# Patient Record
Sex: Female | Born: 1950 | Race: White | Hispanic: No | Marital: Married | Smoking: Never smoker
Health system: Southern US, Community
[De-identification: ages and names within clinical notes are randomized; demographics above are authoritative.]

## PROBLEM LIST (undated history)

## (undated) DIAGNOSIS — F329 Major depressive disorder, single episode, unspecified: Secondary | ICD-10-CM

## (undated) DIAGNOSIS — E785 Hyperlipidemia, unspecified: Secondary | ICD-10-CM

## (undated) DIAGNOSIS — I872 Venous insufficiency (chronic) (peripheral): Secondary | ICD-10-CM

## (undated) DIAGNOSIS — R51 Headache: Secondary | ICD-10-CM

## (undated) DIAGNOSIS — F32A Depression, unspecified: Secondary | ICD-10-CM

## (undated) DIAGNOSIS — R519 Headache, unspecified: Secondary | ICD-10-CM

## (undated) DIAGNOSIS — R32 Unspecified urinary incontinence: Secondary | ICD-10-CM

## (undated) DIAGNOSIS — D649 Anemia, unspecified: Secondary | ICD-10-CM

## (undated) DIAGNOSIS — T8859XA Other complications of anesthesia, initial encounter: Secondary | ICD-10-CM

## (undated) DIAGNOSIS — H409 Unspecified glaucoma: Secondary | ICD-10-CM

## (undated) DIAGNOSIS — K5909 Other constipation: Secondary | ICD-10-CM

## (undated) DIAGNOSIS — M8580 Other specified disorders of bone density and structure, unspecified site: Secondary | ICD-10-CM

## (undated) DIAGNOSIS — I209 Angina pectoris, unspecified: Secondary | ICD-10-CM

## (undated) DIAGNOSIS — R0600 Dyspnea, unspecified: Secondary | ICD-10-CM

## (undated) DIAGNOSIS — K219 Gastro-esophageal reflux disease without esophagitis: Secondary | ICD-10-CM

## (undated) DIAGNOSIS — T4145XA Adverse effect of unspecified anesthetic, initial encounter: Secondary | ICD-10-CM

## (undated) DIAGNOSIS — M25562 Pain in left knee: Secondary | ICD-10-CM

## (undated) DIAGNOSIS — I341 Nonrheumatic mitral (valve) prolapse: Secondary | ICD-10-CM

## (undated) DIAGNOSIS — E559 Vitamin D deficiency, unspecified: Secondary | ICD-10-CM

## (undated) HISTORY — PX: BLADDER SUSPENSION: SHX72

## (undated) HISTORY — PX: OTHER SURGICAL HISTORY: SHX169

## (undated) HISTORY — DX: Pain in left knee: M25.562

## (undated) HISTORY — DX: Anemia, unspecified: D64.9

## (undated) HISTORY — DX: Nonrheumatic mitral (valve) prolapse: I34.1

## (undated) HISTORY — DX: Other specified disorders of bone density and structure, unspecified site: M85.80

## (undated) HISTORY — DX: Depression, unspecified: F32.A

## (undated) HISTORY — DX: Other constipation: K59.09

## (undated) HISTORY — PX: APPENDECTOMY: SHX54

## (undated) HISTORY — DX: Hyperlipidemia, unspecified: E78.5

## (undated) HISTORY — DX: Venous insufficiency (chronic) (peripheral): I87.2

## (undated) HISTORY — PX: BACK SURGERY: SHX140

## (undated) HISTORY — DX: Vitamin D deficiency, unspecified: E55.9

## (undated) HISTORY — PX: ABDOMINAL HYSTERECTOMY: SHX81

## (undated) HISTORY — DX: Unspecified urinary incontinence: R32

## (undated) HISTORY — DX: Major depressive disorder, single episode, unspecified: F32.9

## (undated) HISTORY — DX: Unspecified glaucoma: H40.9

## (undated) HISTORY — PX: JOINT REPLACEMENT: SHX530

## (undated) HISTORY — PX: TUBAL LIGATION: SHX77

---

## 1969-12-04 HISTORY — PX: APPENDECTOMY: SHX54

## 1978-12-04 HISTORY — PX: TUBAL LIGATION: SHX77

## 1983-12-05 HISTORY — PX: ABDOMINAL HYSTERECTOMY: SHX81

## 1993-12-04 HISTORY — PX: LUMBAR DISC SURGERY: SHX700

## 2007-04-17 ENCOUNTER — Ambulatory Visit: Payer: Self-pay | Admitting: Gastroenterology

## 2008-04-02 ENCOUNTER — Ambulatory Visit: Payer: Self-pay | Admitting: Obstetrics and Gynecology

## 2008-05-12 ENCOUNTER — Encounter: Payer: Self-pay | Admitting: Unknown Physician Specialty

## 2008-06-03 ENCOUNTER — Encounter: Payer: Self-pay | Admitting: Unknown Physician Specialty

## 2009-04-07 ENCOUNTER — Ambulatory Visit: Payer: Self-pay | Admitting: Obstetrics and Gynecology

## 2009-04-15 ENCOUNTER — Ambulatory Visit: Payer: Self-pay | Admitting: Obstetrics and Gynecology

## 2009-10-19 ENCOUNTER — Ambulatory Visit: Payer: Self-pay | Admitting: Obstetrics and Gynecology

## 2010-04-21 ENCOUNTER — Ambulatory Visit: Payer: Self-pay | Admitting: Obstetrics and Gynecology

## 2011-05-02 ENCOUNTER — Ambulatory Visit: Payer: Self-pay | Admitting: Obstetrics and Gynecology

## 2012-05-14 ENCOUNTER — Ambulatory Visit: Payer: Self-pay | Admitting: Family Medicine

## 2012-06-14 ENCOUNTER — Ambulatory Visit: Payer: Self-pay | Admitting: Gastroenterology

## 2012-06-14 LAB — HM COLONOSCOPY: HM COLON: NORMAL

## 2012-11-07 ENCOUNTER — Ambulatory Visit: Payer: Self-pay | Admitting: Family Medicine

## 2012-11-07 LAB — HM DEXA SCAN

## 2012-12-27 ENCOUNTER — Ambulatory Visit: Payer: Self-pay | Admitting: Urology

## 2013-01-23 ENCOUNTER — Ambulatory Visit: Payer: Self-pay | Admitting: Pain Medicine

## 2013-02-06 ENCOUNTER — Ambulatory Visit: Payer: Self-pay | Admitting: Pain Medicine

## 2013-09-11 ENCOUNTER — Ambulatory Visit: Payer: Self-pay | Admitting: Pain Medicine

## 2013-10-01 ENCOUNTER — Ambulatory Visit: Payer: Self-pay | Admitting: Pain Medicine

## 2013-10-02 ENCOUNTER — Ambulatory Visit: Payer: Self-pay | Admitting: Pain Medicine

## 2013-10-20 ENCOUNTER — Ambulatory Visit: Payer: Self-pay | Admitting: Pain Medicine

## 2014-03-17 ENCOUNTER — Ambulatory Visit: Payer: Self-pay | Admitting: Family Medicine

## 2014-03-17 LAB — HM MAMMOGRAPHY: HM Mammogram: NORMAL

## 2014-09-14 DIAGNOSIS — M1710 Unilateral primary osteoarthritis, unspecified knee: Secondary | ICD-10-CM | POA: Insufficient documentation

## 2014-09-14 DIAGNOSIS — M179 Osteoarthritis of knee, unspecified: Secondary | ICD-10-CM | POA: Insufficient documentation

## 2015-04-05 ENCOUNTER — Other Ambulatory Visit: Payer: Self-pay

## 2015-04-05 DIAGNOSIS — Z1231 Encounter for screening mammogram for malignant neoplasm of breast: Secondary | ICD-10-CM

## 2015-04-07 LAB — LIPID PANEL
CHOLESTEROL: 261 mg/dL — AB (ref 0–200)
HDL: 70 mg/dL (ref 35–70)
LDL Cholesterol: 170 mg/dL
Triglycerides: 103 mg/dL (ref 40–160)

## 2015-04-22 ENCOUNTER — Ambulatory Visit
Admission: RE | Admit: 2015-04-22 | Discharge: 2015-04-22 | Disposition: A | Discharge status: Home / Self Care | Payer: Medicare Other | Source: Ambulatory Visit | Attending: Family Medicine | Admitting: Family Medicine

## 2015-04-22 DIAGNOSIS — Z1231 Encounter for screening mammogram for malignant neoplasm of breast: Secondary | ICD-10-CM | POA: Diagnosis not present

## 2015-04-28 ENCOUNTER — Other Ambulatory Visit: Payer: Self-pay | Admitting: Family Medicine

## 2015-04-28 DIAGNOSIS — E2839 Other primary ovarian failure: Secondary | ICD-10-CM

## 2015-05-11 ENCOUNTER — Other Ambulatory Visit: Payer: Self-pay | Admitting: Orthopedic Surgery

## 2015-05-11 DIAGNOSIS — M25462 Effusion, left knee: Secondary | ICD-10-CM

## 2015-05-11 DIAGNOSIS — M1712 Unilateral primary osteoarthritis, left knee: Secondary | ICD-10-CM

## 2015-05-18 ENCOUNTER — Telehealth: Payer: Self-pay | Admitting: Family Medicine

## 2015-05-18 NOTE — Telephone Encounter (Signed)
Patient was seen last month for her annual physical. She is still waiting on her referral for her bone density. Please return call [phone]

## 2015-05-18 NOTE — Telephone Encounter (Signed)
Notified patient Bone density is scheduled on 05/24/15 at 11:00am at [facility]

## 2015-05-19 ENCOUNTER — Ambulatory Visit
Admission: RE | Admit: 2015-05-19 | Discharge: 2015-05-19 | Disposition: A | Discharge status: Home / Self Care | Payer: Medicare Other | Source: Ambulatory Visit | Attending: Orthopedic Surgery | Admitting: Orthopedic Surgery

## 2015-05-19 ENCOUNTER — Ambulatory Visit: Payer: Medicare Other

## 2015-05-19 DIAGNOSIS — M25562 Pain in left knee: Secondary | ICD-10-CM | POA: Diagnosis present

## 2015-05-19 DIAGNOSIS — M1712 Unilateral primary osteoarthritis, left knee: Secondary | ICD-10-CM

## 2015-05-19 DIAGNOSIS — M25462 Effusion, left knee: Secondary | ICD-10-CM

## 2015-05-24 ENCOUNTER — Telehealth: Payer: Self-pay

## 2015-05-24 ENCOUNTER — Ambulatory Visit
Admission: RE | Admit: 2015-05-24 | Discharge: 2015-05-24 | Disposition: A | Discharge status: Home / Self Care | Payer: Medicare Other | Source: Ambulatory Visit | Attending: Family Medicine | Admitting: Family Medicine

## 2015-05-24 DIAGNOSIS — M8580 Other specified disorders of bone density and structure, unspecified site: Secondary | ICD-10-CM | POA: Diagnosis not present

## 2015-05-24 DIAGNOSIS — E2839 Other primary ovarian failure: Secondary | ICD-10-CM

## 2015-05-24 NOTE — Telephone Encounter (Signed)
-----   Message from Steele Sizer, MD sent at 05/24/2015  1:28 PM EDT ----- Stable osteopenia, continue medication

## 2015-05-24 NOTE — Telephone Encounter (Signed)
Patient notified

## 2015-06-03 ENCOUNTER — Ambulatory Visit
Admission: RE | Admit: 2015-06-03 | Discharge: 2015-06-03 | Disposition: A | Discharge status: Home / Self Care | Payer: Medicare Other | Source: Ambulatory Visit | Attending: Physician Assistant | Admitting: Physician Assistant

## 2015-06-03 ENCOUNTER — Other Ambulatory Visit: Payer: Self-pay | Admitting: Physician Assistant

## 2015-06-03 DIAGNOSIS — R059 Cough, unspecified: Secondary | ICD-10-CM

## 2015-06-03 DIAGNOSIS — R05 Cough: Secondary | ICD-10-CM

## 2015-06-25 ENCOUNTER — Other Ambulatory Visit: Payer: Self-pay

## 2015-06-27 MED ORDER — IBANDRONATE SODIUM 150 MG PO TABS: 150.0000 mg | ORAL_TABLET | ORAL | Status: DC

## 2015-09-16 ENCOUNTER — Encounter: Payer: Self-pay | Admitting: Physician Assistant

## 2015-09-16 ENCOUNTER — Ambulatory Visit: Payer: Self-pay | Admitting: Physician Assistant

## 2015-09-16 VITALS — BP 125/80 | HR 74 | Temp 97.8°F

## 2015-09-16 DIAGNOSIS — H6981 Other specified disorders of Eustachian tube, right ear: Secondary | ICD-10-CM

## 2015-09-16 DIAGNOSIS — Z299 Encounter for prophylactic measures, unspecified: Secondary | ICD-10-CM

## 2015-09-16 MED ORDER — FLUTICASONE PROPIONATE 50 MCG/ACT NA SUSP: 2.0000 | Freq: Every day | NASAL | Status: DC

## 2015-09-16 NOTE — Progress Notes (Signed)
S:  C/o right ear pain,  popping and being stopped up, no drainage from ears, no fever/chills, no cough or congestion, some sinus pressure, remainder ros neg Using otc meds without relief  O:  Vitals wnl, nad, tms dull b/l, nasal mucosa swollen, throat wnl, neck supple no lymph, lungs c t a, cv rrr, neuro intact  A: acute eustachean tube dysfunction  P: flonase, sudafed, flu vaccine given today, return if not improving in 3 to 5 days, return earlier if worsening, can call in antibiotic next week if worsening

## 2015-09-17 ENCOUNTER — Ambulatory Visit: Payer: Medicare Other | Admitting: Physician Assistant

## 2015-10-25 ENCOUNTER — Other Ambulatory Visit: Payer: Self-pay | Admitting: Emergency Medicine

## 2015-10-25 ENCOUNTER — Other Ambulatory Visit: Payer: Self-pay

## 2015-10-25 DIAGNOSIS — K219 Gastro-esophageal reflux disease without esophagitis: Secondary | ICD-10-CM

## 2015-10-25 NOTE — Telephone Encounter (Signed)
Received a faxed medication request from [facility].  Please advise.  Thank you. 

## 2015-10-25 NOTE — Telephone Encounter (Signed)
Patient requesting refill. 

## 2015-10-26 MED ORDER — OMEPRAZOLE 20 MG PO CPDR: 20.0000 mg | DELAYED_RELEASE_CAPSULE | Freq: Every day | ORAL | Status: DC

## 2015-10-26 NOTE — Telephone Encounter (Signed)
Med refill approved 

## 2015-10-27 MED ORDER — OXYBUTYNIN CHLORIDE ER 10 MG PO TB24: 10.0000 mg | ORAL_TABLET | Freq: Every day | ORAL | Status: DC

## 2015-10-27 NOTE — Telephone Encounter (Signed)
Appointment made for 11/17/15 and patient informed of prescription being sent to the pharmacy

## 2015-11-17 ENCOUNTER — Encounter: Payer: Self-pay | Admitting: Family Medicine

## 2015-11-17 ENCOUNTER — Ambulatory Visit (INDEPENDENT_AMBULATORY_CARE_PROVIDER_SITE_OTHER): Payer: Medicare Other | Admitting: Family Medicine

## 2015-11-17 VITALS — BP 104/68 | HR 88 | Temp 97.7°F | Resp 14 | Ht 62.0 in | Wt 152.7 lb

## 2015-11-17 DIAGNOSIS — K5900 Constipation, unspecified: Secondary | ICD-10-CM

## 2015-11-17 DIAGNOSIS — E785 Hyperlipidemia, unspecified: Secondary | ICD-10-CM | POA: Diagnosis not present

## 2015-11-17 DIAGNOSIS — N3941 Urge incontinence: Secondary | ICD-10-CM

## 2015-11-17 DIAGNOSIS — M1712 Unilateral primary osteoarthritis, left knee: Secondary | ICD-10-CM | POA: Diagnosis not present

## 2015-11-17 DIAGNOSIS — M8580 Other specified disorders of bone density and structure, unspecified site: Secondary | ICD-10-CM | POA: Insufficient documentation

## 2015-11-17 DIAGNOSIS — Z79899 Other long term (current) drug therapy: Secondary | ICD-10-CM

## 2015-11-17 DIAGNOSIS — M5432 Sciatica, left side: Secondary | ICD-10-CM | POA: Insufficient documentation

## 2015-11-17 DIAGNOSIS — E559 Vitamin D deficiency, unspecified: Secondary | ICD-10-CM | POA: Diagnosis not present

## 2015-11-17 DIAGNOSIS — K5909 Other constipation: Secondary | ICD-10-CM

## 2015-11-17 DIAGNOSIS — N62 Hypertrophy of breast: Secondary | ICD-10-CM | POA: Insufficient documentation

## 2015-11-17 DIAGNOSIS — I341 Nonrheumatic mitral (valve) prolapse: Secondary | ICD-10-CM | POA: Insufficient documentation

## 2015-11-17 DIAGNOSIS — G43009 Migraine without aura, not intractable, without status migrainosus: Secondary | ICD-10-CM | POA: Diagnosis not present

## 2015-11-17 DIAGNOSIS — Z1159 Encounter for screening for other viral diseases: Secondary | ICD-10-CM | POA: Diagnosis not present

## 2015-11-17 DIAGNOSIS — K219 Gastro-esophageal reflux disease without esophagitis: Secondary | ICD-10-CM

## 2015-11-17 DIAGNOSIS — H6980 Other specified disorders of Eustachian tube, unspecified ear: Secondary | ICD-10-CM | POA: Insufficient documentation

## 2015-11-17 DIAGNOSIS — Z981 Arthrodesis status: Secondary | ICD-10-CM | POA: Insufficient documentation

## 2015-11-17 DIAGNOSIS — I872 Venous insufficiency (chronic) (peripheral): Secondary | ICD-10-CM | POA: Insufficient documentation

## 2015-11-17 DIAGNOSIS — Z780 Asymptomatic menopausal state: Secondary | ICD-10-CM | POA: Insufficient documentation

## 2015-11-17 DIAGNOSIS — H6983 Other specified disorders of Eustachian tube, bilateral: Secondary | ICD-10-CM | POA: Diagnosis not present

## 2015-11-17 DIAGNOSIS — H6990 Unspecified Eustachian tube disorder, unspecified ear: Secondary | ICD-10-CM | POA: Insufficient documentation

## 2015-11-17 LAB — HM HEPATITIS C SCREENING LAB: HM Hepatitis Screen: NEGATIVE

## 2015-11-17 MED ORDER — DILTIAZEM HCL ER COATED BEADS 120 MG PO CP24: 120.0000 mg | ORAL_CAPSULE | Freq: Every day | ORAL | Status: DC

## 2015-11-17 MED ORDER — POLYETHYLENE GLYCOL 3350 17 GM/SCOOP PO POWD: 17.0000 g | Freq: Every day | ORAL | Status: DC

## 2015-11-17 MED ORDER — SUMATRIPTAN SUCCINATE 100 MG PO TABS: 100.0000 mg | ORAL_TABLET | ORAL | Status: DC | PRN

## 2015-11-17 NOTE — Progress Notes (Signed)
Name: Bonnie Owens   MRN: [id number]    DOB: January 09, 1951   Date:11/17/2015       Progress Note  Subjective  Chief Complaint  Chief Complaint  Patient presents with  . Medication Refill    follow-up  . Migraine    about 2 per month  . Gastroesophageal Reflux    doing good    HPI  MVP: she sees Dr. Ubaldo Glassing once a year. She is on Cardizem, she has constipation as a side effect. Denies palpitation, chest pain or SOB, recently, seldom she has symptoms when she is very tired, less than once a month.   Migraine without: two episodes past few weeks. Pain is described as throbbing on right frontal area, Imitrex works well for her but when she does not take it episode can last one day.   GERD: she went to employee health last month for indigestion and heartburn and was started on Omeprazole, she states she is doing well since started on medication. Reviewed with her possible side effects of long term use of PPI.   Primary OA left knee: causing more pain, currently on Meloxicam, may need surgery next year. Seeing Dr. Marry Guan. Pain is described as a toothache - dull / throbbing pain and occasionally radiates down her left leg.    Patient Active Problem List   Diagnosis Date Noted  . Osteopenia 11/17/2015  . Migraine without aura and without status migrainosus, not intractable 11/17/2015  . Urge urinary incontinence 11/17/2015  . Dyslipidemia 11/17/2015  . Large breasts 11/17/2015  . Menopause 11/17/2015  . Vitamin D deficiency 11/17/2015  . MVP (mitral valve prolapse) 11/17/2015  . History of lumbar fusion 11/17/2015  . Chronic constipation 11/17/2015  . Venous insufficiency 11/17/2015  . GERD (gastroesophageal reflux disease) 11/17/2015  . Sciatica of left side   . Arthritis of knee, degenerative 09/14/2014    Past Surgical History  Procedure Laterality Date  . Abdominal hysterectomy    . Tummy tuck    . Sling procedure    . Appendectomy    . Lumbar descectomy      Family  History  Problem Relation Age of Onset  . Breast cancer Sister 68  . Cancer Sister     breast  . Berenice Primas' disease Sister   . Breast cancer Maternal Grandmother   . Dementia Mother   . Cancer Father   . Thyroid disease Brother     Social History   Social History  . Marital Status: Married    Spouse Name: N/A  . Number of Children: N/A  . Years of Education: N/A   Occupational History  . Not on file.   Social History Main Topics  . Smoking status: Never Smoker   . Smokeless tobacco: Not on file  . Alcohol Use: 0.6 oz/week    0 Standard drinks or equivalent, 1 Glasses of wine per week  . Drug Use: Not on file  . Sexual Activity: Yes   Other Topics Concern  . Not on file   Social History Narrative     Current outpatient prescriptions:  .  polyethylene glycol powder (GLYCOLAX/MIRALAX) powder, Take 17 g by mouth daily., Disp: 850 g, Rfl: 5 .  diltiazem (CARDIZEM CD) 120 MG 24 hr capsule, Take 1 capsule (120 mg total) by mouth daily., Disp: 30 capsule, Rfl: 12 .  fluticasone (FLONASE) 50 MCG/ACT nasal spray, Place 2 sprays into both nostrils daily., Disp: 16 g, Rfl: 6 .  Glucosamine-Chondroitin 250-200 MG TABS, Take  1 tablet by mouth daily., Disp: , Rfl:  .  ibandronate (BONIVA) 150 MG tablet, Take 1 tablet (150 mg total) by mouth every 30 (thirty) days., Disp: 1 tablet, Rfl: 12 .  meloxicam (MOBIC) 15 MG tablet, Take 1 tablet by mouth daily., Disp: , Rfl:  .  Omega-3 Krill Oil 300 MG CAPS, Take 1 tablet by mouth daily., Disp: , Rfl:  .  omeprazole (PRILOSEC) 20 MG capsule, Take 1 capsule (20 mg total) by mouth daily., Disp: 30 capsule, Rfl: 6 .  oxybutynin (DITROPAN XL) 10 MG 24 hr tablet, Take 1 tablet (10 mg total) by mouth at bedtime., Disp: 30 tablet, Rfl: 5 .  SUMAtriptan (IMITREX) 100 MG tablet, Take 1 tablet (100 mg total) by mouth as needed., Disp: 10 tablet, Rfl: 5  No Known Allergies   ROS  Constitutional: Negative for fever , positive for  weight change.   Respiratory: Negative for cough and shortness of breath.   Cardiovascular: Negative for chest pain or palpitations.  Gastrointestinal: Negative for abdominal pain, no bowel changes - chronic constipation .  Musculoskeletal: Negative for gait problem or joint swelling.  Skin: Negative for rash.  Neurological: Negative for dizziness or headache.  No other specific complaints in a complete review of systems (except as listed in HPI above).  Objective  Filed Vitals:   11/17/15 0827  BP: 104/68  Pulse: 88  Temp: 97.7 F (36.5 C)  TempSrc: Oral  Resp: 14  Height: 5\' 2"  (1.575 m)  Weight: 152 lb 11.2 oz (69.264 kg)  SpO2: 97%    Body mass index is 27.92 kg/(m^2).  Physical Exam  Constitutional: Patient appears well-developed and well-nourished. Obese  No distress.  HEENT: head atraumatic, normocephalic, pupils equal and reactive to light, neck supple, throat within normal limits Cardiovascular: Normal rate, regular rhythm and normal heart sounds.  No murmur heard. No BLE edema. Pulmonary/Chest: Effort normal and breath sounds normal. No respiratory distress. Abdominal: Soft.  There is no tenderness. Psychiatric: Patient has a normal mood and affect. behavior is normal. Judgment and thought content normal. Muscular Skeletal: crepitus with extension of left knee, no effusion   PHQ2/9: Depression screen PHQ 2/9 11/17/2015  Decreased Interest 0  Down, Depressed, Hopeless 0  PHQ - 2 Score 0    Fall Risk: Fall Risk  11/17/2015  Falls in the past year? No   Functional Status Survey: Is the patient deaf or have difficulty hearing?: No Does the patient have difficulty seeing, even when wearing glasses/contacts?: Yes (contacts) Does the patient have difficulty concentrating, remembering, or making decisions?: No Does the patient have difficulty walking or climbing stairs?: No Does the patient have difficulty dressing or bathing?: No   Assessment & Plan  1. Migraine without  aura and without status migrainosus, not intractable  - diltiazem (CARDIZEM CD) 120 MG 24 hr capsule; Take 1 capsule (120 mg total) by mouth daily.  Dispense: 30 capsule; Refill: 12 - SUMAtriptan (IMITREX) 100 MG tablet; Take 1 tablet (100 mg total) by mouth as needed.  Dispense: 10 tablet; Refill: 5  2. Chronic constipation  - polyethylene glycol powder (GLYCOLAX/MIRALAX) powder; Take 17 g by mouth daily.  Dispense: 850 g; Refill: 5  3. Primary osteoarthritis of left knee  Continue follow up with Dr. Marry Guan  4. Dyslipidemia  Discussed replacing fish oil, with eating fish twice weekly, eating tree nuts about 100 g every other day - Lipid panel  5. Urge urinary incontinence  stable  6. Vitamin D deficiency  -  VITAMIN D 25 Hydroxy (Vit-D Deficiency, Fractures)  7. Gastroesophageal reflux disease without esophagitis  Discussed life style modification   8. Need for hepatitis C screening test  - Hepatitis C antibody  9. Encounter for long-term (current) use of medications  - Comprehensive metabolic panel

## 2015-11-17 NOTE — Patient Instructions (Signed)
Food Choices for Gastroesophageal Reflux Disease, Adult When you have gastroesophageal reflux disease (GERD), the foods you eat and your eating habits are very important. Choosing the right foods can help ease the discomfort of GERD. WHAT GENERAL GUIDELINES DO I NEED TO FOLLOW?  Choose fruits, vegetables, whole grains, low-fat dairy products, and low-fat meat, fish, and poultry.  Limit fats such as oils, salad dressings, butter, nuts, and avocado.  Keep a food diary to identify foods that cause symptoms.  Avoid foods that cause reflux. These may be different for different people.  Eat frequent small meals instead of three large meals each day.  Eat your meals slowly, in a relaxed setting.  Limit fried foods.  Cook foods using methods other than frying.  Avoid drinking alcohol.  Avoid drinking large amounts of liquids with your meals.  Avoid bending over or lying down until 2-3 hours after eating. WHAT FOODS ARE NOT RECOMMENDED? The following are some foods and drinks that may worsen your symptoms: Vegetables Tomatoes. Tomato juice. Tomato and spaghetti sauce. Chili peppers. Onion and garlic. Horseradish. Fruits Oranges, grapefruit, and lemon (fruit and juice). Meats High-fat meats, fish, and poultry. This includes hot dogs, ribs, ham, sausage, salami, and bacon. Dairy Whole milk and chocolate milk. Sour cream. Cream. Butter. Ice cream. Cream cheese.  Beverages Coffee and tea, with or without caffeine. Carbonated beverages or energy drinks. Condiments Hot sauce. Barbecue sauce.  Sweets/Desserts Chocolate and cocoa. Donuts. Peppermint and spearmint. Fats and Oils High-fat foods, including French fries and potato chips. Other Vinegar. Strong spices, such as black pepper, white pepper, red pepper, cayenne, curry powder, cloves, ginger, and chili powder. The items listed above may not be a complete list of foods and beverages to avoid. Contact your dietitian for more  information.   This information is not intended to replace advice given to you by your health care provider. Make sure you discuss any questions you have with your health care provider.   Document Released: 11/20/2005 Document Revised: 12/11/2014 Document Reviewed: 09/24/2013 Elsevier Interactive Patient Education 2016 Elsevier Inc.  

## 2015-11-18 LAB — LIPID PANEL
CHOLESTEROL TOTAL: 242 mg/dL — AB (ref 100–199)
Chol/HDL Ratio: 4 ratio units (ref 0.0–4.4)
HDL: 61 mg/dL (ref >39)
LDL Calculated: 159 mg/dL — ABNORMAL HIGH (ref 0–99)
TRIGLYCERIDES: 111 mg/dL (ref 0–149)
VLDL Cholesterol Cal: 22 mg/dL (ref 5–40)

## 2015-11-18 LAB — COMPREHENSIVE METABOLIC PANEL
A/G RATIO: 1.8 (ref 1.1–2.5)
ALBUMIN: 4.4 g/dL (ref 3.6–4.8)
ALT: 21 IU/L (ref 0–32)
AST: 22 IU/L (ref 0–40)
Alkaline Phosphatase: 53 IU/L (ref 39–117)
BUN / CREAT RATIO: 33 — AB (ref 11–26)
BUN: 20 mg/dL (ref 8–27)
Bilirubin Total: 0.4 mg/dL (ref 0.0–1.2)
CALCIUM: 9.2 mg/dL (ref 8.7–10.3)
CO2: 26 mmol/L (ref 18–29)
Chloride: 98 mmol/L (ref 96–106)
Creatinine, Ser: 0.6 mg/dL (ref 0.57–1.00)
GFR, EST AFRICAN AMERICAN: 111 mL/min/{1.73_m2} (ref >59)
GFR, EST NON AFRICAN AMERICAN: 96 mL/min/{1.73_m2} (ref >59)
GLOBULIN, TOTAL: 2.4 g/dL (ref 1.5–4.5)
Glucose: 83 mg/dL (ref 65–99)
POTASSIUM: 4.6 mmol/L (ref 3.5–5.2)
SODIUM: 139 mmol/L (ref 134–144)
TOTAL PROTEIN: 6.8 g/dL (ref 6.0–8.5)

## 2015-11-18 LAB — HEPATITIS C ANTIBODY: Hep C Virus Ab: 0.1 s/co ratio (ref 0.0–0.9)

## 2015-11-18 LAB — VITAMIN D 25 HYDROXY (VIT D DEFICIENCY, FRACTURES): Vit D, 25-Hydroxy: 38.1 ng/mL (ref 30.0–100.0)

## 2016-01-10 ENCOUNTER — Encounter: Payer: Self-pay | Admitting: Physician Assistant

## 2016-01-10 ENCOUNTER — Ambulatory Visit: Payer: Self-pay | Admitting: Physician Assistant

## 2016-01-10 VITALS — BP 110/70 | HR 83 | Temp 98.1°F

## 2016-01-10 DIAGNOSIS — J018 Other acute sinusitis: Secondary | ICD-10-CM

## 2016-01-10 MED ORDER — AMOXICILLIN 875 MG PO TABS: 875.0000 mg | ORAL_TABLET | Freq: Two times a day (BID) | ORAL | Status: DC

## 2016-01-10 NOTE — Progress Notes (Signed)
S: C/o runny nose and congestion for 3 days, no fever, chills, cp/sob, v/d; mucus is white and thick, cough is sporadic, c/o of facial and dental pain. Some body aches over the weekend but better now Using otc meds:   O: PE: vitals wnl, nad, perrl eomi, normocephalic, tms dull, nasal mucosa red and swollen, throat injected, neck supple no lymph, lungs c t a, cv rrr, neuro intact  A:  Acute sinusitis   P: amoxil 875mg  bid x 10d, drink fluids, continue regular meds , use otc meds of choice, return if not improving in 5 days, return earlier if worsening

## 2016-02-03 DIAGNOSIS — M1712 Unilateral primary osteoarthritis, left knee: Secondary | ICD-10-CM | POA: Diagnosis not present

## 2016-02-07 ENCOUNTER — Other Ambulatory Visit: Payer: Self-pay | Admitting: Orthopedic Surgery

## 2016-02-07 DIAGNOSIS — M1712 Unilateral primary osteoarthritis, left knee: Secondary | ICD-10-CM

## 2016-02-09 ENCOUNTER — Ambulatory Visit
Admission: RE | Admit: 2016-02-09 | Discharge: 2016-02-09 | Disposition: A | Discharge status: Home / Self Care | Payer: Managed Care, Other (non HMO) | Source: Ambulatory Visit | Attending: Orthopedic Surgery | Admitting: Orthopedic Surgery

## 2016-02-09 DIAGNOSIS — M1612 Unilateral primary osteoarthritis, left hip: Secondary | ICD-10-CM | POA: Insufficient documentation

## 2016-02-09 DIAGNOSIS — M1712 Unilateral primary osteoarthritis, left knee: Secondary | ICD-10-CM | POA: Diagnosis not present

## 2016-02-09 DIAGNOSIS — M25562 Pain in left knee: Secondary | ICD-10-CM | POA: Diagnosis not present

## 2016-02-21 DIAGNOSIS — M1712 Unilateral primary osteoarthritis, left knee: Secondary | ICD-10-CM | POA: Diagnosis not present

## 2016-02-21 DIAGNOSIS — S8001XA Contusion of right knee, initial encounter: Secondary | ICD-10-CM | POA: Diagnosis not present

## 2016-02-23 ENCOUNTER — Encounter
Admission: RE | Admit: 2016-02-23 | Discharge: 2016-02-23 | Disposition: A | Discharge status: Home / Self Care | Payer: Managed Care, Other (non HMO) | Source: Ambulatory Visit | Attending: Orthopedic Surgery | Admitting: Orthopedic Surgery

## 2016-02-23 DIAGNOSIS — M1712 Unilateral primary osteoarthritis, left knee: Secondary | ICD-10-CM | POA: Diagnosis not present

## 2016-02-23 DIAGNOSIS — Z01812 Encounter for preprocedural laboratory examination: Secondary | ICD-10-CM | POA: Diagnosis not present

## 2016-02-23 HISTORY — DX: Headache: R51

## 2016-02-23 HISTORY — DX: Gastro-esophageal reflux disease without esophagitis: K21.9

## 2016-02-23 HISTORY — DX: Headache, unspecified: R51.9

## 2016-02-23 LAB — ABO/RH: ABO/RH(D): A NEG

## 2016-02-23 LAB — URINALYSIS COMPLETE WITH MICROSCOPIC (ARMC ONLY)
BACTERIA UA: NONE SEEN
Bilirubin Urine: NEGATIVE
Glucose, UA: NEGATIVE mg/dL
Hgb urine dipstick: NEGATIVE
Ketones, ur: NEGATIVE mg/dL
Leukocytes, UA: NEGATIVE
Nitrite: NEGATIVE
PROTEIN: NEGATIVE mg/dL
SPECIFIC GRAVITY, URINE: 1.01 (ref 1.005–1.030)
SQUAMOUS EPITHELIAL / LPF: NONE SEEN
pH: 6 (ref 5.0–8.0)

## 2016-02-23 LAB — CBC
HEMATOCRIT: 44.4 % (ref 35.0–47.0)
HEMOGLOBIN: 14.8 g/dL (ref 12.0–16.0)
MCH: 31.4 pg (ref 26.0–34.0)
MCHC: 33.3 g/dL (ref 32.0–36.0)
MCV: 94.4 fL (ref 80.0–100.0)
Platelets: 344 10*3/uL (ref 150–440)
RBC: 4.71 MIL/uL (ref 3.80–5.20)
RDW: 12.9 % (ref 11.5–14.5)
WBC: 7 10*3/uL (ref 3.6–11.0)

## 2016-02-23 LAB — TYPE AND SCREEN
ABO/RH(D): A NEG
ANTIBODY SCREEN: NEGATIVE

## 2016-02-23 LAB — SEDIMENTATION RATE: Sed Rate: 9 mm/hr (ref 0–30)

## 2016-02-23 LAB — APTT: aPTT: 32 seconds (ref 24–36)

## 2016-02-23 LAB — BASIC METABOLIC PANEL
Anion gap: 6 (ref 5–15)
BUN: 16 mg/dL (ref 6–20)
CHLORIDE: 102 mmol/L (ref 101–111)
CO2: 29 mmol/L (ref 22–32)
CREATININE: 0.48 mg/dL (ref 0.44–1.00)
Calcium: 9.1 mg/dL (ref 8.9–10.3)
GFR calc non Af Amer: >60 mL/min (ref >60)
Glucose, Bld: 87 mg/dL (ref 65–99)
POTASSIUM: 3.7 mmol/L (ref 3.5–5.1)
SODIUM: 137 mmol/L (ref 135–145)

## 2016-02-23 LAB — PROTIME-INR
INR: 0.96
PROTHROMBIN TIME: 13 s (ref 11.4–15.0)

## 2016-02-23 LAB — SURGICAL PCR SCREEN
MRSA, PCR: NEGATIVE
STAPHYLOCOCCUS AUREUS: POSITIVE — AB

## 2016-02-23 NOTE — Patient Instructions (Signed)
  Your procedure is scheduled on: Thursday 03/09/16 Report to Day Surgery. 2ND FLOOR MEDICAL MALL ENTRANCE To find out your arrival time please call [phone] between 1PM - 3PM on Wednesday 03/08/16.  Remember: Instructions that are not followed completely may result in serious medical risk, up to and including death, or upon the discretion of your surgeon and anesthesiologist your surgery may need to be rescheduled.    __X__ 1. Do not eat food or drink liquids after midnight. No gum chewing or hard candies.     __X__ 2. No Alcohol for 24 hours before or after surgery.   ____ 3. Bring all medications with you on the day of surgery if instructed.    __X__ 4. Notify your doctor if there is any change in your medical condition     (cold, fever, infections).     Do not wear jewelry, make-up, hairpins, clips or nail polish.  Do not wear lotions, powders, or perfumes.   Do not shave 48 hours prior to surgery. Men may shave face and neck.  Do not bring valuables to the hospital.    [facility] is not responsible for any belongings or valuables.               Contacts, dentures or bridgework may not be worn into surgery.  Leave your suitcase in the car. After surgery it may be brought to your room.  For patients admitted to the hospital, discharge time is determined by your                treatment team.   Patients discharged the day of surgery will not be allowed to drive home.   Please read over the following fact sheets that you were given:   MRSA Information and Surgical Site Infection Prevention   __X__ Take these medicines the morning of surgery with A SIP OF WATER:    1. DILTIAZEM  2. OMEPRAZOLE  3.   4.  5.  6.  ____ Fleet Enema (as directed)   __X__ Use CHG Soap as directed  ____ Use inhalers on the day of surgery  ____ Stop metformin 2 days prior to surgery    ____ Take 1/2 of usual insulin dose the night before surgery and none on the morning of surgery.   __X__  Stop Coumadin/Plavix/aspirin on CALL DR FATH IN REGARDS TO STOPPING YOUR ASPIRIN 10 DAYS PRIOR TO SURGERY  ____ Stop Anti-inflammatories on    __X__ Stop supplements until after surgery. 7 DAYS PRIOR TO SURGERY OSTEO BIFLEX, VITAMIN C  ____ Bring C-Pap to the hospital.

## 2016-02-24 NOTE — Pre-Procedure Instructions (Signed)
Faxed UA PCR results to Dr. Rudene Christians.

## 2016-02-25 LAB — URINE CULTURE: CULTURE: NO GROWTH

## 2016-03-09 ENCOUNTER — Inpatient Hospital Stay: Payer: Managed Care, Other (non HMO) | Admitting: Anesthesiology

## 2016-03-09 ENCOUNTER — Inpatient Hospital Stay
Admission: RE | Admit: 2016-03-09 | Discharge: 2016-03-12 | DRG: 470 | Disposition: A | Discharge status: Home Health | Payer: Managed Care, Other (non HMO) | Source: Ambulatory Visit | Attending: Orthopedic Surgery | Admitting: Orthopedic Surgery

## 2016-03-09 ENCOUNTER — Encounter: Payer: Self-pay | Admitting: *Deleted

## 2016-03-09 ENCOUNTER — Inpatient Hospital Stay: Payer: Managed Care, Other (non HMO)

## 2016-03-09 ENCOUNTER — Encounter
Admission: RE | Disposition: A | Discharge status: Home Health | Payer: Self-pay | Source: Ambulatory Visit | Attending: Orthopedic Surgery

## 2016-03-09 DIAGNOSIS — I872 Venous insufficiency (chronic) (peripheral): Secondary | ICD-10-CM | POA: Diagnosis present

## 2016-03-09 DIAGNOSIS — M8580 Other specified disorders of bone density and structure, unspecified site: Secondary | ICD-10-CM | POA: Diagnosis present

## 2016-03-09 DIAGNOSIS — K5909 Other constipation: Secondary | ICD-10-CM | POA: Diagnosis not present

## 2016-03-09 DIAGNOSIS — E871 Hypo-osmolality and hyponatremia: Secondary | ICD-10-CM | POA: Diagnosis present

## 2016-03-09 DIAGNOSIS — E785 Hyperlipidemia, unspecified: Secondary | ICD-10-CM | POA: Diagnosis not present

## 2016-03-09 DIAGNOSIS — Z7982 Long term (current) use of aspirin: Secondary | ICD-10-CM | POA: Diagnosis not present

## 2016-03-09 DIAGNOSIS — I10 Essential (primary) hypertension: Secondary | ICD-10-CM | POA: Diagnosis not present

## 2016-03-09 DIAGNOSIS — M179 Osteoarthritis of knee, unspecified: Secondary | ICD-10-CM | POA: Diagnosis not present

## 2016-03-09 DIAGNOSIS — K219 Gastro-esophageal reflux disease without esophagitis: Secondary | ICD-10-CM | POA: Diagnosis not present

## 2016-03-09 DIAGNOSIS — G8918 Other acute postprocedural pain: Secondary | ICD-10-CM

## 2016-03-09 DIAGNOSIS — Z471 Aftercare following joint replacement surgery: Secondary | ICD-10-CM | POA: Diagnosis not present

## 2016-03-09 DIAGNOSIS — Z96652 Presence of left artificial knee joint: Secondary | ICD-10-CM | POA: Insufficient documentation

## 2016-03-09 DIAGNOSIS — E559 Vitamin D deficiency, unspecified: Secondary | ICD-10-CM | POA: Diagnosis present

## 2016-03-09 DIAGNOSIS — K589 Irritable bowel syndrome without diarrhea: Secondary | ICD-10-CM | POA: Diagnosis present

## 2016-03-09 DIAGNOSIS — M1712 Unilateral primary osteoarthritis, left knee: Secondary | ICD-10-CM | POA: Diagnosis not present

## 2016-03-09 DIAGNOSIS — Z79899 Other long term (current) drug therapy: Secondary | ICD-10-CM | POA: Diagnosis not present

## 2016-03-09 DIAGNOSIS — M1990 Unspecified osteoarthritis, unspecified site: Secondary | ICD-10-CM | POA: Diagnosis not present

## 2016-03-09 DIAGNOSIS — I739 Peripheral vascular disease, unspecified: Secondary | ICD-10-CM | POA: Diagnosis not present

## 2016-03-09 DIAGNOSIS — Z9181 History of falling: Secondary | ICD-10-CM | POA: Diagnosis not present

## 2016-03-09 DIAGNOSIS — M25562 Pain in left knee: Secondary | ICD-10-CM | POA: Diagnosis present

## 2016-03-09 HISTORY — DX: Other complications of anesthesia, initial encounter: T88.59XA

## 2016-03-09 HISTORY — PX: TOTAL KNEE ARTHROPLASTY: SHX125

## 2016-03-09 HISTORY — DX: Adverse effect of unspecified anesthetic, initial encounter: T41.45XA

## 2016-03-09 LAB — TYPE AND SCREEN
ABO/RH(D): A NEG
Antibody Screen: NEGATIVE

## 2016-03-09 LAB — CBC
HCT: 36 % (ref 35.0–47.0)
HEMOGLOBIN: 12.4 g/dL (ref 12.0–16.0)
MCH: 31.6 pg (ref 26.0–34.0)
MCHC: 34.4 g/dL (ref 32.0–36.0)
MCV: 91.9 fL (ref 80.0–100.0)
Platelets: 266 10*3/uL (ref 150–440)
RBC: 3.92 MIL/uL (ref 3.80–5.20)
RDW: 12.7 % (ref 11.5–14.5)
WBC: 8.3 10*3/uL (ref 3.6–11.0)

## 2016-03-09 LAB — CREATININE, SERUM
CREATININE: 0.43 mg/dL — AB (ref 0.44–1.00)
GFR calc Af Amer: >60 mL/min (ref >60)

## 2016-03-09 SURGERY — ARTHROPLASTY, KNEE, TOTAL
Anesthesia: Spinal | Site: Knee | Laterality: Left | Wound class: Clean

## 2016-03-09 MED ORDER — MAGNESIUM HYDROXIDE 400 MG/5ML PO SUSP
30.0000 mL | Freq: Every day | ORAL | Status: DC | PRN
  Administered 2016-03-10 – 2016-03-11 (×2): 30 mL via ORAL
  Filled 2016-03-09 (×2): qty 30

## 2016-03-09 MED ORDER — ACETAMINOPHEN 325 MG PO TABS
650.0000 mg | ORAL_TABLET | Freq: Four times a day (QID) | ORAL | Status: DC | PRN
Start: 2016-03-10 — End: 2016-03-12
  Administered 2016-03-12: 650 mg via ORAL
  Filled 2016-03-09: qty 2

## 2016-03-09 MED ORDER — BUPIVACAINE LIPOSOME 1.3 % IJ SUSP
INTRAMUSCULAR | Status: AC
  Filled 2016-03-09: qty 20

## 2016-03-09 MED ORDER — PANTOPRAZOLE SODIUM 40 MG PO TBEC
40.0000 mg | DELAYED_RELEASE_TABLET | Freq: Every day | ORAL | Status: DC
  Administered 2016-03-10 – 2016-03-12 (×3): 40 mg via ORAL
  Filled 2016-03-09 (×3): qty 1

## 2016-03-09 MED ORDER — PHENYLEPHRINE HCL 10 MG/ML IJ SOLN
INTRAMUSCULAR | Status: DC | PRN
  Administered 2016-03-09 (×3): 100 ug via INTRAVENOUS

## 2016-03-09 MED ORDER — BISACODYL 10 MG RE SUPP
10.0000 mg | Freq: Every day | RECTAL | Status: DC | PRN
  Administered 2016-03-12: 10 mg via RECTAL
  Filled 2016-03-09: qty 1

## 2016-03-09 MED ORDER — METHOCARBAMOL 1000 MG/10ML IJ SOLN
500.0000 mg | Freq: Four times a day (QID) | INTRAVENOUS | Status: DC | PRN
  Filled 2016-03-09: qty 5

## 2016-03-09 MED ORDER — SODIUM CHLORIDE 0.9 % IJ SOLN
INTRAMUSCULAR | Status: AC
  Filled 2016-03-09: qty 100

## 2016-03-09 MED ORDER — OXYBUTYNIN CHLORIDE ER 5 MG PO TB24
10.0000 mg | ORAL_TABLET | Freq: Every day | ORAL | Status: DC
  Administered 2016-03-10 – 2016-03-12 (×3): 10 mg via ORAL
  Filled 2016-03-09 (×3): qty 2

## 2016-03-09 MED ORDER — ONDANSETRON HCL 4 MG/2ML IJ SOLN
4.0000 mg | Freq: Four times a day (QID) | INTRAMUSCULAR | Status: DC | PRN
  Administered 2016-03-10: 4 mg via INTRAVENOUS
  Filled 2016-03-09: qty 2

## 2016-03-09 MED ORDER — PHENOL 1.4 % MT LIQD
1.0000 | OROMUCOSAL | Status: DC | PRN
  Filled 2016-03-09: qty 177

## 2016-03-09 MED ORDER — KETOROLAC TROMETHAMINE 30 MG/ML IJ SOLN
INTRAMUSCULAR | Status: DC | PRN
  Administered 2016-03-09: 30 mg

## 2016-03-09 MED ORDER — BUPIVACAINE-EPINEPHRINE (PF) 0.25% -1:200000 IJ SOLN
INTRAMUSCULAR | Status: AC
  Filled 2016-03-09: qty 30

## 2016-03-09 MED ORDER — FLUTICASONE PROPIONATE 50 MCG/ACT NA SUSP
2.0000 | Freq: Every day | NASAL | Status: DC | PRN
  Filled 2016-03-09: qty 16

## 2016-03-09 MED ORDER — NEOMYCIN-POLYMYXIN B GU 40-200000 IR SOLN
Status: AC
  Filled 2016-03-09: qty 20

## 2016-03-09 MED ORDER — DOCUSATE SODIUM 100 MG PO CAPS
100.0000 mg | ORAL_CAPSULE | Freq: Two times a day (BID) | ORAL | Status: DC
  Administered 2016-03-09 – 2016-03-12 (×6): 100 mg via ORAL
  Filled 2016-03-09 (×6): qty 1

## 2016-03-09 MED ORDER — MORPHINE SULFATE 10 MG/ML IJ SOLN
INTRAMUSCULAR | Status: DC | PRN
  Administered 2016-03-09: 10 mg

## 2016-03-09 MED ORDER — SODIUM CHLORIDE FLUSH 0.9 % IV SOLN
INTRAVENOUS | Status: AC
  Filled 2016-03-09: qty 10

## 2016-03-09 MED ORDER — VITAMIN C 500 MG PO TABS
1000.0000 mg | ORAL_TABLET | Freq: Every day | ORAL | Status: DC
  Administered 2016-03-10 – 2016-03-12 (×3): 1000 mg via ORAL
  Filled 2016-03-09 (×3): qty 2

## 2016-03-09 MED ORDER — METHOCARBAMOL 500 MG PO TABS
500.0000 mg | ORAL_TABLET | Freq: Four times a day (QID) | ORAL | Status: DC | PRN
  Administered 2016-03-09 – 2016-03-10 (×2): 500 mg via ORAL
  Filled 2016-03-09 (×2): qty 1

## 2016-03-09 MED ORDER — METOCLOPRAMIDE HCL 5 MG/ML IJ SOLN: 5.0000 mg | Freq: Three times a day (TID) | INTRAMUSCULAR | Status: DC | PRN

## 2016-03-09 MED ORDER — CEFAZOLIN SODIUM-DEXTROSE 2-4 GM/100ML-% IV SOLN
2.0000 g | Freq: Once | INTRAVENOUS | Status: AC
  Administered 2016-03-09: 2 g via INTRAVENOUS

## 2016-03-09 MED ORDER — MAGNESIUM CITRATE PO SOLN
1.0000 | Freq: Once | ORAL | Status: DC | PRN
  Filled 2016-03-09: qty 296

## 2016-03-09 MED ORDER — VITAMIN D 1000 UNITS PO TABS
5000.0000 [IU] | ORAL_TABLET | Freq: Every day | ORAL | Status: DC
  Administered 2016-03-10 – 2016-03-12 (×3): 5000 [IU] via ORAL
  Filled 2016-03-09 (×5): qty 5

## 2016-03-09 MED ORDER — SODIUM CHLORIDE 0.9 % IV SOLN
INTRAVENOUS | Status: DC | PRN
  Administered 2016-03-09: 60 mL

## 2016-03-09 MED ORDER — NEOMYCIN-POLYMYXIN B GU 40-200000 IR SOLN
Status: DC | PRN
  Administered 2016-03-09: 16 mL

## 2016-03-09 MED ORDER — MORPHINE SULFATE (PF) 2 MG/ML IV SOLN
2.0000 mg | INTRAVENOUS | Status: DC | PRN
  Administered 2016-03-09 – 2016-03-10 (×4): 2 mg via INTRAVENOUS
  Filled 2016-03-09 (×4): qty 1

## 2016-03-09 MED ORDER — BUPIVACAINE HCL (PF) 0.5 % IJ SOLN
INTRAMUSCULAR | Status: DC | PRN
  Administered 2016-03-09: 3 mL

## 2016-03-09 MED ORDER — ALUM & MAG HYDROXIDE-SIMETH 200-200-20 MG/5ML PO SUSP: 30.0000 mL | ORAL | Status: DC | PRN

## 2016-03-09 MED ORDER — OMEGA-3 KRILL OIL 300 MG PO CAPS: 300.0000 mg | ORAL_CAPSULE | Freq: Every day | ORAL | Status: DC

## 2016-03-09 MED ORDER — BUPIVACAINE-EPINEPHRINE (PF) 0.25% -1:200000 IJ SOLN
INTRAMUSCULAR | Status: DC | PRN
  Administered 2016-03-09: 30 mL

## 2016-03-09 MED ORDER — CEFAZOLIN SODIUM-DEXTROSE 2-4 GM/100ML-% IV SOLN
2.0000 g | Freq: Four times a day (QID) | INTRAVENOUS | Status: AC
Start: 2016-03-09 — End: 2016-03-09
  Administered 2016-03-09 (×2): 2 g via INTRAVENOUS
  Filled 2016-03-09 (×2): qty 100

## 2016-03-09 MED ORDER — SODIUM CHLORIDE 0.9 % IV SOLN
INTRAVENOUS | Status: DC
  Administered 2016-03-09 (×2): via INTRAVENOUS

## 2016-03-09 MED ORDER — KETAMINE HCL 50 MG/ML IJ SOLN
INTRAMUSCULAR | Status: DC | PRN
  Administered 2016-03-09: 50 mg via INTRAMUSCULAR

## 2016-03-09 MED ORDER — DIPHENHYDRAMINE HCL 12.5 MG/5ML PO ELIX: 12.5000 mg | ORAL_SOLUTION | ORAL | Status: DC | PRN

## 2016-03-09 MED ORDER — FENTANYL CITRATE (PF) 100 MCG/2ML IJ SOLN: 25.0000 ug | INTRAMUSCULAR | Status: DC | PRN

## 2016-03-09 MED ORDER — ACETAMINOPHEN 650 MG RE SUPP: 650.0000 mg | Freq: Four times a day (QID) | RECTAL | Status: DC | PRN

## 2016-03-09 MED ORDER — MENTHOL 3 MG MT LOZG
1.0000 | LOZENGE | OROMUCOSAL | Status: DC | PRN
  Filled 2016-03-09: qty 9

## 2016-03-09 MED ORDER — ASPIRIN EC 81 MG PO TBEC
81.0000 mg | DELAYED_RELEASE_TABLET | Freq: Every day | ORAL | Status: DC
  Administered 2016-03-10 – 2016-03-12 (×3): 81 mg via ORAL
  Filled 2016-03-09 (×3): qty 1

## 2016-03-09 MED ORDER — ZOLPIDEM TARTRATE 5 MG PO TABS: 5.0000 mg | ORAL_TABLET | Freq: Every evening | ORAL | Status: DC | PRN

## 2016-03-09 MED ORDER — TRANEXAMIC ACID 1000 MG/10ML IV SOLN
1000.0000 mg | INTRAVENOUS | Status: AC
  Administered 2016-03-09: 1000 mg via INTRAVENOUS
  Filled 2016-03-09: qty 10

## 2016-03-09 MED ORDER — DILTIAZEM HCL ER COATED BEADS 120 MG PO CP24
120.0000 mg | ORAL_CAPSULE | Freq: Every day | ORAL | Status: DC
  Administered 2016-03-10 – 2016-03-12 (×3): 120 mg via ORAL
  Filled 2016-03-09 (×3): qty 1

## 2016-03-09 MED ORDER — ACETAMINOPHEN 500 MG PO TABS
1000.0000 mg | ORAL_TABLET | Freq: Four times a day (QID) | ORAL | Status: AC
  Administered 2016-03-09 – 2016-03-10 (×4): 1000 mg via ORAL
  Filled 2016-03-09 (×4): qty 2

## 2016-03-09 MED ORDER — ONDANSETRON HCL 4 MG PO TABS: 4.0000 mg | ORAL_TABLET | Freq: Four times a day (QID) | ORAL | Status: DC | PRN

## 2016-03-09 MED ORDER — POLYETHYLENE GLYCOL 3350 17 G PO PACK
17.0000 g | PACK | Freq: Every day | ORAL | Status: DC
  Administered 2016-03-11 – 2016-03-12 (×2): 17 g via ORAL
  Filled 2016-03-09 (×2): qty 1

## 2016-03-09 MED ORDER — MIDAZOLAM HCL 5 MG/5ML IJ SOLN
INTRAMUSCULAR | Status: DC | PRN
  Administered 2016-03-09: 2 mg via INTRAVENOUS

## 2016-03-09 MED ORDER — METOCLOPRAMIDE HCL 10 MG PO TABS: 5.0000 mg | ORAL_TABLET | Freq: Three times a day (TID) | ORAL | Status: DC | PRN

## 2016-03-09 MED ORDER — MORPHINE SULFATE (PF) 10 MG/ML IV SOLN
INTRAVENOUS | Status: AC
  Filled 2016-03-09: qty 1

## 2016-03-09 MED ORDER — OXYCODONE HCL 5 MG PO TABS
5.0000 mg | ORAL_TABLET | ORAL | Status: DC | PRN
  Administered 2016-03-09 (×3): 5 mg via ORAL
  Administered 2016-03-10: 10 mg via ORAL
  Administered 2016-03-10 (×2): 5 mg via ORAL
  Administered 2016-03-10 (×3): 10 mg via ORAL
  Administered 2016-03-10: 5 mg via ORAL
  Administered 2016-03-11 (×4): 10 mg via ORAL
  Administered 2016-03-12 (×2): 5 mg via ORAL
  Filled 2016-03-09 (×2): qty 2
  Filled 2016-03-09 (×2): qty 1
  Filled 2016-03-09: qty 2
  Filled 2016-03-09 (×2): qty 1
  Filled 2016-03-09: qty 2
  Filled 2016-03-09: qty 1
  Filled 2016-03-09 (×4): qty 2
  Filled 2016-03-09: qty 1
  Filled 2016-03-09: qty 2
  Filled 2016-03-09: qty 1

## 2016-03-09 MED ORDER — PROPOFOL 500 MG/50ML IV EMUL
INTRAVENOUS | Status: DC | PRN
  Administered 2016-03-09: 75 ug/kg/min via INTRAVENOUS

## 2016-03-09 MED ORDER — LACTATED RINGERS IV SOLN
INTRAVENOUS | Status: DC
  Administered 2016-03-09 (×3): via INTRAVENOUS

## 2016-03-09 MED ORDER — SUMATRIPTAN SUCCINATE 50 MG PO TABS: 100.0000 mg | ORAL_TABLET | ORAL | Status: DC | PRN

## 2016-03-09 MED ORDER — CEFAZOLIN SODIUM-DEXTROSE 2-4 GM/100ML-% IV SOLN
INTRAVENOUS | Status: AC
  Filled 2016-03-09: qty 100

## 2016-03-09 MED ORDER — ENOXAPARIN SODIUM 30 MG/0.3ML ~~LOC~~ SOLN
30.0000 mg | Freq: Two times a day (BID) | SUBCUTANEOUS | Status: DC
  Administered 2016-03-10 – 2016-03-12 (×5): 30 mg via SUBCUTANEOUS
  Filled 2016-03-09 (×5): qty 0.3

## 2016-03-09 MED ORDER — ONDANSETRON HCL 4 MG/2ML IJ SOLN: 4.0000 mg | Freq: Once | INTRAMUSCULAR | Status: DC | PRN

## 2016-03-09 MED ORDER — IBANDRONATE SODIUM 150 MG PO TABS: 150.0000 mg | ORAL_TABLET | ORAL | Status: DC

## 2016-03-09 SURGICAL SUPPLY — 56 items
BANDAGE ACE 6X5 VEL STRL LF (GAUZE/BANDAGES/DRESSINGS) ×2 IMPLANT
BLADE SAW 1 (BLADE) ×2 IMPLANT
BLOCK CUTTING TIBIAL 2 LT (MISCELLANEOUS) IMPLANT
CANISTER SUCT 1200ML W/VALVE (MISCELLANEOUS) ×2 IMPLANT
CANISTER SUCT 3000ML (MISCELLANEOUS) ×4 IMPLANT
CAPT KNEE TOTAL 3 ×2 IMPLANT
CATH FOL LEG HOLDER (MISCELLANEOUS) ×2 IMPLANT
CATH TRAY METER 16FR LF (MISCELLANEOUS) ×2 IMPLANT
CEMENT HV SMART SET (Cement) ×4 IMPLANT
CHLORAPREP W/TINT 26ML (MISCELLANEOUS) ×4 IMPLANT
COOLER POLAR GLACIER W/PUMP (MISCELLANEOUS) ×2 IMPLANT
DRAPE INCISE IOBAN 66X45 STRL (DRAPES) ×4 IMPLANT
DRAPE SHEET LG 3/4 BI-LAMINATE (DRAPES) ×4 IMPLANT
ELECT CAUTERY BLADE 6.4 (BLADE) ×2 IMPLANT
ELECT REM PT RETURN 9FT ADLT (ELECTROSURGICAL) ×2
ELECTRODE REM PT RTRN 9FT ADLT (ELECTROSURGICAL) ×1 IMPLANT
FEMUR BONE MODEL 4.9010 MEDACT (Bone Implant) ×2 IMPLANT
GAUZE PETRO XEROFOAM 1X8 (MISCELLANEOUS) ×2 IMPLANT
GAUZE SPONGE 4X4 12PLY STRL (GAUZE/BANDAGES/DRESSINGS) ×2 IMPLANT
GLOVE BIOGEL PI IND STRL 9 (GLOVE) ×1 IMPLANT
GLOVE BIOGEL PI INDICATOR 9 (GLOVE) ×1
GLOVE INDICATOR 8.0 STRL GRN (GLOVE) ×2 IMPLANT
GLOVE SURG ORTHO 8.0 STRL STRW (GLOVE) ×2 IMPLANT
GLOVE SURG ORTHO 9.0 STRL STRW (GLOVE) ×2 IMPLANT
GOWN STRL REUS W/ TWL LRG LVL3 (GOWN DISPOSABLE) ×1 IMPLANT
GOWN STRL REUS W/ TWL XL LVL3 (GOWN DISPOSABLE) ×1 IMPLANT
GOWN STRL REUS W/TWL LRG LVL3 (GOWN DISPOSABLE) ×1
GOWN STRL REUS W/TWL XL LVL3 (GOWN DISPOSABLE) ×1
GOWN SURG XXL (GOWNS) ×2 IMPLANT
HANDPIECE SUCTION TUBG SURGILV (MISCELLANEOUS) ×2 IMPLANT
HOOD PEEL AWAY FLYTE STAYCOOL (MISCELLANEOUS) ×4 IMPLANT
IMMBOLIZER KNEE 19 BLUE UNIV (SOFTGOODS) ×2 IMPLANT
KNEE MEDACTA TIBIAL/FEMORAL BL (Knees) ×2 IMPLANT
KNIFE SCULPS 14X20 (INSTRUMENTS) ×2 IMPLANT
NDL SAFETY 18GX1.5 (NEEDLE) ×2 IMPLANT
NEEDLE SPNL 18GX3.5 QUINCKE PK (NEEDLE) ×2 IMPLANT
NEEDLE SPNL 20GX3.5 QUINCKE YW (NEEDLE) ×2 IMPLANT
NS IRRIG 1000ML POUR BTL (IV SOLUTION) ×2 IMPLANT
PACK TOTAL KNEE (MISCELLANEOUS) ×2 IMPLANT
PAD WRAPON POLAR KNEE (MISCELLANEOUS) ×1 IMPLANT
SOL .9 NS 3000ML IRR  AL (IV SOLUTION) ×1
SOL .9 NS 3000ML IRR UROMATIC (IV SOLUTION) ×1 IMPLANT
STAPLER SKIN PROX 35W (STAPLE) ×2 IMPLANT
STRAP SAFETY BODY (MISCELLANEOUS) ×2 IMPLANT
SUCTION FRAZIER HANDLE 10FR (MISCELLANEOUS) ×1
SUCTION TUBE FRAZIER 10FR DISP (MISCELLANEOUS) ×1 IMPLANT
SUT DVC 2 QUILL PDO  T11 36X36 (SUTURE) ×1
SUT DVC 2 QUILL PDO T11 36X36 (SUTURE) ×1 IMPLANT
SUT DVC QUILL MONODERM 30X30 (SUTURE) ×2 IMPLANT
SUT TICRON 2-0 30IN 311381 (SUTURE) ×2 IMPLANT
SYR 20CC LL (SYRINGE) ×2 IMPLANT
SYR 50ML LL SCALE MARK (SYRINGE) ×4 IMPLANT
TIBIAL BONE MODEL LEFT (MISCELLANEOUS) IMPLANT
TOWEL OR 17X26 4PK STRL BLUE (TOWEL DISPOSABLE) ×2 IMPLANT
TOWER CARTRIDGE SMART MIX (DISPOSABLE) ×2 IMPLANT
WRAPON POLAR PAD KNEE (MISCELLANEOUS) ×2

## 2016-03-09 NOTE — NC FL2 (Signed)
[place] LEVEL OF CARE SCREENING TOOL     IDENTIFICATION  Patient Name: Bonnie Owens Birthdate: 07-06-1951 Sex: female Admission Date (Current Location): 03/09/2016  [place] and [place] Number:  Engineering geologist and Address:  [facility], [address] [place], [place] [address]      Provider Number: [id number]  Attending Physician Name and Address:  Hessie Knows, MD  Relative Name and Phone Number:       Current Level of Care: Hospital Recommended Level of Care: [place] Prior Approval Number:    Date Approved/Denied:   PASRR Number:  ([id number])  Discharge Plan: SNF    Current Diagnoses: Patient Active Problem List   Diagnosis Date Noted  . Primary osteoarthritis of left knee 03/09/2016  . Osteopenia 11/17/2015  . Migraine without aura and without status migrainosus, not intractable 11/17/2015  . Urge urinary incontinence 11/17/2015  . Dyslipidemia 11/17/2015  . Large breasts 11/17/2015  . Menopause 11/17/2015  . Vitamin D deficiency 11/17/2015  . MVP (mitral valve prolapse) 11/17/2015  . History of lumbar fusion 11/17/2015  . Chronic constipation 11/17/2015  . Venous insufficiency 11/17/2015  . GERD (gastroesophageal reflux disease) 11/17/2015  . Eustachian tube dysfunction 11/17/2015  . Sciatica of left side     Orientation RESPIRATION BLADDER Height & Weight     Self, Time, Situation, Place  Normal Continent Weight: 148 lb (67.132 kg) Height:  5\' 2"  (157.5 cm)  BEHAVIORAL SYMPTOMS/MOOD NEUROLOGICAL BOWEL NUTRITION STATUS   (none )  (none ) Continent Diet (Diet: Clear Liquid )  AMBULATORY STATUS COMMUNICATION OF NEEDS Skin   Extensive Assist Verbally Surgical wounds (Incision: Left Knee )                       Personal Care Assistance Level of Assistance  Bathing, Feeding, Dressing Bathing Assistance: Limited assistance Feeding assistance: Independent Dressing Assistance:  Limited assistance     Functional Limitations Info  Sight, Hearing, Speech Sight Info: Adequate Hearing Info: Adequate Speech Info: Adequate    SPECIAL CARE FACTORS FREQUENCY  PT (By licensed PT), OT (By licensed OT)     PT Frequency:  (5) OT Frequency:  (5)            Contractures      Additional Factors Info  Code Status, Allergies Code Status Info:  (Not on File ) Allergies Info:  (No Known Allergies. )           Current Medications (03/09/2016):  This is the current hospital active medication list Current Facility-Administered Medications  Medication Dose Route Frequency Provider Last Rate Last Dose  . 0.9 %  sodium chloride infusion   Intravenous Continuous Hessie Knows, MD 75 mL/hr at 03/09/16 1055    . [START ON 03/10/2016] acetaminophen (TYLENOL) tablet 650 mg  650 mg Oral Q6H PRN Hessie Knows, MD       Or  . Derrill Memo ON 03/10/2016] acetaminophen (TYLENOL) suppository 650 mg  650 mg Rectal Q6H PRN Hessie Knows, MD      . acetaminophen (TYLENOL) tablet 1,000 mg  1,000 mg Oral 4 times per day Hessie Knows, MD      . alum & mag hydroxide-simeth (MAALOX/MYLANTA) 200-200-20 MG/5ML suspension 30 mL  30 mL Oral Q4H PRN Hessie Knows, MD      . aspirin EC tablet 81 mg  81 mg Oral Daily Hessie Knows, MD      . bisacodyl (DULCOLAX) suppository 10 mg  10 mg Rectal Daily PRN Hessie Knows, MD      . ceFAZolin (ANCEF) 2-4 GM/100ML-% IVPB           . ceFAZolin (ANCEF) IVPB 2g/100 mL premix  2 g Intravenous Q6H Hessie Knows, MD      . cholecalciferol (VITAMIN D) tablet 5,000 Units  5,000 Units Oral Daily Hessie Knows, MD      . Derrill Memo ON 03/10/2016] diltiazem (CARDIZEM CD) 24 hr capsule 120 mg  120 mg Oral Daily Hessie Knows, MD      . diphenhydrAMINE (BENADRYL) 12.5 MG/5ML elixir 12.5-25 mg  12.5-25 mg Oral Q4H PRN Hessie Knows, MD      . docusate sodium (COLACE) capsule 100 mg  100 mg Oral BID Hessie Knows, MD      . Derrill Memo ON 03/10/2016] enoxaparin (LOVENOX) injection 30 mg  30 mg  Subcutaneous Q12H Hessie Knows, MD      . fluticasone [facility]) 50 MCG/ACT nasal spray 2 spray  2 spray Each Nare Daily PRN Hessie Knows, MD      . magnesium citrate solution 1 Bottle  1 Bottle Oral Once PRN Hessie Knows, MD      . magnesium hydroxide (MILK OF MAGNESIA) suspension 30 mL  30 mL Oral Daily PRN Hessie Knows, MD      . menthol-cetylpyridinium (CEPACOL) lozenge 3 mg  1 lozenge Oral PRN Hessie Knows, MD       Or  . phenol (CHLORASEPTIC) mouth spray 1 spray  1 spray Mouth/Throat PRN Hessie Knows, MD      . methocarbamol (ROBAXIN) tablet 500 mg  500 mg Oral Q6H PRN Hessie Knows, MD       Or  . methocarbamol (ROBAXIN) 500 mg in dextrose 5 % 50 mL IVPB  500 mg Intravenous Q6H PRN Hessie Knows, MD      . metoCLOPramide (REGLAN) tablet 5-10 mg  5-10 mg Oral Q8H PRN Hessie Knows, MD       Or  . metoCLOPramide (REGLAN) injection 5-10 mg  5-10 mg Intravenous Q8H PRN Hessie Knows, MD      . morphine 2 MG/ML injection 2 mg  2 mg Intravenous Q1H PRN Hessie Knows, MD      . ondansetron [facility]) tablet 4 mg  4 mg Oral Q6H PRN Hessie Knows, MD       Or  . ondansetron [facility]) injection 4 mg  4 mg Intravenous Q6H PRN Hessie Knows, MD      . oxybutynin (DITROPAN-XL) 24 hr tablet 10 mg  10 mg Oral Daily Hessie Knows, MD      . oxyCODONE (Oxy IR/ROXICODONE) immediate release tablet 5-10 mg  5-10 mg Oral Q3H PRN Hessie Knows, MD   5 mg at 03/09/16 1200  . pantoprazole (PROTONIX) EC tablet 40 mg  40 mg Oral Daily Hessie Knows, MD      . polyethylene glycol [facility] / GLYCOLAX) packet 17 g  17 g Oral Daily Hessie Knows, MD      . sodium chloride flush 0.9 % injection           . SUMAtriptan (IMITREX) tablet 100 mg  100 mg Oral PRN Hessie Knows, MD      . vitamin C (ASCORBIC ACID) tablet 1,000 mg  1,000 mg Oral Daily Hessie Knows, MD      . zolpidem [facility]) tablet 5 mg  5 mg Oral QHS PRN Hessie Knows, MD         Discharge Medications: Please see discharge summary for  a list of discharge  medications.  Relevant Imaging Results:  Relevant Lab Results:   Additional Information  (SSN: [id number])  Loralyn Freshwater, LCSW

## 2016-03-09 NOTE — Anesthesia Procedure Notes (Signed)
Spinal Patient location during procedure: OR Start time: 03/09/2016 7:20 AM End time: 03/09/2016 7:25 AM Staffing Resident/CRNA: Nelda Marseille Performed by: resident/CRNA  Preanesthetic Checklist Completed: patient identified, site marked, surgical consent, pre-op evaluation, timeout performed, IV checked, risks and benefits discussed and monitors and equipment checked Spinal Block Patient position: sitting Prep: Betadine Patient monitoring: heart rate, continuous pulse ox, blood pressure and cardiac monitor Approach: midline Location: L4-5 Injection technique: single-shot Needle Needle type: Whitacre and Introducer  Needle gauge: 24 G Needle length: 9 cm Additional Notes Negative paresthesia. Negative blood return. Positive free-flowing CSF. Expiration date of kit checked and confirmed. Patient tolerated procedure well, without complications.

## 2016-03-09 NOTE — Progress Notes (Signed)
PHARMACIST - PHYSICIAN ORDER COMMUNICATION  CONCERNING: P&T Medication Policy on Herbal Medications  DESCRIPTION:  This patient's order for:  Crill Oil  has been noted.  This product(s) is classified as an "herbal" or natural product. Due to a lack of definitive safety studies or FDA approval, nonstandard manufacturing practices, plus the potential risk of unknown drug-drug interactions while on inpatient medications, the Pharmacy and Therapeutics Committee does not permit the use of "herbal" or natural products of this type within [facility].   ACTION TAKEN: The pharmacy department is unable to verify this order at this time and your patient has been informed of this safety policy. Please reevaluate patient's clinical condition at discharge and address if the herbal or natural product(s) should be resumed at that time.

## 2016-03-09 NOTE — Transfer of Care (Signed)
Immediate Anesthesia Transfer of Care Note  Patient: Bonnie Owens  Procedure(s) Performed: Procedure(s): TOTAL KNEE ARTHROPLASTY (Left)  Patient Location: PACU  Anesthesia Type:Spinal  Level of Consciousness: sedated  Airway & Oxygen Therapy: Patient Spontanous Breathing and Patient connected to face mask oxygen  Post-op Assessment: Report given to RN and Post -op Vital signs reviewed and stable  Post vital signs: Reviewed and stable  Last Vitals:  Filed Vitals:   03/09/16 0649 03/09/16 0920  BP: 127/84 97/57  Pulse: 74   Temp: 36.6 C 36.2 C  Resp: 16 14    Complications: No apparent anesthesia complications

## 2016-03-09 NOTE — Progress Notes (Signed)
PT Cancellation Note  Patient Details Name: [name] MRN: [id number] DOB: 12-04-1951   Cancelled Treatment:    Reason Eval/Treat Not Completed: Medical issues which prohibited therapy.  Per nursing, pt has numbness in LE's and is nauseous.  Nursing reported that pt is not appropriate to see at this time.  Will Re-attempt PT eval at a later date and time when medically appropriate.  Mittie Bodo, SPT  Mittie Bodo 03/09/2016, 3:51 PM

## 2016-03-09 NOTE — Transfer of Care (Signed)
Immediate Anesthesia Transfer of Care Note  Patient: Bonnie Owens  Procedure(s) Performed: Procedure(s): TOTAL KNEE ARTHROPLASTY (Left)  Patient Location: PACU  Anesthesia Type:General  Level of Consciousness: sedated  Airway & Oxygen Therapy: Patient Spontanous Breathing and Patient connected to face mask oxygen  Post-op Assessment: Report given to RN and Post -op Vital signs reviewed and stable  Post vital signs: Reviewed and stable  Last Vitals:  Filed Vitals:   03/09/16 1020 03/09/16 1035  BP: 93/63 99/68  Pulse: 63 68  Temp: 36.1 C 36 C  Resp: 12 15    Complications: No apparent anesthesia complications

## 2016-03-09 NOTE — H&P (Signed)
Reviewed paper H+P, will be scanned into chart. No changes noted.  

## 2016-03-09 NOTE — Care Management Note (Addendum)
Case Management Note  Patient Details  Name: Bonnie Owens MRN: [id number] Date of Birth: [birth year]  Subjective/Objective:                   Met with patient and her husband to discuss discharge planning. She would like to use [facility] if they are in-network with her health insurance. She has a rolling walker and does not think she will need a bedside commode. She states she will be sleeping in a recliner since her bed is upstairs. She denies need for a hospital bed. She uses [facility] for [facility]  [phone]. Action/Plan: Referral to [place]. They are seeking insurance authorization. Lovenox 57m #14 [place] to [place] RNCM will continue to follow. PT pending.   Expected Discharge Date:  03/12/16               Expected Discharge Plan:     In-House Referral:     Discharge planning Services  CM Consult  Post Acute Care Choice:  Home Health Choice offered to:  Patient  DME Arranged:    DME Agency:     HH Arranged:  PT HH Agency:     Status of Service:  In process, will continue to follow  Medicare Important Message Given:    Date Medicare IM Given:    Medicare IM give by:    Date Additional Medicare IM Given:    Additional Medicare Important Message give by:     If discussed at LNormalof Stay Meetings, dates discussed:    Additional Comments: [facility] accepted patient for home health PT. Lovenox $60- patient notified.  AMarshell Garfinkel RN 03/09/2016, 3:20 PM

## 2016-03-09 NOTE — Progress Notes (Signed)

## 2016-03-09 NOTE — Anesthesia Preprocedure Evaluation (Signed)
Anesthesia Evaluation  Patient identified by MRN, date of birth, ID band Patient awake  General Assessment Comment:Bad headache when waking up from anesthesia  History of Anesthesia Complications (+) history of anesthetic complications  Airway Mallampati: III  TM Distance: <3 FB Neck ROM: Full    Dental  (+) Chipped   Pulmonary neg pulmonary ROS,    Pulmonary exam normal breath sounds clear to auscultation       Cardiovascular hypertension, Pt. on medications + Peripheral Vascular Disease  Normal cardiovascular exam  Venous insufficiency   Neuro/Psych  Headaches, L sided sciatic nerve pain in past negative psych ROS   GI/Hepatic Neg liver ROS, GERD  Medicated and Controlled,  Endo/Other  negative endocrine ROS  Renal/GU negative Renal ROS  negative genitourinary   Musculoskeletal  (+) Arthritis , Osteoarthritis,    Abdominal Normal abdominal exam  (+)   Peds negative pediatric ROS (+)  Hematology negative hematology ROS (+)   Anesthesia Other Findings   Reproductive/Obstetrics                             Anesthesia Physical Anesthesia Plan  ASA: III  Anesthesia Plan: Spinal   Post-op Pain Management:    Induction: Intravenous  Airway Management Planned:   Additional Equipment:   Intra-op Plan:   Post-operative Plan:   Informed Consent: I have reviewed the patients History and Physical, chart, labs and discussed the procedure including the risks, benefits and alternatives for the proposed anesthesia with the patient or authorized representative who has indicated his/her understanding and acceptance.   Dental advisory given  Plan Discussed with: CRNA and Surgeon  Anesthesia Plan Comments:         Anesthesia Quick Evaluation

## 2016-03-09 NOTE — Op Note (Signed)
03/09/2016  9:18 AM  PATIENT:  Bonnie Owens  65 y.o. female  PRE-OPERATIVE DIAGNOSIS: primary left knee osteoarthritis  POST-OPERATIVE DIAGNOSIS:  Primary left knee osteoarthritis  PROCEDURE:  Procedure(s): TOTAL KNEE ARTHROPLASTY (Left)  SURGEON: Laurene Footman, MD  ASSISTANTS: Rachelle Hora [facility]  ANESTHESIA:   spinal  EBL:  Total I/O In: 1000 [I.V.:1000] Out: 250 [Urine:200; Blood:50]  BLOOD ADMINISTERED:none  DRAINS: none   LOCAL MEDICATIONS USED:  MARCAINE    and OTHER Toradol morphine and Exparel  SPECIMEN:  Source of Specimen:  Cut ends of bone  DISPOSITION OF SPECIMEN:  PATHOLOGY  COUNTS:  YES  TOURNIQUET:   Total Tourniquet Time Documented: Thigh (Left) - 14 minutes Total: Thigh (Left) - 14 minutes  73 minute tourniquet time at 300 mmHg  IMPLANTS: Medacta 3+ GMK sphere left femoral component, 2 tibia component was short stem and 20 mm flex insert, size 2 patella all components cemented  DICTATION: .Dragon Dictation patient was brought to the operating room and after adequate anesthesia was obtained the left leg was prepped and draped in sterile fashion was turned by the upper thigh. After patient identification and timeout procedures were completed tourniquet was raised. Midline skin incision was made followed by medial parapatellar arthrotomy revealing extensive degenerative change throughout the knee particularly the medial compartment where there was exposed bone over the entire femoral condyle and tibial condyle also having exposed bone, there is mild synovitis present as well. Anterior cruciate ligament fat pad excised and proximal tibia exposed to apply the tibial cutting guide. Proximal tibia cut was carried out followed by resection of the distal femur using the femoral cutting guide from the my knee system. The femur was then cut using the 3+ cutting guide anterior posterior and chamfer cuts carried out. With excision of the anterior cruciate ligament and  PCL the flexion extension gaps were equal and quite a bit larger than initial. The proximal tibia cut had been finished and meniscus then removed posteriorly size 2 tibia fit well 3 it would overhang so the 2 guide was used and possible tibial preparation carried out with drilling for a short stem with keel punch with trials in place a 17 and 28 mm inserts were seemed about right for final components in final components chosen after final implant cement placed. The distal femoral drill holes were made after placement of the femoral trial followed by the notch cut for the trochlear groove. With these components removed patella was cut using the patellar cutting guide and a size 2 was chosen. After 3 drill holes were made the knee was thoroughly irrigated at this point and the local medications noted above were infiltrated into the periarticular tissues. With the bony surfaces dried all components were cemented into place with Ace 17 mm trial insert followed by the 20 insert which gave better stability. The knee was again irrigated after the cemented set and the final component placed with a 20 mm insert and set screw tightened with the torque screwdriver. There is full range of motion with good stability flexion and extension. The arthrotomy was repaired using Tycron along the medial capsule followed by a heavy Quill for watertight closure followed by 20 Quill skin staples Xeroform 4 x 4's web roll and Ace wrap applied along Polar Care immobilizer  PLAN OF CARE: Admit to inpatient   PATIENT DISPOSITION:  PACU - hemodynamically stable.

## 2016-03-10 ENCOUNTER — Encounter: Payer: Self-pay | Admitting: Orthopedic Surgery

## 2016-03-10 LAB — BASIC METABOLIC PANEL
ANION GAP: 0 — AB (ref 5–15)
BUN: 11 mg/dL (ref 6–20)
CALCIUM: 7.6 mg/dL — AB (ref 8.9–10.3)
CHLORIDE: 104 mmol/L (ref 101–111)
CO2: 26 mmol/L (ref 22–32)
Creatinine, Ser: 0.45 mg/dL (ref 0.44–1.00)
GFR calc non Af Amer: >60 mL/min (ref >60)
Glucose, Bld: 111 mg/dL — ABNORMAL HIGH (ref 65–99)
POTASSIUM: 3.4 mmol/L — AB (ref 3.5–5.1)
Sodium: 130 mmol/L — ABNORMAL LOW (ref 135–145)

## 2016-03-10 LAB — CBC
HEMATOCRIT: 31.8 % — AB (ref 35.0–47.0)
HEMOGLOBIN: 10.8 g/dL — AB (ref 12.0–16.0)
MCH: 31.6 pg (ref 26.0–34.0)
MCHC: 34.1 g/dL (ref 32.0–36.0)
MCV: 92.8 fL (ref 80.0–100.0)
Platelets: 235 10*3/uL (ref 150–440)
RBC: 3.43 MIL/uL — AB (ref 3.80–5.20)
RDW: 12.6 % (ref 11.5–14.5)
WBC: 7.1 10*3/uL (ref 3.6–11.0)

## 2016-03-10 NOTE — Evaluation (Signed)
Physical Therapy Evaluation Patient Details Name: Bonnie Owens MRN: [id number] DOB: May 15, 1951 Today's Date: 03/10/2016   History of Present Illness  Pt here post L TKA.  Clinical Impression  Pt did well with PT exam and was able to do some AROM exercises, ambulate to the door and back and had nearly 75 degrees of flexion.  She was not yet able to do SLR but showed ability to engage quad and was motivated t/o 10 minutes of exercises apart from the PT exam.  Pt eager to work with PT despite pain with activity.      Follow Up Recommendations Home health PT    Equipment Recommendations       Recommendations for Other Services       Precautions / Restrictions Precautions Precautions: Fall Restrictions RLE Weight Bearing: Weight bearing as tolerated      Mobility  Bed Mobility Overal bed mobility: Needs Assistance Bed Mobility: Supine to Sit     Supine to sit: Min assist (to get L LE to EOB)     General bed mobility comments: Pt shows good effort, needs some assist  Transfers Overall transfer level: Modified independent Equipment used: Rolling walker (2 wheeled)             General transfer comment: Pt needing only cuing and CGA, she is able to physically rise w/o assist  Ambulation/Gait Ambulation/Gait assistance: Min assist;Min guard Ambulation Distance (Feet): 30 Feet Assistive device: Rolling walker (2 wheeled)       General Gait Details: Pt has some expected pain hesitancy and does not take full WBing on L most of the time, but ultimately she does well and shows good safety and motivation  Stairs            Wheelchair Mobility    Modified Rankin (Stroke Patients Only)       Balance                                             Pertinent Vitals/Pain Pain Assessment: 0-10 Pain Score: 8  (pt reports 2-3/10 pain at rest)    Home Living Family/patient expects to be discharged to:: Private residence Living Arrangements:  Spouse/significant other Available Help at Discharge: Family (daughter will be staying with them initially to assist) Type of Home: House Home Access: Stairs to enter Entrance Stairs-Rails: None Entrance Stairs-Number of Steps: 3 Home Layout: Two level;Able to live on main level with bedroom/bathroom        Prior Function Level of Independence: Independent         Comments: Pt is typically able to drive, run errands, generally be active     Hand Dominance        Extremity/Trunk Assessment   Upper Extremity Assessment: Overall WFL for tasks assessed           Lower Extremity Assessment: LLE deficits/detail   LLE Deficits / Details: Pt shows ability to engage quad and has functional hip and ankle strength.  No SLRs yet but she shows grossly 3/5 knee flexion/ext strength     Communication   Communication: No difficulties  Cognition Arousal/Alertness: Awake/alert Behavior During Therapy: WFL for tasks assessed/performed Overall Cognitive Status: Within Functional Limits for tasks assessed                      General Comments  Exercises Total Joint Exercises Ankle Circles/Pumps: 10 reps Quad Sets: 10 reps;Strengthening Gluteal Sets: Strengthening;10 reps Short Arc Quad: AAROM;10 reps Heel Slides: AROM;Strengthening;10 reps Hip ABduction/ADduction: AROM;10 reps Knee Flexion: PROM;5 reps Goniometric ROM: 2-73      Assessment/Plan    PT Assessment Patient needs continued PT services  PT Diagnosis Difficulty walking;Generalized weakness   PT Problem List Decreased strength;Decreased range of motion;Decreased activity tolerance;Decreased balance;Decreased mobility;Decreased safety awareness  PT Treatment Interventions Gait training;DME instruction;Stair training;Functional mobility training;Therapeutic activities;Therapeutic exercise;Balance training;Neuromuscular re-education;Cognitive remediation   PT Goals (Current goals can be found in the  Care Plan section) Acute Rehab PT Goals Patient Stated Goal: Go home PT Goal Formulation: With patient Time For Goal Achievement: 03/24/16 Potential to Achieve Goals: Good    Frequency BID   Barriers to discharge        Co-evaluation               End of Session Equipment Utilized During Treatment: Gait belt Activity Tolerance: Patient limited by fatigue;Patient limited by pain Patient left: with chair alarm set;with call bell/phone within reach           Time: 0925-0958 PT Time Calculation (min) (ACUTE ONLY): 33 min   Charges:   PT Evaluation $PT Eval Low Complexity: 1 Procedure PT Treatments $Therapeutic Exercise: 8-22 mins   PT G Codes:       Bonnie Owens, PT, DPT [phone]  Bonnie Owens 03/10/2016, 11:42 AM

## 2016-03-10 NOTE — Progress Notes (Signed)
Physical Therapy Treatment Patient Details Name: Bonnie Owens MRN: [id number] DOB: 23-Nov-1951 Today's Date: 03/10/2016    History of Present Illness Pt. is a 65 y.o. female who was admitted for a Left TKR    PT Comments    Pt shows good effort with all PT activities despite having continued pain with movement and generally feeling weak/tired.  She shows better ambulation tolerance and distance, but still is very reliant on the walker.  Pt able to get from supine to sitting EOB with very little assistance this afternoon, does need assist to get LEs back into bed.   Follow Up Recommendations  Home health PT     Equipment Recommendations       Recommendations for Other Services       Precautions / Restrictions Precautions Precautions: Fall Restrictions Weight Bearing Restrictions: Yes RLE Weight Bearing: Weight bearing as tolerated    Mobility  Bed Mobility Overal bed mobility: Needs Assistance Bed Mobility: Supine to Sit;Sit to Supine     Supine to sit: Min assist;Min guard Sit to supine: Mod assist;Min assist (to lift L LE into bed)      Transfers Overall transfer level: Modified independent Equipment used: Rolling walker (2 wheeled)             General transfer comment: Pt needing only cuing and CGA, she is able to physically rise w/o assist  Ambulation/Gait Ambulation/Gait assistance: Min guard Ambulation Distance (Feet): 45 Feet Assistive device: Rolling walker (2 wheeled)       General Gait Details: Pt appears to be more tolerant of WBing and generally using L LE more during ambulation.  She still has some hesitancy to looks (and reports feeling) better with ambulation.  Increased speed and cadence this afternoon.    Stairs            Wheelchair Mobility    Modified Rankin (Stroke Patients Only)       Balance                                    Cognition Arousal/Alertness: Awake/alert Behavior During Therapy: WFL  for tasks assessed/performed Overall Cognitive Status: Within Functional Limits for tasks assessed                      Exercises Total Joint Exercises Ankle Circles/Pumps: 10 reps Quad Sets: 10 reps;Strengthening Gluteal Sets: Strengthening;10 reps Short Arc Quad: AAROM;10 reps Heel Slides: AROM;Strengthening;5 reps Knee Flexion: PROM;5 reps SLR: AAROM/PROM; 5 reps   General Comments        Pertinent Vitals/Pain Pain Assessment: 0-10 Pain Score: 7  (again minimal pain at rest 2-3/10) Pain Location: Pt. reports just receiving pain medicine after PT. Pt. pain  at rest is 2/10, and is increased with movement.    Home Living Family/patient expects to be discharged to:: Private residence Living Arrangements: Spouse/significant other Available Help at Discharge: Family Type of Home: House Home Access: Stairs to enter Entrance Stairs-Rails: None Home Layout: Two level;Able to live on main level with bedroom/bathroom Home Equipment: Gilford Rile - 2 wheels      Prior Function Level of Independence: Independent      Comments: Independent, driving, avid gardener   PT Goals (current goals can now be found in the care plan section) Acute Rehab PT Goals Patient Stated Goal: To return home, regain independence, and return to gardening. Progress towards PT goals: Progressing toward  goals    Frequency  BID    PT Plan Current plan remains appropriate    Co-evaluation             End of Session Equipment Utilized During Treatment: Gait belt Activity Tolerance: Patient limited by fatigue;Patient limited by pain Patient left: with bed alarm set;with call bell/phone within reach;with family/visitor present     Time: [id number] PT Time Calculation (min) (ACUTE ONLY): 29 min  Charges:  $Gait Training: 8-22 mins $Therapeutic Exercise: 8-22 mins                    G Codes:     [name], PT, DPT [phone]  [name] 03/10/2016, 3:27 PM

## 2016-03-10 NOTE — Evaluation (Signed)
Occupational Therapy Evaluation Patient Details Name: Bonnie Owens MRN: [id number] DOB: Nov 07, 1951 Today's Date: 03/10/2016    History of Present Illness Pt. is a 65 y.o. female who was admitted for a Left TKR   Clinical Impression   Pt is 65 year old Female s/p Left TKR.  Pt was independent in all ADLs prior to surgery and is eager to return to her PLOF.  Pt currently requires max assist for Left LE ADLs while in seated position due to pain and limited AROM of the Left knee.  Pt would benefit from continued instruction in dressing techniques with or without assistive devices for dressing. Pt. Also continues to benefit from recommendations for home modifications to increase safety in the bathroom and prevent falls. Will assess for OT Home health needs as pt progresses in therapy.      Follow Up Recommendations  Home health OT    Equipment Recommendations       Recommendations for Other Services PT consult     Precautions / Restrictions Precautions Precautions: Fall Restrictions Weight Bearing Restrictions: Yes RLE Weight Bearing: Weight bearing as tolerated      Mobility Bed Mobility Transfers   Balance                                            ADL Overall ADL's : Needs assistance/impaired                                       General ADL Comments: Pt. is indepndent with self-feeding, and self-grooming tasks. Pt. requires minA with A/E use for RLE, and MaxA LLE secondary to pain.       Vision     Perception     Praxis      Pertinent Vitals/Pain Pain Assessment: 0-10 Pain Score: 2  Pain Location: Pt. reports just receiving pain medicine after PT. Pt. pain  at rest is 2/10, and is increased with movement.     Hand Dominance     Extremity/Trunk Assessment Upper Extremity Assessment Upper Extremity Assessment: Overall WFL for tasks assessed   Lower Extremity Assessment Lower Extremity Assessment: LLE  deficits/detail LLE Deficits / Details: Pt shows ability to engage quad and has functional hip and ankle strength.  No SLRs yet but she shows grossly 3/5 knee flexion/ext strength       Communication Communication Communication: No difficulties   Cognition Arousal/Alertness: Awake/alert Behavior During Therapy: WFL for tasks assessed/performed Overall Cognitive Status: Within Functional Limits for tasks assessed                     General Comments       Exercises Exercises: Total Joint     Shoulder Instructions      Home Living Family/patient expects to be discharged to:: Private residence Living Arrangements: Spouse/significant other Available Help at Discharge: Family Type of Home: House Home Access: Stairs to enter [employer] of Steps: 3 Entrance Stairs-Rails: None Home Layout: Two level;Able to live on main level with bedroom/bathroom     Bathroom Shower/Tub: Walk-in shower;Door     Bathroom Accessibility: Yes How Accessible: Accessible via walker Home Equipment: Walker - 2 wheels          Prior Functioning/Environment Level of Independence: Independent  Comments: Independent, driving, avid gardener    OT Diagnosis: Generalized weakness   OT Problem List: Decreased strength;Decreased activity tolerance;Decreased knowledge of use of DME or AE;Decreased safety awareness;Impaired UE functional use   OT Treatment/Interventions: Self-care/ADL training;Therapeutic exercise;DME and/or AE instruction;Therapeutic activities    OT Goals(Current goals can be found in the care plan section) Acute Rehab OT Goals Patient Stated Goal: To return home, regain independence, and return to gardening. OT Goal Formulation: With patient Time For Goal Achievement: 03/24/16 Potential to Achieve Goals: Good  OT Frequency: Min 1X/week   Barriers to D/C:            Co-evaluation              End of Session    Activity Tolerance: Patient  tolerated treatment well Patient left: in chair;with family/visitor present   Time: 1130-1145 OT Time Calculation (min): 15 min Charges:  OT General Charges $OT Visit: 1 Procedure OT Evaluation $OT Eval Low Complexity: 1 Procedure G-Codes:    Harrel Carina, MS, OTR/L Harrel Carina 03/10/2016, 12:02 PM

## 2016-03-10 NOTE — Anesthesia Postprocedure Evaluation (Signed)
Anesthesia Post Note  Patient: Bonnie Owens  Procedure(s) Performed: Procedure(s) (LRB): TOTAL KNEE ARTHROPLASTY (Left)  Patient location during evaluation: Nursing Unit Anesthesia Type: Spinal Level of consciousness: awake and alert Pain management: satisfactory to patient Vital Signs Assessment: post-procedure vital signs reviewed and stable Respiratory status: spontaneous breathing Cardiovascular status: blood pressure returned to baseline Postop Assessment: no headache and spinal receding Anesthetic complications: no    Last Vitals:  Filed Vitals:   03/10/16 0408 03/10/16 0741  BP: 114/54 120/66  Pulse: 79 80  Temp: 37 C 36.8 C  Resp:  18    Last Pain:  Filed Vitals:   03/10/16 0741  PainSc: 8                  Rolla Plate P

## 2016-03-10 NOTE — Progress Notes (Signed)
   Subjective: 1 Day Post-Op Procedure(s) (LRB): TOTAL KNEE ARTHROPLASTY (Left) Patient reports pain as 7 on 0-10 scale.   Patient is well, and has had no acute complaints or problems Denies any CP, SOB, ABD pain. We will start therapy today.  Plan is to go Home after hospital stay.  Objective: Vital signs in last 24 hours: Temp:  [93.7 F (34.3 C)-98.6 F (37 C)] 98.2 F (36.8 C) (04/07 0741) Pulse Rate:  [63-95] 80 (04/07 0741) Resp:  [10-20] 18 (04/07 0741) BP: (93-120)/(54-74) 120/66 mmHg (04/07 0741) SpO2:  [95 %-100 %] 98 % (04/07 0741)  Intake/Output from previous day: 04/06 0701 - 04/07 0700 In: 4113.8 [P.O.:480; I.V.:3433.8; IV Piggyback:200] Out: 1150 [Urine:1100; Blood:50] Intake/Output this shift:     Recent Labs  03/09/16 1312 03/10/16 0528  HGB 12.4 10.8*    Recent Labs  03/09/16 1312 03/10/16 0528  WBC 8.3 7.1  RBC 3.92 3.43*  HCT 36.0 31.8*  PLT 266 235    Recent Labs  03/09/16 1312 03/10/16 0528  NA  --  130*  K  --  3.4*  CL  --  104  CO2  --  26  BUN  --  11  CREATININE 0.43* 0.45  GLUCOSE  --  111*  CALCIUM  --  7.6*   No results for input(s): LABPT, INR in the last 72 hours.  EXAM General - Patient is Alert, Appropriate and Oriented Extremity - Neurologically intact Neurovascular intact Sensation intact distally Intact pulses distally Dorsiflexion/Plantar flexion intact Dressing - dressing C/D/I and no drainage Motor Function - intact, moving foot and toes well on exam.   Past Medical History  Diagnosis Date  . Vitamin D deficiency   . Venous (peripheral) insufficiency   . Urine incontinence   . Osteopenia   . Left knee pain   . Mitral valve prolapse   . Hyperlipidemia   . Chronic constipation   . GERD (gastroesophageal reflux disease)   . Headache     occasional migraine  . Complication of anesthesia     Bad headache when waking    Assessment/Plan:   1 Day Post-Op Procedure(s) (LRB): TOTAL KNEE  ARTHROPLASTY (Left) Active Problems:   Primary osteoarthritis of left knee   hyponatremia  Estimated body mass index is 27.06 kg/(m^2) as calculated from the following:   Height as of this encounter: 5\' 2"  (1.575 m).   Weight as of this encounter: 67.132 kg (148 lb). Advance diet Up with therapy  Needs BM Recheck labs in the am Dc iv fluids today  DVT Prophylaxis - Lovenox, Foot Pumps and TED hose Weight-Bearing as tolerated to left leg D/C O2 and Pulse OX and try on Room Air  T. Rachelle Hora, PA-C [place] 03/10/2016, 8:12 AM

## 2016-03-10 NOTE — Clinical Social Work Note (Signed)
Clinical Social Worker consulted for [employer]. PT is recommending HHPT. CSW will updated RNCM. CSW will sign off as no further needs identified.   Darden Dates, MSW, [place] Social Worker  [phone]

## 2016-03-11 LAB — CBC
HCT: 32.6 % — ABNORMAL LOW (ref 35.0–47.0)
HEMOGLOBIN: 11.2 g/dL — AB (ref 12.0–16.0)
MCH: 31.5 pg (ref 26.0–34.0)
MCHC: 34.3 g/dL (ref 32.0–36.0)
MCV: 91.8 fL (ref 80.0–100.0)
PLATELETS: 258 10*3/uL (ref 150–440)
RBC: 3.55 MIL/uL — ABNORMAL LOW (ref 3.80–5.20)
RDW: 12.7 % (ref 11.5–14.5)
WBC: 7.4 10*3/uL (ref 3.6–11.0)

## 2016-03-11 LAB — BASIC METABOLIC PANEL
ANION GAP: 6 (ref 5–15)
BUN: 6 mg/dL (ref 6–20)
CALCIUM: 8.4 mg/dL — AB (ref 8.9–10.3)
CO2: 31 mmol/L (ref 22–32)
CREATININE: 0.43 mg/dL — AB (ref 0.44–1.00)
Chloride: 97 mmol/L — ABNORMAL LOW (ref 101–111)
GFR calc Af Amer: >60 mL/min (ref >60)
GLUCOSE: 129 mg/dL — AB (ref 65–99)
Potassium: 3.4 mmol/L — ABNORMAL LOW (ref 3.5–5.1)
Sodium: 134 mmol/L — ABNORMAL LOW (ref 135–145)

## 2016-03-11 MED ORDER — ONDANSETRON HCL 4 MG PO TABS: 4.0000 mg | ORAL_TABLET | Freq: Four times a day (QID) | ORAL | Status: DC | PRN

## 2016-03-11 MED ORDER — ENOXAPARIN SODIUM 40 MG/0.4ML ~~LOC~~ SOLN: 40.0000 mg | SUBCUTANEOUS | Status: DC

## 2016-03-11 MED ORDER — LACTULOSE 10 GM/15ML PO SOLN: 20.0000 g | Freq: Two times a day (BID) | ORAL | Status: DC | PRN

## 2016-03-11 MED ORDER — POTASSIUM CHLORIDE 20 MEQ PO PACK
20.0000 meq | PACK | Freq: Two times a day (BID) | ORAL | Status: AC
  Administered 2016-03-11 (×2): 20 meq via ORAL
  Filled 2016-03-11 (×2): qty 1

## 2016-03-11 MED ORDER — TRAMADOL HCL 50 MG PO TABS
50.0000 mg | ORAL_TABLET | ORAL | Status: DC | PRN
  Administered 2016-03-11: 100 mg via ORAL
  Administered 2016-03-11: 50 mg via ORAL
  Administered 2016-03-12: 100 mg via ORAL
  Filled 2016-03-11: qty 2
  Filled 2016-03-11: qty 1
  Filled 2016-03-11: qty 2

## 2016-03-11 MED ORDER — OXYCODONE HCL 5 MG PO TABS: 5.0000 mg | ORAL_TABLET | ORAL | Status: DC | PRN

## 2016-03-11 NOTE — Progress Notes (Signed)
Occupational Therapy Treatment Patient Details Name: Bonnie Owens MRN: [id number] DOB: 05/05/1951 Today's Date: 03/11/2016    History of present illness Pt. is a 65 y.o. female who was admitted for a Left TKR   OT comments  Patient was supine in bed when OT arrived. Stated had sat up in recliner after PT, and just returned to bed. Willing to participate in OT. Patient able to perform supine to sit with Min guard and vc's with extra time. Performed sit/stand with supervision/contact guard, and ambulated to bathroom for toileting with verbal cues for proper walker use in tight spaces. Patient able to perform toilet transfer with supervision. When returned to sitting edge of bed. Performed lower body dressing with AE and min vc's for proper use. Patient able to return from sitting to supine with Min guard and extra time/verbal cues to manage LLE. Ice machine reapplied prior to leaving. Patient would benefit from continued OT services to assist with safety and use of adaptive equipment and techniques for independence with ADL tasks.   Follow Up Recommendations  Home health OT    Equipment Recommendations       Recommendations for Other Services      Precautions / Restrictions Precautions Precautions: Fall Restrictions Weight Bearing Restrictions: Yes RLE Weight Bearing: Weight bearing as tolerated       Mobility Bed Mobility Overal bed mobility: Needs Assistance Bed Mobility: Supine to Sit;Sit to Supine     Supine to sit: Min assist;Min guard     General bed mobility comments: Pt in recliner on arrival, not tested  Transfers Overall transfer level: Needs assistance Equipment used: Rolling walker (2 wheeled)             General transfer comment: Pt again able to rise with only minimal cuing and CGA using RW,.    Balance                                   ADL Overall ADL's : Needs assistance/impaired                     Lower Body Dressing:  Minimal assistance;With adaptive equipment   Toilet Transfer: Min guard;Cueing for safety;RW                    Vision                     Perception     Praxis      Cognition   Behavior During Therapy: [facility] for tasks assessed/performed Overall Cognitive Status: Within Functional Limits for tasks assessed                       Extremity/Trunk Assessment               Exercises Total Joint Exercises Ankle Circles/Pumps: 10 reps Quad Sets: 10 reps;Strengthening Gluteal Sets: Strengthening;10 reps Short Arc Quad: AAROM;10 reps Heel Slides: AROM;Strengthening;5 reps Hip ABduction/ADduction: AROM;10 reps Knee Flexion: PROM;5 reps Goniometric ROM: 1-81   Shoulder Instructions       General Comments      Pertinent Vitals/ Pain       Pain Assessment: 0-10 Pain Score: 4  Pain Location: Patient reports receiving pain medication 1-2 hours prior to OT.  Pain Intervention(s): Limited activity within patient's tolerance;Monitored during session;Ice applied  Home Living  Prior Functioning/Environment              Frequency Min 1X/week     Progress Toward Goals  OT Goals(current goals can now be found in the care plan section)  Progress towards OT goals: Progressing toward goals  Acute Rehab OT Goals Patient Stated Goal: To return home, regain independence, and return to gardening. OT Goal Formulation: With patient Time For Goal Achievement: 03/24/16 Potential to Achieve Goals: Good  Plan Discharge plan remains appropriate    Co-evaluation          OT goals addressed during session: ADL's and self-care;Proper use of Adaptive equipment and DME      End of Session Equipment Utilized During Treatment: Gait belt;Rolling walker   Activity Tolerance Patient tolerated treatment well   Patient Left in bed;with call bell/phone within reach;with bed alarm set;Other (comment)  (ice maching reapplied)   Nurse Communication          Time: [id number] OT Time Calculation (min): 23 min  Charges: OT General Charges $OT Visit: 1 Procedure OT Treatments $Self Care/Home Management : 23-37 mins  Smt. Loder L 03/11/2016, 2:18 PM  Amie [place], OTR/L

## 2016-03-11 NOTE — Discharge Summary (Signed)
[facility] Discharge Summary  Patient ID: Bonnie Owens MRN: [id number] DOB/AGE: [birth year] 65 y.o.  Admit date: 03/09/2016 Discharge date: 03/12/16 Admission Diagnoses:  osteoarthritis   Discharge Diagnoses: Patient Active Problem List   Diagnosis Date Noted  . Primary osteoarthritis of left knee 03/09/2016  . Osteopenia 11/17/2015  . Migraine without aura and without status migrainosus, not intractable 11/17/2015  . Urge urinary incontinence 11/17/2015  . Dyslipidemia 11/17/2015  . Large breasts 11/17/2015  . Menopause 11/17/2015  . Vitamin D deficiency 11/17/2015  . MVP (mitral valve prolapse) 11/17/2015  . History of lumbar fusion 11/17/2015  . Chronic constipation 11/17/2015  . Venous insufficiency 11/17/2015  . GERD (gastroesophageal reflux disease) 11/17/2015  . Eustachian tube dysfunction 11/17/2015  . Sciatica of left side     Past Medical History  Diagnosis Date  . Vitamin D deficiency   . Venous (peripheral) insufficiency   . Urine incontinence   . Osteopenia   . Left knee pain   . Mitral valve prolapse   . Hyperlipidemia   . Chronic constipation   . GERD (gastroesophageal reflux disease)   . Headache     occasional migraine  . Complication of anesthesia     Bad headache when waking     Transfusion: none   Consultants (if any):    Discharged Condition: Improved  Hospital Course: Bonnie Owens is an 65 y.o. female who was admitted 03/09/2016 with a diagnosis of left knee osteoarthritis and went to the operating room on 03/09/2016 and underwent the above named procedures.    Surgeries: Procedure(s): TOTAL KNEE ARTHROPLASTY on 03/09/2016 Patient tolerated the surgery well. Taken to PACU where she was stabilized and then transferred to the orthopedic floor.  Started on Lovenox 30 mg q 12hrs. Foot pumps applied bilaterally at 80 mm. Heels elevated on bed with rolled towels. No evidence of DVT. Negative Homan. Physical therapy started on day #1 for  gait training and transfer. OT started day #1 for ADL and assisted devices.  Patient's foley was d/c on day #1. Patient's IV  was d/c on day #2.  On post op day #3 patient was stable and ready for discharge to home with HHPT  Implants: Medacta 3+ GMK sphere left femoral component, 2 tibia component was short stem and 20 mm flex insert, size 2 patella all components cemented   She was given perioperative antibiotics:  Anti-infectives    Start     Dose/Rate Route Frequency Ordered Stop   03/09/16 1330  ceFAZolin (ANCEF) IVPB 2g/100 mL premix     2 g 200 mL/hr over 30 Minutes Intravenous Every 6 hours 03/09/16 1142 03/09/16 2209   03/09/16 0630  ceFAZolin (ANCEF) IVPB 2g/100 mL premix     2 g over 30 Minutes Intravenous  Once 03/09/16 0615 03/09/16 0747   03/09/16 0535  ceFAZolin (ANCEF) 2-4 GM/100ML-% IVPB    Comments:  Machia, Millissa: cabinet override      03/09/16 0535 03/09/16 1744    .  She was given sequential compression devices, early ambulation, and lovenox prophylaxis.  She benefited maximally from the hospital stay and there were no complications.    Recent vital signs:  Filed Vitals:   03/11/16 0415 03/11/16 0759  BP: 121/72 124/69  Pulse: 101 84  Temp: 99.6 F (37.6 C) 98.4 F (36.9 C)  Resp: 18 18    Recent laboratory studies:  Lab Results  Component Value Date   HGB 11.2* 03/11/2016   HGB 10.8* 03/10/2016  HGB 12.4 Mar 25, 2016   Lab Results  Component Value Date   WBC 7.4 03/11/2016   PLT 258 03/11/2016   Lab Results  Component Value Date   INR 0.96 02/23/2016   Lab Results  Component Value Date   NA 134* 03/11/2016   K 3.4* 03/11/2016   CL 97* 03/11/2016   CO2 31 03/11/2016   BUN 6 03/11/2016   CREATININE 0.43* 03/11/2016   GLUCOSE 129* 03/11/2016    Discharge Medications:     Medication List    TAKE these medications        aspirin 81 MG tablet  Take 81 mg by mouth daily.     diltiazem 120 MG 24 hr capsule  Commonly known as:   CARDIZEM CD  Take 1 capsule (120 mg total) by mouth daily.     enoxaparin 40 MG/0.4ML injection  Commonly known as:  LOVENOX  Inject 0.4 mLs (40 mg total) into the skin daily. X 14 days     fluticasone 50 MCG/ACT nasal spray  Commonly known as:  FLONASE  Place 2 sprays into both nostrils daily.     ibandronate 150 MG tablet  Commonly known as:  BONIVA  Take 1 tablet (150 mg total) by mouth every 30 (thirty) days.     Omega-3 Krill Oil 300 MG Caps  Take 1 tablet by mouth daily.     omeprazole 20 MG capsule  Commonly known as:  PRILOSEC  Take 1 capsule (20 mg total) by mouth daily.     ondansetron 4 MG tablet  Commonly known as:  ZOFRAN  Take 1 tablet (4 mg total) by mouth every 6 (six) hours as needed for nausea.     OSTEO BI-FLEX JOINT SHIELD PO  Take 1 tablet by mouth daily.     oxybutynin 10 MG 24 hr tablet  Commonly known as:  DITROPAN XL  Take 1 tablet (10 mg total) by mouth at bedtime.     oxyCODONE 5 MG immediate release tablet  Commonly known as:  Oxy IR/ROXICODONE  Take 1-2 tablets (5-10 mg total) by mouth every 3 (three) hours as needed for breakthrough pain.     polyethylene glycol powder powder  Commonly known as:  GLYCOLAX/MIRALAX  Take 17 g by mouth daily.     SUMAtriptan 100 MG tablet  Commonly known as:  IMITREX  Take 1 tablet (100 mg total) by mouth as needed.     VITAMIN C PO  Take 1,000 mg by mouth daily.     VITAMIN D-3 PO  Take 5,000 Units by mouth daily.        Diagnostic Studies: Dg Knee 1-2 Views Left  2016-03-25  CLINICAL DATA:  Postop left knee. EXAM: LEFT KNEE - 1-2 VIEW COMPARISON:  CT 02/09/2016 FINDINGS: Total knee arthroplasty without periprosthetic fracture or subluxation. Linear cortical lucency in the tibial diaphysis correlates with nutrient channel partly seen on comparison CT. IMPRESSION: No acute finding after total knee arthroplasty. Electronically Signed   By: Monte Fantasia M.D.   On: March 25, 2016 10:16    Disposition:  Final discharge disposition not confirmed        Follow-up Information    Follow up with MENZ,MICHAEL, MD In 2 weeks.   Specialty:  Orthopedic Surgery   Why:  For staple removal and skin check   Contact information:   [place] [address] [phone]        Signed: Feliberto Gottron 03/11/2016, 8:57  AM     

## 2016-03-11 NOTE — Progress Notes (Signed)
Physical Therapy Treatment Patient Details Name: Bonnie Owens MRN: [id number] DOB: [birth year] Today's Date: 03/11/2016    History of Present Illness Pt. is a 65 y.o. female who was admitted for a Left TKR    PT Comments    Pt continues to do well with PT but is struggling with good control of quad in against gravity activities (no SLR, difficulty with SAQ) and is reliant on UEs during standing/ambulation when needing to bear weight on L.  She was able to negotiate up/down steps w/o direct physical assistance, but did much better with backward strategy using walker than sideways because of better ability to use UEs to assist with L WBing.  Pt continues to have pain with exercises/ROM, but overall remains very motivated and has made consistent gains.   Follow Up Recommendations  Home health PT     Equipment Recommendations       Recommendations for Other Services       Precautions / Restrictions Precautions Precautions: Fall Restrictions Weight Bearing Restrictions: Yes RLE Weight Bearing: Weight bearing as tolerated    Mobility  Bed Mobility Overal bed mobility: Needs Assistance Bed Mobility: Supine to Sit;Sit to Supine     Supine to sit: Min assist Sit to supine: Min assist   General bed mobility comments: Pt needs assist with L LE both getting out of and back into bed.   Transfers Overall transfer level: Modified independent Equipment used: Rolling walker (2 wheeled) Transfers: Sit to/from Stand           General transfer comment: Pt did not need any physical assist to get up to standing.   Ambulation/Gait Ambulation/Gait assistance: Supervision Ambulation Distance (Feet): 250 Feet Assistive device: Rolling walker (2 wheeled)       General Gait Details: Pt continues to increase speed, confidence and overall cadence.  She remains reliant on WBing through UEs during L stance phase, but has very little buckling and no overt safety issues.     Stairs Stairs: Yes Stairs assistance: Min guard Stair Management: One rail Left;With walker Number of Stairs: 8 General stair comments: Pt able to negotiate up/down 4 steps X 2 with 2 different strategies.  She was more confident with going up backward using the walker than going up sideways secondary to some continued weakness with L quad control.  Wheelchair Mobility    Modified Rankin (Stroke Patients Only)       Balance                                    Cognition Arousal/Alertness: Awake/alert Behavior During Therapy: WFL for tasks assessed/performed Overall Cognitive Status: Within Functional Limits for tasks assessed                      Exercises Total Joint Exercises Ankle Circles/Pumps: Strengthening;15 reps Quad Sets: 15 reps;Strengthening Gluteal Sets: Strengthening;15 reps Short Arc Quad: AAROM;10 reps Heel Slides: 15 reps;Strengthening;AROM Hip ABduction/ADduction: 15 reps;Strengthening;AROM Straight Leg Raises: 5 reps;AAROM;PROM Knee Flexion: PROM;5 reps    General Comments        Pertinent Vitals/Pain Pain Assessment: 0-10 Pain Score: 3  (increases significantly with mobiltiy/exercises) Pain Location: Patient reports receiving pain medication 1-2 hours prior to OT.  Pain Intervention(s): Limited activity within patient's tolerance;Monitored during session;Ice applied    Home Living  Prior Function            PT Goals (current goals can now be found in the care plan section) Acute Rehab PT Goals Patient Stated Goal: To return home, regain independence, and return to gardening. Progress towards PT goals: Progressing toward goals    Frequency  BID    PT Plan Current plan remains appropriate    Co-evaluation       OT goals addressed during session: ADL's and self-care;Proper use of Adaptive equipment and DME     End of Session Equipment Utilized During Treatment: Gait  belt Activity Tolerance: Patient tolerated treatment well Patient left: with bed alarm set;with call bell/phone within reach;with family/visitor present     Time: [id number] PT Time Calculation (min) (ACUTE ONLY): 46 min  Charges:  $Gait Training: 23-37 mins $Therapeutic Exercise: 8-22 mins                    G Codes:     [name], PT, DPT [phone]  [name] 03/11/2016, 5:14 PM

## 2016-03-11 NOTE — Discharge Instructions (Signed)

## 2016-03-11 NOTE — Progress Notes (Signed)
   Subjective: 2 Days Post-Op Procedure(s) (LRB): TOTAL KNEE ARTHROPLASTY (Left) Patient reports pain as 5 on 0-10 scale.   Patient is well, and has had no acute complaints or problems Denies any CP, SOB, ABD pain. We will continue therapy today.  Plan is to go Home after hospital stay.  Objective: Vital signs in last 24 hours: Temp:  [98 F (36.7 C)-99.6 F (37.6 C)] 99.6 F (37.6 C) (04/08 0415) Pulse Rate:  [89-101] 101 (04/08 0415) Resp:  [18-19] 18 (04/08 0415) BP: (121-146)/(70-76) 121/72 mmHg (04/08 0415) SpO2:  [94 %-97 %] 96 % (04/08 0415)  Intake/Output from previous day: 04/07 0701 - 04/08 0700 In: 360 [P.O.:360] Out: -  Intake/Output this shift:     Recent Labs  03/09/16 1312 03/10/16 0528 03/11/16 0431  HGB 12.4 10.8* 11.2*    Recent Labs  03/10/16 0528 03/11/16 0431  WBC 7.1 7.4  RBC 3.43* 3.55*  HCT 31.8* 32.6*  PLT 235 258    Recent Labs  03/10/16 0528 03/11/16 0431  NA 130* 134*  K 3.4* 3.4*  CL 104 97*  CO2 26 31  BUN 11 6  CREATININE 0.45 0.43*  GLUCOSE 111* 129*  CALCIUM 7.6* 8.4*   No results for input(s): LABPT, INR in the last 72 hours.  EXAM General - Patient is Alert, Appropriate and Oriented Extremity - Neurovascular intact Sensation intact distally Intact pulses distally Dorsiflexion/Plantar flexion intact Dressing - dressing C/D/I, no drainage and honeycomb applied Motor Function - intact, moving foot and toes well on exam. Unable to straight leg raise  Past Medical History  Diagnosis Date  . Vitamin D deficiency   . Venous (peripheral) insufficiency   . Urine incontinence   . Osteopenia   . Left knee pain   . Mitral valve prolapse   . Hyperlipidemia   . Chronic constipation   . GERD (gastroesophageal reflux disease)   . Headache     occasional migraine  . Complication of anesthesia     Bad headache when waking    Assessment/Plan:   2 Days Post-Op Procedure(s) (LRB): TOTAL KNEE ARTHROPLASTY  (Left) Active Problems:   Primary osteoarthritis of left knee  Estimated body mass index is 27.06 kg/(m^2) as calculated from the following:   Height as of this encounter: 5\' 2"  (1.575 m).   Weight as of this encounter: 67.132 kg (148 lb). Advance diet Up with therapy  Needs BM Klor kon meq BID Plan on discharge to home with home health Sunday   DVT Prophylaxis - Lovenox, Foot Pumps and TED hose Weight-Bearing as tolerated to left leg D/C O2 and Pulse OX and try on Room Air  T. Rachelle Hora, PA-C [place] 03/11/2016, 7:51 AM

## 2016-03-11 NOTE — Progress Notes (Signed)
Physical Therapy Treatment Patient Details Name: Bonnie Owens MRN: [id number] DOB: 1951/03/26 Today's Date: 03/11/2016    History of Present Illness Pt. is a 65 y.o. female who was admitted for a Left TKR    PT Comments    Pt is able to show improved ambulation, increased ROM and generally does well with exercises.  She is still having considerable pain with most activity, but shows great motivation to work through it and continue her PT.  Pt is at or beyond typical POD2 milestones.    Follow Up Recommendations  Home health PT     Equipment Recommendations       Recommendations for Other Services       Precautions / Restrictions Precautions Precautions: Fall Restrictions RLE Weight Bearing: Weight bearing as tolerated    Mobility  Bed Mobility               General bed mobility comments: Pt in recliner on arrival, not tested  Transfers Overall transfer level: Modified independent Equipment used: Rolling walker (2 wheeled)             General transfer comment: Pt again able to rise with only minimal cuing and CGA using RW,.  Ambulation/Gait Ambulation/Gait assistance: Min guard Ambulation Distance (Feet): 100 Feet Assistive device: Rolling walker (2 wheeled)       General Gait Details: Pt shows good effort with ambulation, though she is still hesitant to fully WBing on the L.  Pt has no buckling, but is still using UEs on AD with L stance phase.  Pt with increased speed and increased confidence today.    Stairs            Wheelchair Mobility    Modified Rankin (Stroke Patients Only)       Balance                                    Cognition Arousal/Alertness: Awake/alert Behavior During Therapy: WFL for tasks assessed/performed Overall Cognitive Status: Within Functional Limits for tasks assessed                      Exercises Total Joint Exercises Ankle Circles/Pumps: 10 reps Quad Sets: 10  reps;Strengthening Gluteal Sets: Strengthening;10 reps Short Arc Quad: AAROM;10 reps Heel Slides: AROM;Strengthening;5 reps Hip ABduction/ADduction: AROM;10 reps Knee Flexion: PROM;5 reps Goniometric ROM: 1-81    General Comments        Pertinent Vitals/Pain Pain Score: 8  (4/10 pain at rest)    Home Living                      Prior Function            PT Goals (current goals can now be found in the care plan section) Progress towards PT goals: Progressing toward goals    Frequency  BID    PT Plan Current plan remains appropriate    Co-evaluation             End of Session Equipment Utilized During Treatment: Gait belt Activity Tolerance: Patient tolerated treatment well       Time: 0930-1000 PT Time Calculation (min) (ACUTE ONLY): 30 min  Charges:  $Gait Training: 8-22 mins $Therapeutic Exercise: 8-22 mins                    G Codes:  [name], PT, DPT [phone]  [name] 03/11/2016, 12:48 PM

## 2016-03-12 MED ORDER — TRAMADOL HCL 50 MG PO TABS: 50.0000 mg | ORAL_TABLET | ORAL | Status: DC | PRN

## 2016-03-12 NOTE — Progress Notes (Signed)
Physical Therapy Treatment Patient Details Name: Bonnie Owens MRN: [id number] DOB: 03-12-1951 Today's Date: 03/12/2016    History of Present Illness Pt. is a 65 y.o. female who was admitted for a Left TKR    PT Comments    Pt has been able to increase ambulate confidence and speed and overall has made good gains and is safe with all mobility, ambulation and stair acts.  Pt eager to go home and should do well with HHPT.   Follow Up Recommendations  Home health PT     Equipment Recommendations       Recommendations for Other Services       Precautions / Restrictions Precautions Precautions: Fall Restrictions RLE Weight Bearing: Weight bearing as tolerated    Mobility  Bed Mobility Overal bed mobility: Modified Independent Bed Mobility: Supine to Sit     Supine to sit: Min guard     General bed mobility comments: Pt is able to get her LEs off the EOB with contralateral LE and some UE use  Transfers Overall transfer level: Modified independent Equipment used: Rolling walker (2 wheeled) Transfers: Sit to/from Stand           General transfer comment: Pt is able to get to standing w/o issue and maintains balance w/o issue  Ambulation/Gait Ambulation/Gait assistance: Supervision Ambulation Distance (Feet): 250 Feet Assistive device: Rolling walker (2 wheeled)       General Gait Details: Pt continues to increase speed, confidence and overall cadence.  She remains reliant on WBing through UEs during L stance phase, with no buckling and the ability maintain forward momentum an many strides.   Stairs Stairs: Yes Stairs assistance: Supervision Stair Management: With walker Number of Stairs: 4 General stair comments: Pt able to negotiate up/down steps with almost no input and husband able to assist.  Wheelchair Mobility    Modified Rankin (Stroke Patients Only)       Balance                                    Cognition  Arousal/Alertness: Awake/alert Behavior During Therapy: WFL for tasks assessed/performed Overall Cognitive Status: Within Functional Limits for tasks assessed                      Exercises Total Joint Exercises Ankle Circles/Pumps: Strengthening;15 reps Quad Sets: 15 reps;Strengthening Gluteal Sets: Strengthening;15 reps Short Arc Quad: AAROM;10 reps Heel Slides: 10 reps;Strengthening Hip ABduction/ADduction: 15 reps;Strengthening;AROM Straight Leg Raises: 10 reps;AAROM;PROM Knee Flexion: PROM;5 reps Goniometric ROM: 1-85    General Comments        Pertinent Vitals/Pain Pain Score:  (min pain at rest, increased with all activityes)    Home Living                      Prior Function            PT Goals (current goals can now be found in the care plan section) Progress towards PT goals: Progressing toward goals    Frequency  BID    PT Plan Current plan remains appropriate    Co-evaluation             End of Session Equipment Utilized During Treatment: Gait belt Activity Tolerance: Patient tolerated treatment well Patient left: with bed alarm set;with call bell/phone within reach;with family/visitor present     Time: 1000-1030 PT Time Calculation (  min) (ACUTE ONLY): 30 min  Charges:  $Gait Training: 8-22 mins $Therapeutic Exercise: 8-22 mins                    G Codes:     [name], PT, DPT [phone]  [name] 03/12/2016, 11:49 AM

## 2016-03-12 NOTE — Progress Notes (Signed)
Pt wheeled to car by staff. 

## 2016-03-12 NOTE — Progress Notes (Signed)
   Subjective: 3 Days Post-Op Procedure(s) (LRB): TOTAL KNEE ARTHROPLASTY (Left) Patient reports pain as mild.   Patient is well, and has had no acute complaints or problems Denies any CP, SOB, ABD pain. We will continue therapy today.  Plan is to go Home after hospital stay.  Objective: Vital signs in last 24 hours: Temp:  [98 F (36.7 C)-98.3 F (36.8 C)] 98.3 F (36.8 C) (04/09 0518) Pulse Rate:  [85-92] 92 (04/09 0518) Resp:  [18] 18 (04/08 1924) BP: (121-127)/(65-76) 121/76 mmHg (04/09 0518) SpO2:  [95 %-98 %] 95 % (04/09 0518)  Intake/Output from previous day: 04/08 0701 - 04/09 0700 In: 720 [P.O.:720] Out: 650 [Urine:650] Intake/Output this shift:     Recent Labs  03/09/16 1312 03/10/16 0528 03/11/16 0431  HGB 12.4 10.8* 11.2*    Recent Labs  03/10/16 0528 03/11/16 0431  WBC 7.1 7.4  RBC 3.43* 3.55*  HCT 31.8* 32.6*  PLT 235 258    Recent Labs  03/10/16 0528 03/11/16 0431  NA 130* 134*  K 3.4* 3.4*  CL 104 97*  CO2 26 31  BUN 11 6  CREATININE 0.45 0.43*  GLUCOSE 111* 129*  CALCIUM 7.6* 8.4*   No results for input(s): LABPT, INR in the last 72 hours.  EXAM General - Patient is Alert, Appropriate and Oriented Extremity - Neurovascular intact Sensation intact distally Intact pulses distally Dorsiflexion/Plantar flexion intact Dressing - dressing C/D/I and scant drainage Motor Function - intact, moving foot and toes well on exam. Unable to straight leg raise  Past Medical History  Diagnosis Date  . Vitamin D deficiency   . Venous (peripheral) insufficiency   . Urine incontinence   . Osteopenia   . Left knee pain   . Mitral valve prolapse   . Hyperlipidemia   . Chronic constipation   . GERD (gastroesophageal reflux disease)   . Headache     occasional migraine  . Complication of anesthesia     Bad headache when waking    Assessment/Plan:   3 Days Post-Op Procedure(s) (LRB): TOTAL KNEE ARTHROPLASTY (Left) Active Problems:  Primary osteoarthritis of left knee  Estimated body mass index is 27.06 kg/(m^2) as calculated from the following:   Height as of this encounter: 5\' 2"  (1.575 m).   Weight as of this encounter: 67.132 kg (148 lb). Advance diet Up with therapy  Discharge home with home health PT today pending BM   DVT Prophylaxis - Lovenox, Foot Pumps and TED hose Weight-Bearing as tolerated to left leg D/C O2 and Pulse OX and try on Room Air  T. Rachelle Hora, PA-C [place] 03/12/2016, 7:59 AM

## 2016-03-12 NOTE — Care Management Note (Signed)
Case Management Note  Patient Details  Name: Bonnie Owens MRN: [id number] Date of Birth: 09-03-1951  Subjective/Objective:       Referral for home health PT was called to Ardeen Fillers at [facility]. [name] is aware of her $60.00 co-pay for Lovenox and can pay this amount. She has a rolling walker at home and does not want a bedside commode. She reports that her husband will transport her home today.              Action/Plan:   Expected Discharge Date:  03/12/16               Expected Discharge Plan:     In-House Referral:     Discharge planning Services  CM Consult  Post Acute Care Choice:  Home Health Choice offered to:  Patient  DME Arranged:    DME Agency:     HH Arranged:  PT HH Agency:     Status of Service:  In process, will continue to follow  Medicare Important Message Given:    Date Medicare IM Given:    Medicare IM give by:    Date Additional Medicare IM Given:    Additional Medicare Important Message give by:     If discussed at [place] of Stay Meetings, dates discussed:    Additional Comments:  Dacota Ruben A, RN 03/12/2016, 8:54 AM

## 2016-03-12 NOTE — Progress Notes (Signed)
DISCHARGE NOTE:  Pt given discharge instructions and prescriptions. Pt verbalized understanding.  

## 2016-03-13 LAB — SURGICAL PATHOLOGY

## 2016-03-14 DIAGNOSIS — M5432 Sciatica, left side: Secondary | ICD-10-CM | POA: Diagnosis not present

## 2016-03-14 DIAGNOSIS — G43909 Migraine, unspecified, not intractable, without status migrainosus: Secondary | ICD-10-CM | POA: Diagnosis not present

## 2016-03-14 DIAGNOSIS — M8580 Other specified disorders of bone density and structure, unspecified site: Secondary | ICD-10-CM | POA: Diagnosis not present

## 2016-03-14 DIAGNOSIS — I872 Venous insufficiency (chronic) (peripheral): Secondary | ICD-10-CM | POA: Diagnosis not present

## 2016-03-14 DIAGNOSIS — I341 Nonrheumatic mitral (valve) prolapse: Secondary | ICD-10-CM | POA: Diagnosis not present

## 2016-03-14 DIAGNOSIS — Z471 Aftercare following joint replacement surgery: Secondary | ICD-10-CM | POA: Diagnosis not present

## 2016-03-16 DIAGNOSIS — M5432 Sciatica, left side: Secondary | ICD-10-CM | POA: Diagnosis not present

## 2016-03-16 DIAGNOSIS — Z471 Aftercare following joint replacement surgery: Secondary | ICD-10-CM | POA: Diagnosis not present

## 2016-03-16 DIAGNOSIS — G43909 Migraine, unspecified, not intractable, without status migrainosus: Secondary | ICD-10-CM | POA: Diagnosis not present

## 2016-03-16 DIAGNOSIS — M8580 Other specified disorders of bone density and structure, unspecified site: Secondary | ICD-10-CM | POA: Diagnosis not present

## 2016-03-16 DIAGNOSIS — I341 Nonrheumatic mitral (valve) prolapse: Secondary | ICD-10-CM | POA: Diagnosis not present

## 2016-03-16 DIAGNOSIS — I872 Venous insufficiency (chronic) (peripheral): Secondary | ICD-10-CM | POA: Diagnosis not present

## 2016-03-17 DIAGNOSIS — G43909 Migraine, unspecified, not intractable, without status migrainosus: Secondary | ICD-10-CM | POA: Diagnosis not present

## 2016-03-17 DIAGNOSIS — I872 Venous insufficiency (chronic) (peripheral): Secondary | ICD-10-CM | POA: Diagnosis not present

## 2016-03-17 DIAGNOSIS — M8580 Other specified disorders of bone density and structure, unspecified site: Secondary | ICD-10-CM | POA: Diagnosis not present

## 2016-03-17 DIAGNOSIS — I341 Nonrheumatic mitral (valve) prolapse: Secondary | ICD-10-CM | POA: Diagnosis not present

## 2016-03-17 DIAGNOSIS — M5432 Sciatica, left side: Secondary | ICD-10-CM | POA: Diagnosis not present

## 2016-03-17 DIAGNOSIS — Z471 Aftercare following joint replacement surgery: Secondary | ICD-10-CM | POA: Diagnosis not present

## 2016-03-20 DIAGNOSIS — M5432 Sciatica, left side: Secondary | ICD-10-CM | POA: Diagnosis not present

## 2016-03-20 DIAGNOSIS — I341 Nonrheumatic mitral (valve) prolapse: Secondary | ICD-10-CM | POA: Diagnosis not present

## 2016-03-20 DIAGNOSIS — M8580 Other specified disorders of bone density and structure, unspecified site: Secondary | ICD-10-CM | POA: Diagnosis not present

## 2016-03-20 DIAGNOSIS — G43909 Migraine, unspecified, not intractable, without status migrainosus: Secondary | ICD-10-CM | POA: Diagnosis not present

## 2016-03-20 DIAGNOSIS — I872 Venous insufficiency (chronic) (peripheral): Secondary | ICD-10-CM | POA: Diagnosis not present

## 2016-03-20 DIAGNOSIS — Z471 Aftercare following joint replacement surgery: Secondary | ICD-10-CM | POA: Diagnosis not present

## 2016-03-22 DIAGNOSIS — M5432 Sciatica, left side: Secondary | ICD-10-CM | POA: Diagnosis not present

## 2016-03-22 DIAGNOSIS — I872 Venous insufficiency (chronic) (peripheral): Secondary | ICD-10-CM | POA: Diagnosis not present

## 2016-03-22 DIAGNOSIS — M8580 Other specified disorders of bone density and structure, unspecified site: Secondary | ICD-10-CM | POA: Diagnosis not present

## 2016-03-22 DIAGNOSIS — Z471 Aftercare following joint replacement surgery: Secondary | ICD-10-CM | POA: Diagnosis not present

## 2016-03-22 DIAGNOSIS — I341 Nonrheumatic mitral (valve) prolapse: Secondary | ICD-10-CM | POA: Diagnosis not present

## 2016-03-22 DIAGNOSIS — G43909 Migraine, unspecified, not intractable, without status migrainosus: Secondary | ICD-10-CM | POA: Diagnosis not present

## 2016-03-23 DIAGNOSIS — I872 Venous insufficiency (chronic) (peripheral): Secondary | ICD-10-CM | POA: Diagnosis not present

## 2016-03-23 DIAGNOSIS — M8580 Other specified disorders of bone density and structure, unspecified site: Secondary | ICD-10-CM | POA: Diagnosis not present

## 2016-03-23 DIAGNOSIS — I341 Nonrheumatic mitral (valve) prolapse: Secondary | ICD-10-CM | POA: Diagnosis not present

## 2016-03-23 DIAGNOSIS — G43909 Migraine, unspecified, not intractable, without status migrainosus: Secondary | ICD-10-CM | POA: Diagnosis not present

## 2016-03-23 DIAGNOSIS — Z471 Aftercare following joint replacement surgery: Secondary | ICD-10-CM | POA: Diagnosis not present

## 2016-03-23 DIAGNOSIS — M5432 Sciatica, left side: Secondary | ICD-10-CM | POA: Diagnosis not present

## 2016-03-24 DIAGNOSIS — Z96652 Presence of left artificial knee joint: Secondary | ICD-10-CM | POA: Diagnosis not present

## 2016-03-31 DIAGNOSIS — Z96652 Presence of left artificial knee joint: Secondary | ICD-10-CM | POA: Diagnosis not present

## 2016-04-03 DIAGNOSIS — Z96652 Presence of left artificial knee joint: Secondary | ICD-10-CM | POA: Diagnosis not present

## 2016-04-05 DIAGNOSIS — Z96652 Presence of left artificial knee joint: Secondary | ICD-10-CM | POA: Diagnosis not present

## 2016-04-07 DIAGNOSIS — Z96652 Presence of left artificial knee joint: Secondary | ICD-10-CM | POA: Diagnosis not present

## 2016-04-10 DIAGNOSIS — Z96652 Presence of left artificial knee joint: Secondary | ICD-10-CM | POA: Diagnosis not present

## 2016-04-12 DIAGNOSIS — Z96652 Presence of left artificial knee joint: Secondary | ICD-10-CM | POA: Diagnosis not present

## 2016-04-14 DIAGNOSIS — Z96652 Presence of left artificial knee joint: Secondary | ICD-10-CM | POA: Diagnosis not present

## 2016-04-17 DIAGNOSIS — Z96652 Presence of left artificial knee joint: Secondary | ICD-10-CM | POA: Diagnosis not present

## 2016-04-19 DIAGNOSIS — Z96652 Presence of left artificial knee joint: Secondary | ICD-10-CM | POA: Diagnosis not present

## 2016-04-21 DIAGNOSIS — Z96652 Presence of left artificial knee joint: Secondary | ICD-10-CM | POA: Diagnosis not present

## 2016-04-24 DIAGNOSIS — Z96652 Presence of left artificial knee joint: Secondary | ICD-10-CM | POA: Diagnosis not present

## 2016-04-26 DIAGNOSIS — Z96652 Presence of left artificial knee joint: Secondary | ICD-10-CM | POA: Diagnosis not present

## 2016-05-03 DIAGNOSIS — Z96652 Presence of left artificial knee joint: Secondary | ICD-10-CM | POA: Diagnosis not present

## 2016-05-05 DIAGNOSIS — Z96652 Presence of left artificial knee joint: Secondary | ICD-10-CM | POA: Diagnosis not present

## 2016-05-09 ENCOUNTER — Other Ambulatory Visit: Payer: Self-pay | Admitting: Family Medicine

## 2016-05-09 DIAGNOSIS — Z96652 Presence of left artificial knee joint: Secondary | ICD-10-CM | POA: Diagnosis not present

## 2016-05-09 DIAGNOSIS — Z1231 Encounter for screening mammogram for malignant neoplasm of breast: Secondary | ICD-10-CM

## 2016-05-11 DIAGNOSIS — Z96652 Presence of left artificial knee joint: Secondary | ICD-10-CM | POA: Diagnosis not present

## 2016-05-22 ENCOUNTER — Ambulatory Visit
Admission: RE | Admit: 2016-05-22 | Discharge: 2016-05-22 | Disposition: A | Discharge status: Home / Self Care | Payer: Managed Care, Other (non HMO) | Source: Ambulatory Visit | Attending: Family Medicine | Admitting: Family Medicine

## 2016-05-22 ENCOUNTER — Other Ambulatory Visit: Payer: Self-pay | Admitting: Family Medicine

## 2016-05-22 DIAGNOSIS — Z1231 Encounter for screening mammogram for malignant neoplasm of breast: Secondary | ICD-10-CM | POA: Insufficient documentation

## 2016-06-14 DIAGNOSIS — H2513 Age-related nuclear cataract, bilateral: Secondary | ICD-10-CM | POA: Diagnosis not present

## 2016-06-20 ENCOUNTER — Other Ambulatory Visit: Payer: Self-pay | Admitting: Family Medicine

## 2016-06-20 NOTE — Telephone Encounter (Signed)
Patient requesting refill. 

## 2016-07-25 ENCOUNTER — Other Ambulatory Visit: Payer: Self-pay | Admitting: Family Medicine

## 2016-09-29 ENCOUNTER — Ambulatory Visit: Payer: Self-pay | Admitting: Physician Assistant

## 2016-09-29 VITALS — BP 110/79 | HR 78 | Temp 98.1°F

## 2016-09-29 DIAGNOSIS — Z299 Encounter for prophylactic measures, unspecified: Secondary | ICD-10-CM

## 2016-09-30 ENCOUNTER — Other Ambulatory Visit: Payer: Self-pay | Admitting: Physician Assistant

## 2016-10-02 NOTE — Telephone Encounter (Signed)
Med refill approved 

## 2016-10-04 HISTORY — PX: BASAL CELL CARCINOMA EXCISION: SHX1214

## 2016-11-02 DIAGNOSIS — C44119 Basal cell carcinoma of skin of left eyelid, including canthus: Secondary | ICD-10-CM | POA: Diagnosis not present

## 2016-11-02 DIAGNOSIS — C44319 Basal cell carcinoma of skin of other parts of face: Secondary | ICD-10-CM | POA: Diagnosis not present

## 2016-11-02 DIAGNOSIS — L578 Other skin changes due to chronic exposure to nonionizing radiation: Secondary | ICD-10-CM | POA: Diagnosis not present

## 2016-11-02 DIAGNOSIS — D485 Neoplasm of uncertain behavior of skin: Secondary | ICD-10-CM | POA: Diagnosis not present

## 2016-11-13 DIAGNOSIS — Z96652 Presence of left artificial knee joint: Secondary | ICD-10-CM | POA: Diagnosis not present

## 2016-11-20 ENCOUNTER — Encounter: Payer: Self-pay | Admitting: Family Medicine

## 2016-11-20 ENCOUNTER — Ambulatory Visit (INDEPENDENT_AMBULATORY_CARE_PROVIDER_SITE_OTHER): Payer: Medicare HMO | Admitting: Family Medicine

## 2016-11-20 VITALS — BP 122/72 | HR 99 | Temp 98.0°F | Resp 18 | Ht 63.0 in | Wt 146.8 lb

## 2016-11-20 DIAGNOSIS — Z0001 Encounter for general adult medical examination with abnormal findings: Secondary | ICD-10-CM | POA: Diagnosis not present

## 2016-11-20 DIAGNOSIS — Z96652 Presence of left artificial knee joint: Secondary | ICD-10-CM

## 2016-11-20 DIAGNOSIS — Z85828 Personal history of other malignant neoplasm of skin: Secondary | ICD-10-CM | POA: Insufficient documentation

## 2016-11-20 DIAGNOSIS — I341 Nonrheumatic mitral (valve) prolapse: Secondary | ICD-10-CM

## 2016-11-20 DIAGNOSIS — K5909 Other constipation: Secondary | ICD-10-CM | POA: Diagnosis not present

## 2016-11-20 DIAGNOSIS — G43009 Migraine without aura, not intractable, without status migrainosus: Secondary | ICD-10-CM | POA: Diagnosis not present

## 2016-11-20 DIAGNOSIS — M8580 Other specified disorders of bone density and structure, unspecified site: Secondary | ICD-10-CM | POA: Diagnosis not present

## 2016-11-20 DIAGNOSIS — K219 Gastro-esophageal reflux disease without esophagitis: Secondary | ICD-10-CM | POA: Diagnosis not present

## 2016-11-20 DIAGNOSIS — N3941 Urge incontinence: Secondary | ICD-10-CM | POA: Diagnosis not present

## 2016-11-20 DIAGNOSIS — Z79899 Other long term (current) drug therapy: Secondary | ICD-10-CM

## 2016-11-20 DIAGNOSIS — E559 Vitamin D deficiency, unspecified: Secondary | ICD-10-CM | POA: Diagnosis not present

## 2016-11-20 DIAGNOSIS — Z0000 Encounter for general adult medical examination without abnormal findings: Secondary | ICD-10-CM

## 2016-11-20 DIAGNOSIS — Z23 Encounter for immunization: Secondary | ICD-10-CM

## 2016-11-20 DIAGNOSIS — E785 Hyperlipidemia, unspecified: Secondary | ICD-10-CM

## 2016-11-20 LAB — CBC WITH DIFFERENTIAL/PLATELET
BASOS ABS: 60 {cells}/uL (ref 0–200)
Basophils Relative: 1 %
EOS ABS: 60 {cells}/uL (ref 15–500)
Eosinophils Relative: 1 %
HCT: 46.2 % — ABNORMAL HIGH (ref 35.0–45.0)
Hemoglobin: 15.3 g/dL (ref 11.7–15.5)
Lymphocytes Relative: 31 %
Lymphs Abs: 1860 cells/uL (ref 850–3900)
MCH: 31.7 pg (ref 27.0–33.0)
MCHC: 33.1 g/dL (ref 32.0–36.0)
MCV: 95.9 fL (ref 80.0–100.0)
MPV: 8.5 fL (ref 7.5–12.5)
Monocytes Absolute: 480 cells/uL (ref 200–950)
Monocytes Relative: 8 %
NEUTROS ABS: 3540 {cells}/uL (ref 1500–7800)
NEUTROS PCT: 59 %
PLATELETS: 344 10*3/uL (ref 140–400)
RBC: 4.82 MIL/uL (ref 3.80–5.10)
RDW: 13.2 % (ref 11.0–15.0)
WBC: 6 10*3/uL (ref 3.8–10.8)

## 2016-11-20 LAB — LIPID PANEL
CHOLESTEROL: 259 mg/dL — AB (ref <200)
HDL: 58 mg/dL (ref >50)
LDL Cholesterol: 176 mg/dL — ABNORMAL HIGH (ref <100)
Total CHOL/HDL Ratio: 4.5 Ratio (ref <5.0)
Triglycerides: 127 mg/dL (ref <150)
VLDL: 25 mg/dL (ref <30)

## 2016-11-20 LAB — COMPLETE METABOLIC PANEL WITH GFR
ALBUMIN: 4.5 g/dL (ref 3.6–5.1)
ALK PHOS: 67 U/L (ref 33–130)
ALT: 10 U/L (ref 6–29)
AST: 16 U/L (ref 10–35)
BILIRUBIN TOTAL: 0.6 mg/dL (ref 0.2–1.2)
BUN: 16 mg/dL (ref 7–25)
CALCIUM: 9.9 mg/dL (ref 8.6–10.4)
CO2: 27 mmol/L (ref 20–31)
CREATININE: 0.66 mg/dL (ref 0.50–0.99)
Chloride: 101 mmol/L (ref 98–110)
GFR, Est African American: >89 mL/min (ref >=60)
GFR, Est Non African American: >89 mL/min (ref >=60)
GLUCOSE: 97 mg/dL (ref 65–99)
POTASSIUM: 5.2 mmol/L (ref 3.5–5.3)
SODIUM: 139 mmol/L (ref 135–146)
TOTAL PROTEIN: 7.3 g/dL (ref 6.1–8.1)

## 2016-11-20 MED ORDER — DILTIAZEM HCL ER COATED BEADS 120 MG PO CP24: 120.0000 mg | ORAL_CAPSULE | Freq: Every day | ORAL | 12 refills | Status: DC

## 2016-11-20 MED ORDER — POLYETHYLENE GLYCOL 3350 17 GM/SCOOP PO POWD: 17.0000 g | Freq: Every day | ORAL | 5 refills | Status: DC

## 2016-11-20 MED ORDER — OXYBUTYNIN CHLORIDE ER 10 MG PO TB24: 10.0000 mg | ORAL_TABLET | Freq: Every day | ORAL | 12 refills | Status: DC

## 2016-11-20 MED ORDER — SUMATRIPTAN SUCCINATE 100 MG PO TABS: 100.0000 mg | ORAL_TABLET | ORAL | 5 refills | Status: DC | PRN

## 2016-11-20 NOTE — Progress Notes (Signed)
Name: Bonnie Owens   MRN: [id number]    DOB: 12/20/1950   Date:11/20/2016       Progress Note  Subjective  Chief Complaint  Chief Complaint  Patient presents with  . Annual Exam    HPI  Functional ability/safety issues: No Issues Hearing issues: Addressed  Activities of daily living: Discussed Home safety issues: No Issues  End Of Life Planning: Offered verbal information regarding advanced directives, healthcare power of attorney.  Preventative care, Health maintenance, Preventative health measures discussed.  Preventative screenings discussed today: lab work, colonoscopy,  mammogram, DEXA.  Low Dose CT Chest recommended if Age 4-80 years, 30 pack-year currently smoking OR have quit w/in 15years.   Lifestyle risk factor issued reviewed: Diet, exercise, weight management, advised patient smoking is not healthy, nutrition/diet.  Preventative health measures discussed (5-10 year plan).  Reviewed and recommended vaccinations: - Pneumovax  - Prevnar  - Annual Influenza - Zostavax - Tdap   Depression screening: Done Fall risk screening: Done Discuss ADLs/IADLs: Done  Current medical providers: See HPI  Other health risk factors identified this visit: No other issues Cognitive impairment issues: None identified  All above discussed with patient. Appropriate education, counseling and referral will be made based upon the above.   MVP: she sees Dr. Ubaldo Glassing once a year. She is on Cardizem, she has constipation as a side effect,, but under control with Miralax - she takes it a few times a week. Denies palpitation, chest pain or SOB. She states only has palpitation when very tired.   Migraine without: she was doing well, but had a few episodes last month. Pain is usually right frontal area, described as sharp and throbbing, skin gets sensitive to touch. Occasionally associated with nausea. Imitrex works well if she takes it right away. She has photophobia and  phonophobia  GERD: doing well now, only taking otc prn medication   Primary OA left knee: s/p left knee replacement in April, finished PT, healing well. She denies knee pain at this time.   Osteopenia: she has been taking Boniva for many years, discussed drug holiday, we will stop medication one year before we recheck bone density  Urge Incontinence: taking medication, she still has nocturia - twice per night, but she drinks water before going to bed. She states medication seems to help with symptoms. No accidents.    Patient Active Problem List   Diagnosis Date Noted  . History of basal cell carcinoma 11/20/2016  . Primary osteoarthritis of left knee 03/09/2016  . History of total knee replacement, left 03/09/2016  . Osteopenia 11/17/2015  . Migraine without aura and without status migrainosus, not intractable 11/17/2015  . Urge urinary incontinence 11/17/2015  . Dyslipidemia 11/17/2015  . Large breasts 11/17/2015  . Menopause 11/17/2015  . Vitamin D deficiency 11/17/2015  . MVP (mitral valve prolapse) 11/17/2015  . History of lumbar fusion 11/17/2015  . Chronic constipation 11/17/2015  . Venous insufficiency 11/17/2015  . GERD (gastroesophageal reflux disease) 11/17/2015  . Eustachian tube dysfunction 11/17/2015  . Sciatica of left side     Past Surgical History:  Procedure Laterality Date  . ABDOMINAL HYSTERECTOMY    . APPENDECTOMY    . BASAL CELL CARCINOMA EXCISION Left 10/2016   Underneath Left Eye- [place] [facility]- Dr. Nehemiah Massed  . lumbar descectomy    . sling procedure    . TOTAL KNEE ARTHROPLASTY Left 03/09/2016   Procedure: TOTAL KNEE ARTHROPLASTY;  Surgeon: Hessie Knows, MD;  Location: [facility] ORS;  Service: Orthopedics;  Laterality: Left;  . TUBAL LIGATION    . tummy tuck      Family History  Problem Relation Age of Onset  . Breast cancer Sister 59  . Cancer Sister     breast  . Berenice Primas' disease Sister   . Dementia Mother   . Cancer Father   . Thyroid disease  Brother   . Breast cancer Maternal Grandmother     Social History   Social History  . Marital status: Married    Spouse name: N/A  . Number of children: N/A  . Years of education: N/A   Occupational History  . Not on file.   Social History Main Topics  . Smoking status: Never Smoker  . Smokeless tobacco: Never Used  . Alcohol use 0.6 oz/week    1 Glasses of wine per week  . Drug use: No  . Sexual activity: Yes    Partners: Male   Other Topics Concern  . Not on file   Social History Narrative  . No narrative on file     Current Outpatient Prescriptions:  .  Ascorbic Acid (VITAMIN C PO), Take 1,000 mg by mouth daily., Disp: , Rfl:  .  aspirin 81 MG tablet, Take 81 mg by mouth daily., Disp: , Rfl:  .  Cholecalciferol (VITAMIN D-3 PO), Take 5,000 Units by mouth daily., Disp: , Rfl:  .  diltiazem (CARDIZEM CD) 120 MG 24 hr capsule, Take 1 capsule (120 mg total) by mouth daily., Disp: 30 capsule, Rfl: 12 .  fluticasone (FLONASE) 50 MCG/ACT nasal spray, Place 2 sprays into both nostrils daily. (Patient taking differently: Place 2 sprays into both nostrils daily as needed. ), Disp: 16 g, Rfl: 6 .  ibandronate (BONIVA) 150 MG tablet, TAKE ONE (1) TABLET BY MOUTH EVERY 30 DAYS, Disp: 1 tablet, Rfl: 12 .  ibuprofen (ADVIL,MOTRIN) 200 MG tablet, Take by mouth., Disp: , Rfl:  .  Misc Natural Products (OSTEO BI-FLEX JOINT SHIELD PO), Take 1 tablet by mouth daily., Disp: , Rfl:  .  Omega-3 Krill Oil 300 MG CAPS, Take 1 tablet by mouth daily., Disp: , Rfl:  .  oxybutynin (DITROPAN-XL) 10 MG 24 hr tablet, Take 1 tablet (10 mg total) by mouth at bedtime., Disp: 30 tablet, Rfl: 12 .  polyethylene glycol powder (GLYCOLAX/MIRALAX) powder, Take 17 g by mouth daily., Disp: 850 g, Rfl: 5 .  PROAIR HFA 108 (90 Base) MCG/ACT inhaler, USE 2 PUFFS EVERY 4 TO 6 HOURS AS NEEDEDFOR COUGH, Disp: 8.5 g, Rfl: 12 .  SUMAtriptan (IMITREX) 100 MG tablet, Take 1 tablet (100 mg total) by mouth as needed.,  Disp: 10 tablet, Rfl: 5  No Known Allergies   ROS  Constitutional: Negative for fever or weight change.  Respiratory: Negative for cough and shortness of breath.   Cardiovascular: Negative for chest pain or palpitations.  Gastrointestinal: Negative for abdominal pain, no bowel changes.  Musculoskeletal: Negative for gait problem or joint swelling.  Skin: Negative for rash.  Neurological: Negative for dizziness or headache.  No other specific complaints in a complete review of systems (except as listed in HPI above).  Objective  Vitals:   11/20/16 0830  BP: 122/72  Pulse: 99  Resp: 18  Temp: 98 F (36.7 C)  TempSrc: Oral  SpO2: 97%  Weight: 146 lb 12.8 oz (66.6 kg)  Height: 5\' 3"  (1.6 m)    Body mass index is 26 kg/m.  Physical Exam  Constitutional: Patient appears well-developed and well-nourished. No  distress.  HENT: Head: Normocephalic and atraumatic. Ears: B TMs ok, no erythema or effusion; Nose: Nose normal. Mouth/Throat: Oropharynx is clear and moist. No oropharyngeal exudate.  Eyes: Conjunctivae and EOM are normal. Pupils are equal, round, and reactive to light. No scleral icterus.  Neck: Normal range of motion. Neck supple. No JVD present. No thyromegaly present.  Cardiovascular: Normal rate, regular rhythm and normal heart sounds.  No murmur heard. No BLE edema. Pulmonary/Chest: Effort normal and breath sounds normal. No respiratory distress. Abdominal: Soft. Bowel sounds are normal, no distension. There is no tenderness. no masses Breast: no lumps or masses, no nipple discharge or rashes FEMALE GENITALIA:  External genitalia normal External urethra normal Pelvic not done RECTAL: no rectal masses or hemorrhoids Musculoskeletal: left knee shows well healed scar, no effusion, normal extension left knee flexion to 115 degrees Neurological: he is alert and oriented to person, place, and time. No cranial nerve deficit. Coordination, balance, strength, speech and  gait are normal.  Skin: Skin is warm and dry. No rash noted. No erythema.  Psychiatric: Patient has a normal mood and affect. behavior is normal. Judgment and thought content normal.  PHQ2/9: Depression screen [facility] 2/9 11/20/2016 11/17/2015  Decreased Interest 0 0  Down, Depressed, Hopeless 0 0  PHQ - 2 Score 0 0     Fall Risk: Fall Risk  11/20/2016 11/17/2015  Falls in the past year? No No    Functional Status Survey: Is the patient deaf or have difficulty hearing?: No Does the patient have difficulty seeing, even when wearing glasses/contacts?: No Does the patient have difficulty concentrating, remembering, or making decisions?: No Does the patient have difficulty walking or climbing stairs?: No Does the patient have difficulty dressing or bathing?: No Does the patient have difficulty doing errands alone such as visiting a doctor's office or shopping?: No    Assessment & Plan  1. Medicare annual wellness visit, subsequent  Discussed importance of 150 minutes of physical activity weekly, eat two servings of fish weekly, eat one serving of tree nuts ( cashews, pistachios, pecans, almonds.Marland Kitchen) every other day, eat 6 servings of fruit/vegetables daily and drink plenty of water and avoid sweet beverages.   2. Migraine without aura and without status migrainosus, not intractable  - SUMAtriptan (IMITREX) 100 MG tablet; Take 1 tablet (100 mg total) by mouth as needed.  Dispense: 10 tablet; Refill: 5 - diltiazem (CARDIZEM CD) 120 MG 24 hr capsule; Take 1 capsule (120 mg total) by mouth daily.  Dispense: 30 capsule; Refill: 12  3. Dyslipidemia  - Lipid panel  4. Vitamin D deficiency  -Vitamin D deficiency  5. Encounter for long-term (current) use of medications  - COMPLETE METABOLIC PANEL WITH GFR - CBC with Differential/Platelet  6. Gastroesophageal reflux disease without esophagitis  Doing well, taking medication otc prn   7. Osteopenia, unspecified location  Taking  Boniva - VITAMIN D 25 Hydroxy (Vit-D Deficiency, Fractures)  8. History of total knee replacement, left  Dr. Rudene Christians done 03/2016, had PT, doing well now  9. Chronic constipation  - polyethylene glycol powder (GLYCOLAX/MIRALAX) powder; Take 17 g by mouth daily.  Dispense: 850 g; Refill: 5  10. Urge urinary incontinence  - oxybutynin (DITROPAN-XL) 10 MG 24 hr tablet; Take 1 tablet (10 mg total) by mouth at bedtime.  Dispense: 30 tablet; Refill: 12  11. MVP (mitral valve prolapse)  - diltiazem (CARDIZEM CD) 120 MG 24 hr capsule; Take 1 capsule (120 mg total) by mouth daily.  Dispense: 30  capsule; Refill: 12

## 2016-11-21 LAB — VITAMIN D 25 HYDROXY (VIT D DEFICIENCY, FRACTURES): Vit D, 25-Hydroxy: 51 ng/mL (ref 30–100)

## 2017-01-03 ENCOUNTER — Ambulatory Visit: Payer: Managed Care, Other (non HMO) | Admitting: Physician Assistant

## 2017-01-03 ENCOUNTER — Encounter: Payer: Self-pay | Admitting: Physician Assistant

## 2017-01-03 VITALS — BP 110/79 | HR 80 | Temp 98.1°F

## 2017-01-03 DIAGNOSIS — J0100 Acute maxillary sinusitis, unspecified: Secondary | ICD-10-CM

## 2017-01-03 LAB — POCT INFLUENZA A/B
INFLUENZA A, POC: NEGATIVE
INFLUENZA B, POC: NEGATIVE

## 2017-01-03 MED ORDER — AMOXICILLIN 875 MG PO TABS: 875.0000 mg | ORAL_TABLET | Freq: Two times a day (BID) | ORAL | 0 refills | Status: DC

## 2017-01-03 MED ORDER — FLUTICASONE PROPIONATE 50 MCG/ACT NA SUSP: 2.0000 | Freq: Every day | NASAL | 6 refills | Status: DC

## 2017-01-03 NOTE — Progress Notes (Signed)
S: C/o sinus pain and congestion for 5 days, no fever, chills, cp/sob, v/d; mucus is green and thick, cough is sporadic, c/o of facial and dental pain. Some body aches  Using otc meds:   O: PE: vitals wnl, nad, perrl eomi, normocephalic, tms dull, nasal mucosa red and swollen, throat injected, neck supple no lymph, lungs c t a, cv rrr, neuro intact, flu swab neg  A:  Acute sinusitis   P: drink fluids, continue regular meds , use otc meds of choice, return if not improving in 5 days, return earlier if worsening , amoxil, flonase

## 2017-01-03 NOTE — Addendum Note (Signed)
Addended by: Rudene Anda T on: 01/03/2017 02:38 PM   Modules accepted: Orders

## 2017-02-01 DIAGNOSIS — D229 Melanocytic nevi, unspecified: Secondary | ICD-10-CM | POA: Diagnosis not present

## 2017-02-01 DIAGNOSIS — Z85828 Personal history of other malignant neoplasm of skin: Secondary | ICD-10-CM | POA: Diagnosis not present

## 2017-02-01 DIAGNOSIS — D692 Other nonthrombocytopenic purpura: Secondary | ICD-10-CM | POA: Diagnosis not present

## 2017-02-01 DIAGNOSIS — L821 Other seborrheic keratosis: Secondary | ICD-10-CM | POA: Diagnosis not present

## 2017-02-01 DIAGNOSIS — L812 Freckles: Secondary | ICD-10-CM | POA: Diagnosis not present

## 2017-02-01 DIAGNOSIS — L578 Other skin changes due to chronic exposure to nonionizing radiation: Secondary | ICD-10-CM | POA: Diagnosis not present

## 2017-03-19 DIAGNOSIS — R0789 Other chest pain: Secondary | ICD-10-CM | POA: Diagnosis not present

## 2017-03-19 DIAGNOSIS — R0602 Shortness of breath: Secondary | ICD-10-CM | POA: Diagnosis not present

## 2017-03-19 DIAGNOSIS — R42 Dizziness and giddiness: Secondary | ICD-10-CM | POA: Diagnosis not present

## 2017-03-19 DIAGNOSIS — R002 Palpitations: Secondary | ICD-10-CM | POA: Insufficient documentation

## 2017-03-20 ENCOUNTER — Other Ambulatory Visit: Payer: Self-pay | Admitting: Family Medicine

## 2017-03-20 DIAGNOSIS — Z1231 Encounter for screening mammogram for malignant neoplasm of breast: Secondary | ICD-10-CM

## 2017-03-26 DIAGNOSIS — R42 Dizziness and giddiness: Secondary | ICD-10-CM | POA: Diagnosis not present

## 2017-03-27 DIAGNOSIS — R0789 Other chest pain: Secondary | ICD-10-CM | POA: Diagnosis not present

## 2017-03-29 DIAGNOSIS — R0602 Shortness of breath: Secondary | ICD-10-CM | POA: Diagnosis not present

## 2017-05-14 ENCOUNTER — Encounter: Payer: Self-pay | Admitting: Family Medicine

## 2017-05-14 ENCOUNTER — Other Ambulatory Visit: Payer: Self-pay | Admitting: Family Medicine

## 2017-05-14 ENCOUNTER — Ambulatory Visit (INDEPENDENT_AMBULATORY_CARE_PROVIDER_SITE_OTHER): Payer: Medicare HMO | Admitting: Family Medicine

## 2017-05-14 VITALS — BP 120/84 | HR 80 | Temp 98.1°F | Resp 16 | Wt 148.5 lb

## 2017-05-14 DIAGNOSIS — J069 Acute upper respiratory infection, unspecified: Secondary | ICD-10-CM | POA: Diagnosis not present

## 2017-05-14 DIAGNOSIS — R059 Cough, unspecified: Secondary | ICD-10-CM

## 2017-05-14 DIAGNOSIS — J029 Acute pharyngitis, unspecified: Secondary | ICD-10-CM | POA: Diagnosis not present

## 2017-05-14 DIAGNOSIS — R238 Other skin changes: Secondary | ICD-10-CM

## 2017-05-14 DIAGNOSIS — R05 Cough: Secondary | ICD-10-CM

## 2017-05-14 DIAGNOSIS — R233 Spontaneous ecchymoses: Secondary | ICD-10-CM

## 2017-05-14 LAB — POCT RAPID STREP A (OFFICE): Rapid Strep A Screen: NEGATIVE

## 2017-05-14 MED ORDER — BENZONATATE 100 MG PO CAPS: 100.0000 mg | ORAL_CAPSULE | Freq: Two times a day (BID) | ORAL | 0 refills | Status: DC | PRN

## 2017-05-14 NOTE — Patient Instructions (Addendum)

## 2017-05-14 NOTE — Progress Notes (Addendum)
Name: Bonnie Owens   MRN: [id number]    DOB: 08/07/1951   Date:05/14/2017       Progress Note  Subjective  Chief Complaint  Chief Complaint  Patient presents with  . Sore Throat    hard to swollow, scratchy, bodyaches    HPI  Pt presents with 4 day history of sore throat, hoarse voice, body aches, left ear fullness. No recent sick contacts; endorses subjective fevers with some night sweats, and mild fatigue.  Very occasional cough, clearing throat often.  No nasal congestion, sinus pain/pressure, no NVD, shortness of breath, or chest pain.  She is leaving on in 6 days for the beach.   Easy Bruising: Pt notes has been starting to bruise easily over the last 2 months. Pt has 3 bruises in various stages of healing on bilateral arms which she is unsure of the source of bruising (no injury etc). No hematochezia or melena, no abdominal pain. She notes one bruise on her hand formed after picking up a gallon of milk at the grocery store.  Patient Active Problem List   Diagnosis Date Noted  . History of basal cell carcinoma 11/20/2016  . Primary osteoarthritis of left knee 03/09/2016  . History of total knee replacement, left 03/09/2016  . Osteopenia 11/17/2015  . Migraine without aura and without status migrainosus, not intractable 11/17/2015  . Urge urinary incontinence 11/17/2015  . Dyslipidemia 11/17/2015  . Large breasts 11/17/2015  . Menopause 11/17/2015  . Vitamin D deficiency 11/17/2015  . MVP (mitral valve prolapse) 11/17/2015  . History of lumbar fusion 11/17/2015  . Chronic constipation 11/17/2015  . Venous insufficiency 11/17/2015  . GERD (gastroesophageal reflux disease) 11/17/2015  . Eustachian tube dysfunction 11/17/2015  . Sciatica of left side     Social History  Substance Use Topics  . Smoking status: Never Smoker  . Smokeless tobacco: Never Used  . Alcohol use 0.6 oz/week    1 Glasses of wine per week     Current Outpatient Prescriptions:  .  Ascorbic  Acid (VITAMIN C PO), Take 1,000 mg by mouth daily., Disp: , Rfl:  .  aspirin 81 MG tablet, Take 81 mg by mouth daily., Disp: , Rfl:  .  Cholecalciferol (VITAMIN D-3 PO), Take 5,000 Units by mouth daily., Disp: , Rfl:  .  diltiazem (CARDIZEM CD) 120 MG 24 hr capsule, Take 1 capsule (120 mg total) by mouth daily., Disp: 30 capsule, Rfl: 12 .  fluticasone (FLONASE) 50 MCG/ACT nasal spray, Place 2 sprays into both nostrils daily., Disp: 16 g, Rfl: 6 .  ibuprofen (ADVIL,MOTRIN) 200 MG tablet, Take by mouth., Disp: , Rfl:  .  Misc Natural Products (OSTEO BI-FLEX JOINT SHIELD PO), Take 1 tablet by mouth daily., Disp: , Rfl:  .  Omega-3 Krill Oil 300 MG CAPS, Take 1 tablet by mouth daily., Disp: , Rfl:  .  benzonatate (TESSALON) 100 MG capsule, Take 1 capsule (100 mg total) by mouth 2 (two) times daily as needed for cough., Disp: 30 capsule, Rfl: 0 .  ibandronate (BONIVA) 150 MG tablet, TAKE ONE (1) TABLET BY MOUTH EVERY 30 DAYS, Disp: 1 tablet, Rfl: 12 .  oxybutynin (DITROPAN-XL) 10 MG 24 hr tablet, Take 1 tablet (10 mg total) by mouth at bedtime. (Patient not taking: Reported on 05/14/2017), Disp: 30 tablet, Rfl: 12 .  polyethylene glycol powder (GLYCOLAX/MIRALAX) powder, Take 17 g by mouth daily. (Patient not taking: Reported on 05/14/2017), Disp: 850 g, Rfl: 5 .  PROAIR HFA 108 (  90 Base) MCG/ACT inhaler, USE 2 PUFFS EVERY 4 TO 6 HOURS AS NEEDEDFOR COUGH (Patient not taking: Reported on 05/14/2017), Disp: 8.5 g, Rfl: 12 .  SUMAtriptan (IMITREX) 100 MG tablet, Take 1 tablet (100 mg total) by mouth as needed. (Patient not taking: Reported on 05/14/2017), Disp: 10 tablet, Rfl: 5  No Known Allergies  ROS Ten systems reviewed and is negative except as mentioned in HPI  Objective  Vitals:   05/14/17 1142  BP: 120/84  Pulse: 80  Resp: 16  Temp: 98.1 F (36.7 C)  TempSrc: Oral  SpO2: 97%  Weight: 148 lb 8 oz (67.4 kg)    Body mass index is 26.31 kg/m.  Nursing Note and Vital Signs  reviewed.  Physical Exam  Musculoskeletal:       Arms:    Constitutional: Patient appears well-developed and well-nourished. Obese No distress.  HEENT: head atraumatic, normocephalic, pupils equal and reactive to light, EOM's intact, TM's without erythema or bulging, no maxillary or frontal sinus pain on palpation, neck supple without lymphadenopathy, oropharynx mildly erythematic and moist without exudate Cardiovascular: Normal rate, regular rhythm, S1/S2 present.  No murmur or rub heard. No BLE edema. Skin: Right arm exhibits 2 healing bruises, Left hand exhibits one bruise to the medial base of thumb. Pulmonary/Chest: Effort normal and breath sounds clear. No respiratory distress or retractions. Psychiatric: Patient has a normal mood and affect. behavior is normal. Judgment and thought content normal.  Recent Results (from the past 2160 hour(s))  POCT rapid strep A     Status: None   Collection Time: 05/14/17  3:04 PM  Result Value Ref Range   Rapid Strep A Screen Negative Negative    Assessment & Plan  1. Sore throat - CBC w/Diff/Platelet - POCT Rapid Strep A - Negative. - Discussed supportive measures including cool mist humidifier, drinking plenty of fluids, Tylenol PRN for pain, tessalon as below PRN for cough.  2. Easy bruising - CBC w/Diff/Platelet - Platelet function assay  3. Cough - benzonatate (TESSALON) 100 MG capsule; Take 1 capsule (100 mg total) by mouth 2 (two) times daily as needed for cough.  Dispense: 30 capsule; Refill: 0  -Red flags and when to present for emergency care or RTC including fever >101.33F, chest pain, shortness of breath, new/worsening/un-resolving symptoms, bleeding/blood in stool reviewed with patient at time of visit. Follow up and care instructions discussed and provided in AVS.  I have reviewed this encounter including the documentation in this note and/or discussed this patient with the Johney Maine, FNP, NP-C. I am certifying  that I agree with the content of this note as supervising physician.  Steele Sizer, MD [facility] 05/15/2017, 7:53 AM

## 2017-05-15 LAB — CBC WITH DIFFERENTIAL/PLATELET
BASOS: 0 %
Basophils Absolute: 0 10*3/uL (ref 0.0–0.2)
EOS (ABSOLUTE): 0 10*3/uL (ref 0.0–0.4)
EOS: 1 %
HEMATOCRIT: 43.7 % (ref 34.0–46.6)
HEMOGLOBIN: 14.1 g/dL (ref 11.1–15.9)
Immature Grans (Abs): 0 10*3/uL (ref 0.0–0.1)
Immature Granulocytes: 0 %
LYMPHS ABS: 2 10*3/uL (ref 0.7–3.1)
Lymphs: 34 %
MCH: 31.4 pg (ref 26.6–33.0)
MCHC: 32.3 g/dL (ref 31.5–35.7)
MCV: 97 fL (ref 79–97)
MONOCYTES: 11 %
Monocytes Absolute: 0.6 10*3/uL (ref 0.1–0.9)
NEUTROS ABS: 3.2 10*3/uL (ref 1.4–7.0)
Neutrophils: 54 %
Platelets: 315 10*3/uL (ref 150–379)
RBC: 4.49 x10E6/uL (ref 3.77–5.28)
RDW: 13.4 % (ref 12.3–15.4)
WBC: 5.8 10*3/uL (ref 3.4–10.8)

## 2017-05-16 NOTE — Progress Notes (Signed)
Please call patient and let her know that her CBC looks excellent, and there are no abnormalities.  Let her know that there are many things that can cause easy bruising including use of Ibuprofen, fish oil, and aspirin - all of which she takes. If she is able to use Tylenol instead of Ibuprofen, this may help to decrease the bruising. I want her to stay on the aspirin.  Please let her know that if she wants to stop the fish oil, we would like to put her on a statin as Dr. Ancil Boozer had recommended back in December 2018.  An increase in alcohol consumption may also contribute, so if she drinks alcohol, she may want to decrease intake.  If she has any questions, please let me know.

## 2017-05-23 ENCOUNTER — Ambulatory Visit: Payer: Medicare Other

## 2017-05-29 ENCOUNTER — Ambulatory Visit: Payer: Medicare Other | Attending: Family Medicine

## 2017-06-18 ENCOUNTER — Ambulatory Visit
Admission: RE | Admit: 2017-06-18 | Discharge: 2017-06-18 | Disposition: A | Discharge status: Home / Self Care | Payer: Medicare HMO | Source: Ambulatory Visit | Attending: Family Medicine | Admitting: Family Medicine

## 2017-06-18 DIAGNOSIS — Z1231 Encounter for screening mammogram for malignant neoplasm of breast: Secondary | ICD-10-CM | POA: Diagnosis not present

## 2017-07-30 ENCOUNTER — Other Ambulatory Visit: Payer: Self-pay | Admitting: Family Medicine

## 2017-08-08 DIAGNOSIS — L578 Other skin changes due to chronic exposure to nonionizing radiation: Secondary | ICD-10-CM | POA: Diagnosis not present

## 2017-08-08 DIAGNOSIS — Z1283 Encounter for screening for malignant neoplasm of skin: Secondary | ICD-10-CM | POA: Diagnosis not present

## 2017-08-08 DIAGNOSIS — Z85828 Personal history of other malignant neoplasm of skin: Secondary | ICD-10-CM | POA: Diagnosis not present

## 2017-08-08 DIAGNOSIS — L508 Other urticaria: Secondary | ICD-10-CM | POA: Diagnosis not present

## 2017-08-08 DIAGNOSIS — L821 Other seborrheic keratosis: Secondary | ICD-10-CM | POA: Diagnosis not present

## 2017-08-08 DIAGNOSIS — C44119 Basal cell carcinoma of skin of left eyelid, including canthus: Secondary | ICD-10-CM | POA: Diagnosis not present

## 2017-08-08 DIAGNOSIS — L812 Freckles: Secondary | ICD-10-CM | POA: Diagnosis not present

## 2017-08-08 DIAGNOSIS — T07XXXA Unspecified multiple injuries, initial encounter: Secondary | ICD-10-CM | POA: Diagnosis not present

## 2017-08-08 DIAGNOSIS — D1800 Hemangioma unspecified site: Secondary | ICD-10-CM | POA: Diagnosis not present

## 2017-08-08 DIAGNOSIS — D485 Neoplasm of uncertain behavior of skin: Secondary | ICD-10-CM | POA: Diagnosis not present

## 2017-08-23 DIAGNOSIS — S90112A Contusion of left great toe without damage to nail, initial encounter: Secondary | ICD-10-CM | POA: Diagnosis not present

## 2017-08-28 DIAGNOSIS — C44119 Basal cell carcinoma of skin of left eyelid, including canthus: Secondary | ICD-10-CM | POA: Diagnosis not present

## 2017-09-03 DIAGNOSIS — H43812 Vitreous degeneration, left eye: Secondary | ICD-10-CM | POA: Diagnosis not present

## 2017-09-14 DIAGNOSIS — R69 Illness, unspecified: Secondary | ICD-10-CM | POA: Diagnosis not present

## 2017-10-10 DIAGNOSIS — H43812 Vitreous degeneration, left eye: Secondary | ICD-10-CM | POA: Diagnosis not present

## 2017-11-14 ENCOUNTER — Ambulatory Visit
Admission: RE | Admit: 2017-11-14 | Discharge: 2017-11-14 | Disposition: A | Discharge status: Home / Self Care | Payer: Medicare HMO | Source: Ambulatory Visit | Attending: Internal Medicine | Admitting: Internal Medicine

## 2017-11-14 ENCOUNTER — Encounter
Admission: RE | Disposition: A | Discharge status: Home / Self Care | Payer: Self-pay | Source: Ambulatory Visit | Attending: Internal Medicine

## 2017-11-14 ENCOUNTER — Ambulatory Visit: Payer: Medicare HMO | Admitting: Anesthesiology

## 2017-11-14 ENCOUNTER — Encounter: Payer: Self-pay | Admitting: *Deleted

## 2017-11-14 DIAGNOSIS — E785 Hyperlipidemia, unspecified: Secondary | ICD-10-CM | POA: Diagnosis not present

## 2017-11-14 DIAGNOSIS — Z7982 Long term (current) use of aspirin: Secondary | ICD-10-CM | POA: Insufficient documentation

## 2017-11-14 DIAGNOSIS — K219 Gastro-esophageal reflux disease without esophagitis: Secondary | ICD-10-CM | POA: Insufficient documentation

## 2017-11-14 DIAGNOSIS — K5730 Diverticulosis of large intestine without perforation or abscess without bleeding: Secondary | ICD-10-CM | POA: Diagnosis not present

## 2017-11-14 DIAGNOSIS — Z8371 Family history of colonic polyps: Secondary | ICD-10-CM | POA: Diagnosis not present

## 2017-11-14 DIAGNOSIS — K648 Other hemorrhoids: Secondary | ICD-10-CM | POA: Diagnosis not present

## 2017-11-14 DIAGNOSIS — E559 Vitamin D deficiency, unspecified: Secondary | ICD-10-CM | POA: Diagnosis not present

## 2017-11-14 DIAGNOSIS — Z79899 Other long term (current) drug therapy: Secondary | ICD-10-CM | POA: Diagnosis not present

## 2017-11-14 DIAGNOSIS — I341 Nonrheumatic mitral (valve) prolapse: Secondary | ICD-10-CM | POA: Insufficient documentation

## 2017-11-14 DIAGNOSIS — Z1211 Encounter for screening for malignant neoplasm of colon: Secondary | ICD-10-CM | POA: Diagnosis not present

## 2017-11-14 DIAGNOSIS — M8580 Other specified disorders of bone density and structure, unspecified site: Secondary | ICD-10-CM | POA: Diagnosis not present

## 2017-11-14 DIAGNOSIS — K5909 Other constipation: Secondary | ICD-10-CM | POA: Diagnosis not present

## 2017-11-14 HISTORY — PX: COLONOSCOPY: SHX5424

## 2017-11-14 SURGERY — COLONOSCOPY
Anesthesia: General

## 2017-11-14 MED ORDER — PROPOFOL 10 MG/ML IV BOLUS
INTRAVENOUS | Status: DC | PRN
  Administered 2017-11-14: 20 mg via INTRAVENOUS
  Administered 2017-11-14: 40 mg via INTRAVENOUS
  Administered 2017-11-14: 20 mg via INTRAVENOUS

## 2017-11-14 MED ORDER — LIDOCAINE HCL (PF) 2 % IJ SOLN
INTRAMUSCULAR | Status: AC
  Filled 2017-11-14: qty 10

## 2017-11-14 MED ORDER — PROPOFOL 500 MG/50ML IV EMUL
INTRAVENOUS | Status: DC | PRN
  Administered 2017-11-14: 120 ug/kg/min via INTRAVENOUS

## 2017-11-14 MED ORDER — LIDOCAINE HCL (CARDIAC) 20 MG/ML IV SOLN
INTRAVENOUS | Status: DC | PRN
  Administered 2017-11-14: 2 mL via INTRAVENOUS

## 2017-11-14 MED ORDER — PROPOFOL 10 MG/ML IV BOLUS
INTRAVENOUS | Status: AC
  Filled 2017-11-14: qty 20

## 2017-11-14 MED ORDER — ONDANSETRON HCL 4 MG/2ML IJ SOLN
INTRAMUSCULAR | Status: DC | PRN
  Administered 2017-11-14: 4 mg via INTRAVENOUS

## 2017-11-14 MED ORDER — SODIUM CHLORIDE 0.9 % IV SOLN
INTRAVENOUS | Status: DC
  Administered 2017-11-14: 10:00:00 via INTRAVENOUS

## 2017-11-14 NOTE — H&P (Signed)
Outpatient short stay form Pre-procedure 11/14/2017 10:44 AM Bonnie Owens Bonnie Owens, M.D.  Primary Physician: Steele Sizer, M.D.  Reason for visit: Colon cancer screening  History of present illness:  Patient presents for colonoscopy for screening/surveillance. The patient denies complaints of abdominal pain, significant change in bowel habits, or rectal bleeding. Patient has a family hx of colon polyps in her brother.     Current Facility-Administered Medications:  .  0.9 %  sodium chloride infusion, , Intravenous, Continuous, [place], Benay Pike, MD, Last Rate: 20 mL/hr at 11/14/17 5397  Medications Prior to Admission  Medication Sig Dispense Refill Last Dose  . Ascorbic Acid (VITAMIN C PO) Take 1,000 mg by mouth daily.   Past Week at Unknown time  . Cholecalciferol (VITAMIN D-3 PO) Take 5,000 Units by mouth daily.   Past Week at Unknown time  . diltiazem (CARDIZEM CD) 120 MG 24 hr capsule Take 1 capsule (120 mg total) by mouth daily. 30 capsule 12 11/13/2017 at Unknown time  . fluticasone (FLONASE) 50 MCG/ACT nasal spray Place 2 sprays into both nostrils daily. 16 g 6 Past Week at Unknown time  . ibandronate (BONIVA) 150 MG tablet TAKE ONE TABLET BY MOUTH EVERY 30 DAYS 1 tablet 3 Past Month at Unknown time  . Misc Natural Products (OSTEO BI-FLEX JOINT SHIELD PO) Take 1 tablet by mouth daily.   Past Week at Unknown time  . Omega-3 Krill Oil 300 MG CAPS Take 1 tablet by mouth daily.   Past Week at Unknown time  . polyethylene glycol powder (GLYCOLAX/MIRALAX) powder Take 17 g by mouth daily. 850 g 5 11/13/2017 at Unknown time  . aspirin 81 MG tablet Take 81 mg by mouth daily.   11/13/2017  . ibuprofen (ADVIL,MOTRIN) 200 MG tablet Take by mouth.   Taking  . SUMAtriptan (IMITREX) 100 MG tablet Take 1 tablet (100 mg total) by mouth as needed. (Patient not taking: Reported on 05/14/2017) 10 tablet 5 Not Taking at Unknown time     No Known Allergies   Past Medical History:  Diagnosis Date  .  Chronic constipation   . Complication of anesthesia    Bad headache when waking  . GERD (gastroesophageal reflux disease)   . Headache    occasional migraine  . Hyperlipidemia   . Left knee pain   . Mitral valve prolapse   . Osteopenia   . Urine incontinence   . Venous (peripheral) insufficiency   . Vitamin D deficiency     Review of systems:      Physical Exam  General appearance: alert, cooperative and appears stated age Resp: clear to auscultation bilaterally Cardio: regular rate and rhythm, S1, S2 normal, no murmur, click, rub or gallop GI: soft, non-tender; bowel sounds normal; no masses,  no organomegaly Extremities: extremities normal, atraumatic, no cyanosis or edema     Planned procedures: Colonoscopy. The patient understands the nature of the planned procedure, indications, risks, alternatives and potential complications including but not limited to bleeding, infection, perforation, damage to internal organs and possible oversedation/side effects from anesthesia. The patient agrees and gives consent to proceed.  Please refer to procedure notes for findings, recommendations and patient disposition/instructions.    Bonnie Owens Bonnie Owens, M.D. Gastroenterology 11/14/2017  10:44 AM

## 2017-11-14 NOTE — Op Note (Signed)
[facility] Gastroenterology Patient Name: Bonnie Owens Procedure Date: 11/14/2017 10:41 AM MRN: [id number] Account #: [id number] Date of Birth: 1951-11-14 Admit Type: Outpatient Age: 66 Room: [facility] ENDO ROOM 4 Gender: Female Note Status: Finalized Procedure:            Colonoscopy Indications:          Colon cancer screening in patient at increased risk:                        Family history of 1st-degree relative with colon polyps Providers:            Benay Pike. [name] MD, MD Referring MD:         Bethena Roys. Sowles, MD (Referring MD) Medicines:            Propofol per Anesthesia Complications:        No immediate complications. Procedure:            Pre-Anesthesia Assessment:                       - The risks and benefits of the procedure and the                        sedation options and risks were discussed with the                        patient. All questions were answered and informed                        consent was obtained.                       - Patient identification and proposed procedure were                        verified prior to the procedure by the nurse. The                        procedure was verified in the pre-procedure area in the                        procedure room.                       - ASA Grade Assessment: I - A normal, healthy patient.                       - After reviewing the risks and benefits, the patient                        was deemed in satisfactory condition to undergo the                        procedure.                       After obtaining informed consent, the colonoscope was                        passed under direct vision. Throughout the procedure,  the patient's blood pressure, pulse, and oxygen                        saturations were monitored continuously. The                        Colonoscope was introduced through the anus and                        advanced to the the  cecum, identified by appendiceal                        orifice and ileocecal valve. The patient tolerated the                        procedure well. The colonoscopy was somewhat difficult                        due to restricted mobility of the colon. Successful                        completion of the procedure was aided by applying                        abdominal pressure. The patient tolerated the procedure                        well. The quality of the bowel preparation was good. Findings:      The perianal and digital rectal examinations were normal. Pertinent       negatives include normal sphincter tone and no palpable rectal lesions.      A few small-mouthed diverticula were found in the sigmoid colon.      The entire examined colon appeared normal on direct and retroflexion       views. Impression:           - Diverticulosis in the sigmoid colon.                       - The entire examined colon is normal on direct and                        retroflexion views.                       - No specimens collected. Recommendation:       - Patient has a contact number available for                        emergencies. The signs and symptoms of potential                        delayed complications were discussed with the patient.                        Return to normal activities tomorrow. Written discharge                        instructions were provided to the patient.                       -  Resume previous diet.                       - Continue present medications.                       - Repeat colonoscopy in 5 years for screening purposes.                       - The findings and recommendations were discussed with                        the patient and their spouse. Procedure Code(s):    --- Professional ---                       J4970, Colorectal cancer screening; colonoscopy on                        individual at high risk Diagnosis Code(s):    --- Professional ---                        Z83.71, Family history of colonic polyps                       K57.30, Diverticulosis of large intestine without                        perforation or abscess without bleeding CPT copyright 2016 [employer]. All rights reserved. The codes documented in this report are preliminary and upon coder review may  be revised to meet current compliance requirements. Efrain Sella MD, MD 11/14/2017 11:19:01 AM This report has been signed electronically. Number of Addenda: 0 Note Initiated On: 11/14/2017 10:41 AM Scope Withdrawal Time: 0 hours 8 minutes 44 seconds  Total Procedure Duration: 0 hours 17 minutes 52 seconds       [facility]

## 2017-11-14 NOTE — Interval H&P Note (Signed)
History and Physical Interval Note:  11/14/2017 10:46 AM  Bonnie Owens  has presented today for surgery, with the diagnosis of FAMILY HX.OF COLON POLYPS  The various methods of treatment have been discussed with the patient and family. After consideration of risks, benefits and other options for treatment, the patient has consented to  Procedure(s): COLONOSCOPY (N/A) as a surgical intervention .  The patient's history has been reviewed, patient examined, no change in status, stable for surgery.  I have reviewed the patient's chart and labs.  Questions were answered to the patient's satisfaction.     [place], [place]

## 2017-11-14 NOTE — Anesthesia Post-op Follow-up Note (Signed)
Anesthesia QCDR form completed.        

## 2017-11-14 NOTE — Anesthesia Preprocedure Evaluation (Signed)
Anesthesia Evaluation  Patient identified by MRN, date of birth, ID band Patient awake    Reviewed: Allergy & Precautions, NPO status   Airway Mallampati: II       Dental  (+) Teeth Intact   Pulmonary neg pulmonary ROS,    breath sounds clear to auscultation       Cardiovascular Exercise Tolerance: Good + Peripheral Vascular Disease   Rhythm:Regular Rate:Normal     Neuro/Psych  Headaches, negative psych ROS   GI/Hepatic Neg liver ROS, GERD  Medicated,  Endo/Other  negative endocrine ROS  Renal/GU negative Renal ROS  negative genitourinary   Musculoskeletal   Abdominal Normal abdominal exam  (+)   Peds negative pediatric ROS (+)  Hematology negative hematology ROS (+)   Anesthesia Other Findings   Reproductive/Obstetrics                             Anesthesia Physical Anesthesia Plan  ASA: I  Anesthesia Plan: General   Post-op Pain Management:    Induction: Intravenous  PONV Risk Score and Plan: Ondansetron  Airway Management Planned: Natural Airway and Nasal Cannula  Additional Equipment:   Intra-op Plan:   Post-operative Plan:   Informed Consent: I have reviewed the patients History and Physical, chart, labs and discussed the procedure including the risks, benefits and alternatives for the proposed anesthesia with the patient or authorized representative who has indicated his/her understanding and acceptance.     Plan Discussed with: Surgeon  Anesthesia Plan Comments:         Anesthesia Quick Evaluation

## 2017-11-14 NOTE — Transfer of Care (Signed)
Immediate Anesthesia Transfer of Care Note  Patient: Bonnie Owens  Procedure(s) Performed: COLONOSCOPY (N/A )  Patient Location: PACU  Anesthesia Type:General  Level of Consciousness: awake  Airway & Oxygen Therapy: Patient Spontanous Breathing and Patient connected to nasal cannula oxygen  Post-op Assessment: Report given to RN and Post -op Vital signs reviewed and stable  Post vital signs: Reviewed  Last Vitals:  Vitals:   11/14/17 0923  BP: (!) 141/88  Pulse: 87  Resp: 14  Temp: (!) 36 C  SpO2: 99%    Last Pain:  Vitals:   11/14/17 0923  TempSrc: Tympanic         Complications: No apparent anesthesia complications

## 2017-11-19 ENCOUNTER — Encounter: Payer: Self-pay | Admitting: Internal Medicine

## 2017-11-19 NOTE — Anesthesia Postprocedure Evaluation (Signed)
Anesthesia Post Note  Patient: Bonnie Owens  Procedure(s) Performed: COLONOSCOPY (N/A )  Patient location during evaluation: PACU Anesthesia Type: General Level of consciousness: awake Pain management: pain level controlled Vital Signs Assessment: post-procedure vital signs reviewed and stable Respiratory status: spontaneous breathing Cardiovascular status: stable Anesthetic complications: no     Last Vitals:  Vitals:   11/14/17 1153 11/14/17 1200  BP:    Pulse: 65 66  Resp: 12 15  Temp:    SpO2: 98% 99%    Last Pain:  Vitals:   11/15/17 0732  TempSrc:   PainSc: 0-No pain                 VAN STAVEREN,Atiyana Welte

## 2017-11-20 ENCOUNTER — Encounter: Payer: Self-pay | Admitting: Internal Medicine

## 2017-11-21 ENCOUNTER — Encounter: Payer: Self-pay | Admitting: Family Medicine

## 2017-11-21 ENCOUNTER — Ambulatory Visit (INDEPENDENT_AMBULATORY_CARE_PROVIDER_SITE_OTHER): Payer: Medicare HMO | Admitting: Family Medicine

## 2017-11-21 VITALS — BP 110/68 | HR 89 | Temp 97.8°F | Resp 16 | Ht 63.39 in | Wt 148.5 lb

## 2017-11-21 DIAGNOSIS — K5909 Other constipation: Secondary | ICD-10-CM

## 2017-11-21 DIAGNOSIS — E785 Hyperlipidemia, unspecified: Secondary | ICD-10-CM

## 2017-11-21 DIAGNOSIS — G43009 Migraine without aura, not intractable, without status migrainosus: Secondary | ICD-10-CM | POA: Diagnosis not present

## 2017-11-21 DIAGNOSIS — Z79899 Other long term (current) drug therapy: Secondary | ICD-10-CM

## 2017-11-21 DIAGNOSIS — H6993 Unspecified Eustachian tube disorder, bilateral: Secondary | ICD-10-CM

## 2017-11-21 DIAGNOSIS — Z23 Encounter for immunization: Secondary | ICD-10-CM

## 2017-11-21 DIAGNOSIS — M8580 Other specified disorders of bone density and structure, unspecified site: Secondary | ICD-10-CM

## 2017-11-21 DIAGNOSIS — I341 Nonrheumatic mitral (valve) prolapse: Secondary | ICD-10-CM | POA: Diagnosis not present

## 2017-11-21 DIAGNOSIS — E559 Vitamin D deficiency, unspecified: Secondary | ICD-10-CM

## 2017-11-21 DIAGNOSIS — Z0000 Encounter for general adult medical examination without abnormal findings: Secondary | ICD-10-CM | POA: Diagnosis not present

## 2017-11-21 MED ORDER — DILTIAZEM HCL ER COATED BEADS 120 MG PO CP24: 120.0000 mg | ORAL_CAPSULE | Freq: Every day | ORAL | 12 refills | Status: AC

## 2017-11-21 MED ORDER — FLUTICASONE PROPIONATE 50 MCG/ACT NA SUSP: 2.0000 | Freq: Every day | NASAL | 6 refills | Status: DC

## 2017-11-21 MED ORDER — POLYETHYLENE GLYCOL 3350 17 GM/SCOOP PO POWD: 17.0000 g | Freq: Every day | ORAL | 5 refills | Status: DC

## 2017-11-21 MED ORDER — SUMATRIPTAN SUCCINATE 100 MG PO TABS: 100.0000 mg | ORAL_TABLET | ORAL | 5 refills | Status: DC | PRN

## 2017-11-21 NOTE — Progress Notes (Signed)
Patient: Bonnie Owens, Female    DOB: Mar 31, 1951, 66 y.o.   MRN: [id number]  Visit Date: 11/21/2017  Today's Provider: Loistine Chance, MD   Chief Complaint  Patient presents with  . Medicare Wellness  . Follow-up    Subjective:    HPI Bonnie Owens is a 66 y.o. female who presents today for her Subsequent Annual Wellness Visit.  Patient/Caregiver input: worries at night and can't sleep, also here for follow up   MVP: she sees Dr. Ubaldo Glassing once a year. She is on Cardizem, she has constipation as a side effect, but under control with Miralax - she takes it a few times a week. Denies  chest pain or SOB. She states only has palpitation when very tired. She recently saw Dr. Ubaldo Glassing   Migraine without: she was doing well, but had a few episodes last month. Pain is usually right frontal area, described as sharp and throbbing, skin gets sensitive to touch. Occasionally associated with nausea. Imitrex works well if she takes it right away. She has photophobia and phonophobia  Osteopenia: she has been taking Boniva for many years, discussed drug holiday on her last visit, she stopped taking Boniva last month and we will recheck bone density.   Urge Incontinence: taking medication, she still has nocturia - twice per night, but she drinks water before going to bed. She states she stopped medication and did not noticed a difference in symptoms, so she does not want to resume medication for bladder.   Insomnia: her husband is a Engineer, agricultural, son in Event organiser and worries about them.    Review of Systems  Constitutional: Negative for fever or weight change.  Respiratory: Negative for cough and shortness of breath.   Cardiovascular: Negative for chest pain or palpitations.  Gastrointestinal: Negative for abdominal pain, no bowel changes.  Musculoskeletal: Negative for gait problem or joint swelling.  Skin: Negative for rash.  Neurological: Negative for dizziness , positive for  intermittent headache.  No other specific complaints in a complete review of systems (except as listed in HPI above).  Past Medical History:  Diagnosis Date  . Chronic constipation   . Complication of anesthesia    Bad headache when waking  . GERD (gastroesophageal reflux disease)   . Headache    occasional migraine  . Hyperlipidemia   . Left knee pain   . Mitral valve prolapse   . Osteopenia   . Urine incontinence   . Venous (peripheral) insufficiency   . Vitamin D deficiency     Past Surgical History:  Procedure Laterality Date  . ABDOMINAL HYSTERECTOMY    . APPENDECTOMY    . BASAL CELL CARCINOMA EXCISION Left 10/2016   Underneath Left Eye- [facility]- Dr. Nehemiah Massed  . COLONOSCOPY N/A 11/14/2017   Procedure: COLONOSCOPY;  Surgeon: Toledo, Benay Pike, MD;  Location: [facility] ENDOSCOPY;  Service: Endoscopy;  Laterality: N/A;  . JOINT REPLACEMENT     (LT) TKR 2017  . lumbar descectomy    . sling procedure    . TOTAL KNEE ARTHROPLASTY Left 03/09/2016   Procedure: TOTAL KNEE ARTHROPLASTY;  Surgeon: Hessie Knows, MD;  Location: [facility] ORS;  Service: Orthopedics;  Laterality: Left;  . TUBAL LIGATION    . tummy tuck      Family History  Problem Relation Age of Onset  . Breast cancer Sister 40  . Cancer Sister        breast  . Berenice Primas' disease Sister   . Goiter Sister   .  Dementia Mother   . Cancer Father   . Thyroid disease Brother   . Breast cancer Maternal Grandmother   . Dysfunctional uterine bleeding Daughter     Social History   Socioeconomic History  . Marital status: Married    Spouse name: Coralyn Mark   . Number of children: 2  . Years of education: Not on file  . Highest education level: 12th grade  Social Needs  . Financial resource strain: Not hard at all  . Food insecurity - worry: Never true  . Food insecurity - inability: Never true  . Transportation needs - medical: No  . Transportation needs - non-medical: No  Occupational History  . Occupation: Estate agent.  trainer    Comment: banking - [place]   Tobacco Use  . Smoking status: Never Smoker  . Smokeless tobacco: Never Used  Substance and Sexual Activity  . Alcohol use: Yes    Alcohol/week: 0.6 oz    Types: 1 Glasses of wine per week    Comment: weekly  . Drug use: No  . Sexual activity: Yes    Partners: Male    Birth control/protection: Surgical, Post-menopausal  Other Topics Concern  . Not on file  Social History Narrative  . Not on file    Outpatient Encounter Medications as of 11/21/2017  Medication Sig Note  . albuterol (PROVENTIL HFA) 108 (90 Base) MCG/ACT inhaler Inhale into the lungs.   . Ascorbic Acid (VITAMIN C PO) Take 1,000 mg by mouth daily.   Marland Kitchen aspirin 81 MG tablet Take 81 mg by mouth daily.   . Cholecalciferol (VITAMIN D-3 PO) Take 5,000 Units by mouth daily.   Marland Kitchen diltiazem (CARDIZEM CD) 120 MG 24 hr capsule Take 1 capsule (120 mg total) by mouth daily.   . fluticasone (FLONASE) 50 MCG/ACT nasal spray Place 2 sprays into both nostrils daily.   Marland Kitchen ibandronate (BONIVA) 150 MG tablet TAKE ONE TABLET BY MOUTH EVERY 30 DAYS   . ibuprofen (ADVIL,MOTRIN) 200 MG tablet Take by mouth. 11/20/2016: Received from: [place]: Take 400 mg by mouth as needed for Pain.  . Misc Natural Products (OSTEO BI-FLEX JOINT SHIELD PO) Take 1 tablet by mouth daily.   . Omega-3 Krill Oil 300 MG CAPS Take 1 tablet by mouth daily. 11/17/2015: Received from: [place]: Take by mouth.  . polyethylene glycol powder (GLYCOLAX/MIRALAX) powder Take 17 g by mouth daily.   . SUMAtriptan (IMITREX) 100 MG tablet Take 1 tablet (100 mg total) by mouth as needed.   . vitamin B-12 (CYANOCOBALAMIN) 1000 MCG tablet Take by mouth.   . [DISCONTINUED] diltiazem (CARDIZEM CD) 120 MG 24 hr capsule Take 1 capsule (120 mg total) by mouth daily.   . [DISCONTINUED] fluticasone (FLONASE) 50 MCG/ACT nasal spray Place 2 sprays into both nostrils daily.   .  [DISCONTINUED] polyethylene glycol powder (GLYCOLAX/MIRALAX) powder Take 17 g by mouth daily.   . [DISCONTINUED] SUMAtriptan (IMITREX) 100 MG tablet Take 1 tablet (100 mg total) by mouth as needed.   . [DISCONTINUED] VENTOLIN HFA 108 (90 Base) MCG/ACT inhaler     No facility-administered encounter medications on file as of 11/21/2017.     No Known Allergies  Care Team Updated in EHR: Yes  Last Vision Exam: 10/2017  Wears corrective lenses: Yes - [facility]  Last Dental Exam: twice yearly - [facility]  Last Hearing Exam: around 2016   Wears Hearing Aids: No  Functional Ability / Safety  Screening 1.  Was the timed Get Up and Go test shorter than 30 seconds?  yes 2.  Does the patient need help with the phone, transportation, shopping,      preparing meals, housework, laundry, medications, or managing money?  no 3.  Is the patient's home free of loose throw rugs in walkways, pet beds, electrical cords, etc?   no      Grab bars in the bathroom? No       Handrails on the stairs?   yes      Adequate lighting?   yes 4.  Has the patient noticed any hearing difficulties?   no  Diet Recall and Exercise Regimen: healthy and balanced  Advanced Care Planning:   Patient does have a living will at present time. If patient does have living will, I have requested they bring this to the clinic to be scanned in to their chart. Does patient have a HCPOA?    yes If yes, name and contact information:  Does patient have a living will or MOST form?  yes  Cancer Screenings:  Lung:  Low Dose CT Chest recommended if Age 20-80 years, 30 pack-year currently smoking OR have quit w/in 15years. Patient does not qualify. Breast:  Up to date on Mammogram? Yes  Up to date of Bone Density/Dexa? No Colon:  Yes 2018  Additional Screenings:  Hepatitis B/HIV/Syphillis: not interested  Hepatitis C Screening: is up to date Intimate Partner Violence: negative screen   Objective:   Vitals: BP 110/68  (BP Location: Left Arm, Patient Position: Sitting, Cuff Size: Large)   Pulse 89   Temp 97.8 F (36.6 C) (Oral)   Resp 16   Ht 5' 3.39" (1.61 m)   Wt 148 lb 8 oz (67.4 kg)   SpO2 98%   BMI 25.99 kg/m  Body mass index is 25.99 kg/m.  No exam data present  Physical Exam Constitutional: Patient appears well-developed and well-nourished. Overweight. No distress.  HEENT: head atraumatic, normocephalic, pupils equal and reactive to light, neck supple, throat within normal limits Cardiovascular: Normal rate, regular rhythm and normal heart sounds.  No murmur heard. No BLE edema. Pulmonary/Chest: Effort normal and breath sounds normal. No respiratory distress. Abdominal: Soft.  There is no tenderness. Breast: large nor masses Psychiatric: Patient has a normal mood and affect. behavior is normal. Judgment and thought content normal.  Cognitive Testing - 6-CIT  Correct? Score   What year is it? yes 0 Yes = 0    No = 4  What month is it? yes 0 Yes = 0    No = 3  Remember:     Pia [address]au, [place], [place]     What time is it? yes 0 Yes = 0    No = 3  Count backwards from 20 to 1 yes 0 Correct = 0    1 error = 2   More than 1 error = 4  Say the months of the year in reverse. yes 0 Correct = 0    1 error = 2   More than 1 error = 4  What address did I ask you to remember? yes 0 Correct = 0  1 error = 2    2 error = 4    3 error = 6    4 error = 8    All wrong = 10       TOTAL SCORE  0/28   Interpretation:  Normal  Normal (  0-7) Abnormal (8-28)   Fall Risk: Fall Risk  11/21/2017 11/20/2016 11/17/2015  Falls in the past year? No No No    Depression Screen Depression screen [facility] 2/9 11/21/2017 11/20/2016 11/17/2015  Decreased Interest 0 0 0  Down, Depressed, Hopeless 0 0 0  PHQ - 2 Score 0 0 0    No results found for this or any previous visit (from the past 2160 hour(s)).  Assessment & Plan:   1. Medicare annual wellness visit, subsequent  Discussed importance of 150  minutes of physical activity weekly, eat two servings of fish weekly, eat one serving of tree nuts ( cashews, pistachios, pecans, almonds.Marland Kitchen) every other day, eat 6 servings of fruit/vegetables daily and drink plenty of water and avoid sweet beverages.   2. Migraine without aura and without status migrainosus, not intractable  - diltiazem (CARDIZEM CD) 120 MG 24 hr capsule; Take 1 capsule (120 mg total) by mouth daily.  Dispense: 30 capsule; Refill: 12 - SUMAtriptan (IMITREX) 100 MG tablet; Take 1 tablet (100 mg total) by mouth as needed.  Dispense: 10 tablet; Refill: 5  3. MVP (mitral valve prolapse)  diltiazem (CARDIZEM CD) 120 MG 24 hr capsule; Take 1 capsule (120 mg total) by mouth daily.  Dispense: 30 capsule; Refill: 12  4. Chronic constipation  - polyethylene glycol powder (GLYCOLAX/MIRALAX) powder; Take 17 g by mouth daily.  Dispense: 850 g; Refill: 5  5. Vitamin D deficiency  - VITAMIN D 25 Hydroxy (Vit-D Deficiency, Fractures)  6. Dyslipidemia  - Lipid panel - EKG 12-Lead  7. Osteopenia, unspecified location  - DG Bone Density; Future - TSH  8. Need for pneumococcal vaccination  Up to date   8. Encounter for long-term (current) use of high-risk medication  - COMPLETE METABOLIC PANEL WITH GFR - CBC with Differential/Platelet  10. Disorder of both eustachian tubes  - fluticasone (FLONASE) 50 MCG/ACT nasal spray; Place 2 sprays into both nostrils daily.  Dispense: 16 g; Refill: 6  Immunization History  Administered Date(s) Administered  . Influenza Split 10/03/2012  . Influenza, Seasonal, Injecte, Preservative Fre 10/02/2011  . Influenza,inj,Quad PF,6+ Mos 08/24/2014, 09/16/2015, 09/29/2016  . Influenza-Unspecified 08/24/2014, 09/14/2017  . Pneumococcal Conjugate-13 11/20/2016  . Pneumococcal Polysaccharide-23 04/14/2015  . Tdap 05/16/2010  . Zoster 10/02/2011  . Zoster Recombinat (Shingrix) 09/14/2017    Health Maintenance  Topic Date Due  . MAMMOGRAM   06/19/2019  . TETANUS/TDAP  05/16/2020  . COLONOSCOPY  11/15/2027  . INFLUENZA VACCINE  Completed  . DEXA SCAN  Completed  . Hepatitis C Screening  Completed  . PNA vac Low Risk Adult  Completed    Meds ordered this encounter  Medications  . diltiazem (CARDIZEM CD) 120 MG 24 hr capsule    Sig: Take 1 capsule (120 mg total) by mouth daily.    Dispense:  30 capsule    Refill:  12  . fluticasone (FLONASE) 50 MCG/ACT nasal spray    Sig: Place 2 sprays into both nostrils daily.    Dispense:  16 g    Refill:  6  . polyethylene glycol powder (GLYCOLAX/MIRALAX) powder    Sig: Take 17 g by mouth daily.    Dispense:  850 g    Refill:  5  . SUMAtriptan (IMITREX) 100 MG tablet    Sig: Take 1 tablet (100 mg total) by mouth as needed.    Dispense:  10 tablet    Refill:  5    Current Outpatient Medications:  .  albuterol (  PROVENTIL HFA) 108 (90 Base) MCG/ACT inhaler, Inhale into the lungs., Disp: , Rfl:  .  Ascorbic Acid (VITAMIN C PO), Take 1,000 mg by mouth daily., Disp: , Rfl:  .  aspirin 81 MG tablet, Take 81 mg by mouth daily., Disp: , Rfl:  .  Cholecalciferol (VITAMIN D-3 PO), Take 5,000 Units by mouth daily., Disp: , Rfl:  .  diltiazem (CARDIZEM CD) 120 MG 24 hr capsule, Take 1 capsule (120 mg total) by mouth daily., Disp: 30 capsule, Rfl: 12 .  fluticasone (FLONASE) 50 MCG/ACT nasal spray, Place 2 sprays into both nostrils daily., Disp: 16 g, Rfl: 6 .  ibandronate (BONIVA) 150 MG tablet, TAKE ONE TABLET BY MOUTH EVERY 30 DAYS, Disp: 1 tablet, Rfl: 3 .  ibuprofen (ADVIL,MOTRIN) 200 MG tablet, Take by mouth., Disp: , Rfl:  .  Misc Natural Products (OSTEO BI-FLEX JOINT SHIELD PO), Take 1 tablet by mouth daily., Disp: , Rfl:  .  Omega-3 Krill Oil 300 MG CAPS, Take 1 tablet by mouth daily., Disp: , Rfl:  .  polyethylene glycol powder (GLYCOLAX/MIRALAX) powder, Take 17 g by mouth daily., Disp: 850 g, Rfl: 5 .  SUMAtriptan (IMITREX) 100 MG tablet, Take 1 tablet (100 mg total) by mouth as  needed., Disp: 10 tablet, Rfl: 5 .  vitamin B-12 (CYANOCOBALAMIN) 1000 MCG tablet, Take by mouth., Disp: , Rfl:  Medications Discontinued During This Encounter  Medication Reason  . VENTOLIN HFA 108 (90 Base) MCG/ACT inhaler Completed Course  . diltiazem (CARDIZEM CD) 120 MG 24 hr capsule Reorder  . fluticasone (FLONASE) 50 MCG/ACT nasal spray Reorder  . polyethylene glycol powder (GLYCOLAX/MIRALAX) powder Reorder  . SUMAtriptan (IMITREX) 100 MG tablet Reorder    I have personally reviewed and addressed the Medicare Annual Wellness health risk assessment questionnaire and have noted the following in the patient's chart:  A.         Medical and social history & family history B.         Use of alcohol, tobacco, and illicit drugs  C.         Current medications and supplements D.         Functional and Cognitive ability and status E.         Nutritional status F.         Physical activity G.        Advance directives H.         List of other physicians I.          Hospitalizations, surgeries, and ER visits in previous 12 months J.         [place] such as hearing, vision, cognitive function, and depression L.         Referrals and appointments: bone density   In addition, I have reviewed and discussed with patient certain preventive protocols, quality metrics, and best practice recommendations. A written personalized care plan for preventive services as well as general preventive health recommendations were provided to patient.  See attached scanned questionnaire for additional information.

## 2017-11-21 NOTE — Patient Instructions (Signed)
Preventive Care 65 Years and Older, Female Preventive care refers to lifestyle choices and visits with your health care provider that can promote health and wellness. What does preventive care include?  A yearly physical exam. This is also called an annual well check.  Dental exams once or twice a year.  Routine eye exams. Ask your health care provider how often you should have your eyes checked.  Personal lifestyle choices, including: ? Daily care of your teeth and gums. ? Regular physical activity. ? Eating a healthy diet. ? Avoiding tobacco and drug use. ? Limiting alcohol use. ? Practicing safe sex. ? Taking low-dose aspirin every day. ? Taking vitamin and mineral supplements as recommended by your health care provider. What happens during an annual well check? The services and screenings done by your health care provider during your annual well check will depend on your age, overall health, lifestyle risk factors, and family history of disease. Counseling Your health care provider may ask you questions about your:  Alcohol use.  Tobacco use.  Drug use.  Emotional well-being.  Home and relationship well-being.  Sexual activity.  Eating habits.  History of falls.  Memory and ability to understand (cognition).  Work and work environment.  Reproductive health.  Screening You may have the following tests or measurements:  Height, weight, and BMI.  Blood pressure.  Lipid and cholesterol levels. These may be checked every 5 years, or more frequently if you are over 50 years old.  Skin check.  Lung cancer screening. You may have this screening every year starting at age 55 if you have a 30-pack-year history of smoking and currently smoke or have quit within the past 15 years.  Fecal occult blood test (FOBT) of the stool. You may have this test every year starting at age 50.  Flexible sigmoidoscopy or colonoscopy. You may have a sigmoidoscopy every 5 years or  a colonoscopy every 10 years starting at age 50.  Hepatitis C blood test.  Hepatitis B blood test.  Sexually transmitted disease (STD) testing.  Diabetes screening. This is done by checking your blood sugar (glucose) after you have not eaten for a while (fasting). You may have this done every 1-3 years.  Bone density scan. This is done to screen for osteoporosis. You may have this done starting at age 65.  Mammogram. This may be done every 1-2 years. Talk to your health care provider about how often you should have regular mammograms.  Talk with your health care provider about your test results, treatment options, and if necessary, the need for more tests. Vaccines Your health care provider may recommend certain vaccines, such as:  Influenza vaccine. This is recommended every year.  Tetanus, diphtheria, and acellular pertussis (Tdap, Td) vaccine. You may need a Td booster every 10 years.  Varicella vaccine. You may need this if you have not been vaccinated.  Zoster vaccine. You may need this after age 60.  Measles, mumps, and rubella (MMR) vaccine. You may need at least one dose of MMR if you were born in 1957 or later. You may also need a second dose.  Pneumococcal 13-valent conjugate (PCV13) vaccine. One dose is recommended after age 65.  Pneumococcal polysaccharide (PPSV23) vaccine. One dose is recommended after age 65.  Meningococcal vaccine. You may need this if you have certain conditions.  Hepatitis A vaccine. You may need this if you have certain conditions or if you travel or work in places where you may be exposed to hepatitis   A.  Hepatitis B vaccine. You may need this if you have certain conditions or if you travel or work in places where you may be exposed to hepatitis B.  Haemophilus influenzae type b (Hib) vaccine. You may need this if you have certain conditions.  Talk to your health care provider about which screenings and vaccines you need and how often you  need them. This information is not intended to replace advice given to you by your health care provider. Make sure you discuss any questions you have with your health care provider. Document Released: 12/17/2015 Document Revised: 08/09/2016 Document Reviewed: 09/21/2015 Elsevier Interactive Patient Education  2018 Elsevier Inc.  

## 2017-11-22 LAB — COMPLETE METABOLIC PANEL WITH GFR
AG RATIO: 1.5 (calc) (ref 1.0–2.5)
ALT: 11 U/L (ref 6–29)
AST: 19 U/L (ref 10–35)
Albumin: 4.4 g/dL (ref 3.6–5.1)
Alkaline phosphatase (APISO): 66 U/L (ref 33–130)
BUN: 13 mg/dL (ref 7–25)
CALCIUM: 10 mg/dL (ref 8.6–10.4)
CO2: 30 mmol/L (ref 20–32)
Chloride: 102 mmol/L (ref 98–110)
Creat: 0.63 mg/dL (ref 0.50–0.99)
GFR, EST AFRICAN AMERICAN: 108 mL/min/{1.73_m2} (ref >=60)
GFR, EST NON AFRICAN AMERICAN: 93 mL/min/{1.73_m2} (ref >=60)
GLOBULIN: 2.9 g/dL (ref 1.9–3.7)
Glucose, Bld: 96 mg/dL (ref 65–99)
POTASSIUM: 5 mmol/L (ref 3.5–5.3)
SODIUM: 138 mmol/L (ref 135–146)
TOTAL PROTEIN: 7.3 g/dL (ref 6.1–8.1)
Total Bilirubin: 0.6 mg/dL (ref 0.2–1.2)

## 2017-11-22 LAB — CBC WITH DIFFERENTIAL/PLATELET
BASOS ABS: 52 {cells}/uL (ref 0–200)
BASOS PCT: 0.9 %
EOS ABS: 52 {cells}/uL (ref 15–500)
EOS PCT: 0.9 %
HEMATOCRIT: 45.5 % — AB (ref 35.0–45.0)
Hemoglobin: 15.4 g/dL (ref 11.7–15.5)
Lymphs Abs: 2134 cells/uL (ref 850–3900)
MCH: 31.7 pg (ref 27.0–33.0)
MCHC: 33.8 g/dL (ref 32.0–36.0)
MCV: 93.6 fL (ref 80.0–100.0)
MONOS PCT: 7.6 %
MPV: 8.9 fL (ref 7.5–12.5)
NEUTROS ABS: 3120 {cells}/uL (ref 1500–7800)
Neutrophils Relative %: 53.8 %
PLATELETS: 345 10*3/uL (ref 140–400)
RBC: 4.86 10*6/uL (ref 3.80–5.10)
RDW: 12.2 % (ref 11.0–15.0)
TOTAL LYMPHOCYTE: 36.8 %
WBC: 5.8 10*3/uL (ref 3.8–10.8)
WBCMIX: 441 {cells}/uL (ref 200–950)

## 2017-11-22 LAB — LIPID PANEL
Cholesterol: 258 mg/dL — ABNORMAL HIGH (ref <200)
HDL: 69 mg/dL (ref >50)
LDL CHOLESTEROL (CALC): 166 mg/dL — AB
NON-HDL CHOLESTEROL (CALC): 189 mg/dL — AB (ref <130)
TRIGLYCERIDES: 115 mg/dL (ref <150)
Total CHOL/HDL Ratio: 3.7 (calc) (ref <5.0)

## 2017-11-22 LAB — TSH: TSH: 1.61 mIU/L (ref 0.40–4.50)

## 2017-11-22 LAB — VITAMIN D 25 HYDROXY (VIT D DEFICIENCY, FRACTURES): Vit D, 25-Hydroxy: 38 ng/mL (ref 30–100)

## 2017-11-26 ENCOUNTER — Other Ambulatory Visit: Payer: Self-pay | Admitting: Family Medicine

## 2017-11-26 MED ORDER — ROSUVASTATIN CALCIUM 10 MG PO TABS: 10.0000 mg | ORAL_TABLET | Freq: Every day | ORAL | 0 refills | Status: DC

## 2017-11-26 NOTE — Progress Notes (Unsigned)
eto

## 2017-12-06 IMAGING — CT CT KNEE*L* W/O CM
1 of 3 series · 7 of 14 positions shown, 9 images · non-contrast
Comparison: 05/19/2015

CLINICAL DATA: Left knee pain worsening for 1 year, preoperative
planning CT.

EXAM:
CT OF THE LEFT HIP WITHOUT CONTRAST, LIMITED
CT OF THE LEFT KNEE WITHOUT CONTRAST
CT LEFT ANKLE WITHOUT CONTRAST, LIMITED
TECHNIQUE: Multidetector CT imaging of the left knee was performed according to
the standard protocol. Multiplanar CT image reconstructions were
also generated.

[Series 6: axial st · axial · 0.39mm/px · z∈[+507,+731]mm · 7 of 300 slices shown, 9 images]
[im 38/300  soft-tissue]
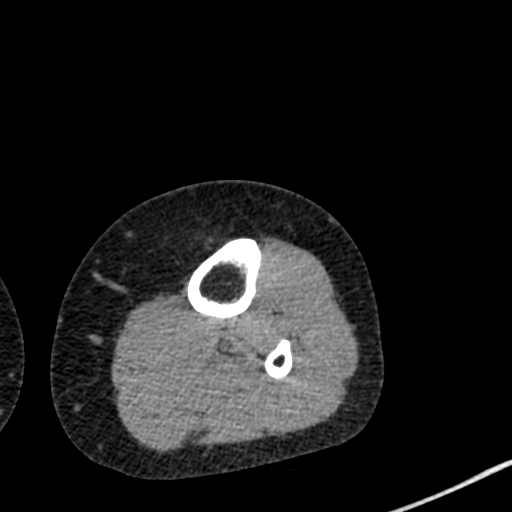
[im 38/300  bone]
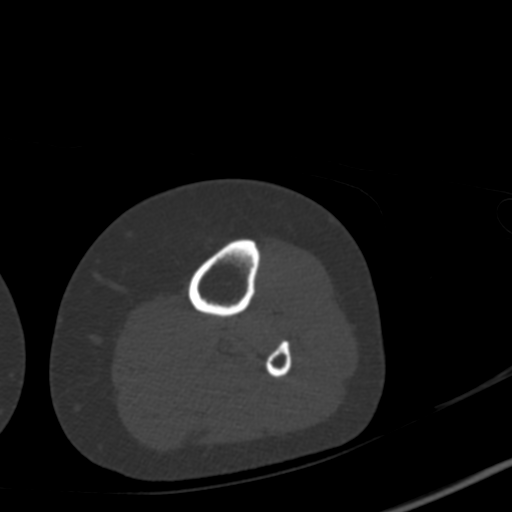
[im 75/300  bone]
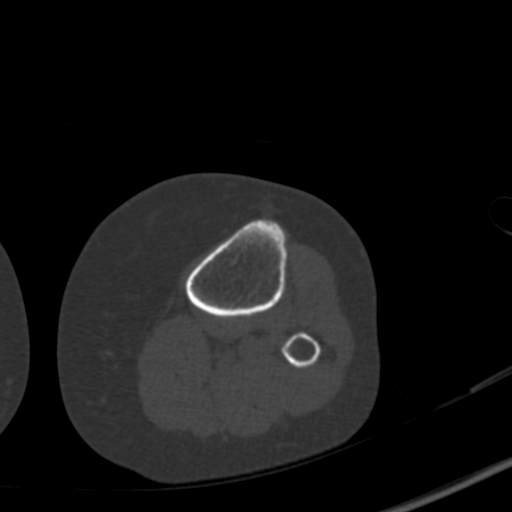
[im 113/300  bone]
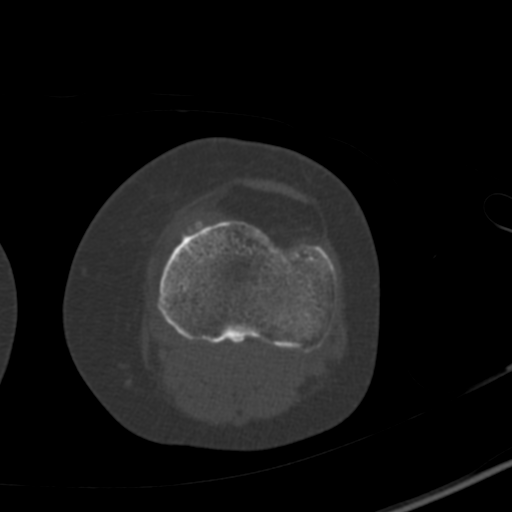
[im 150/300  bone]
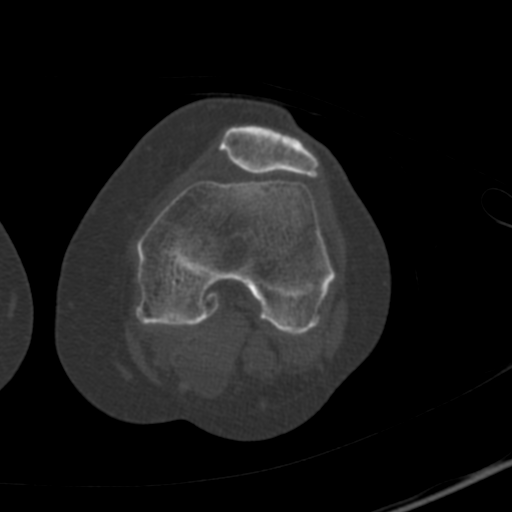
[im 187/300  soft-tissue]
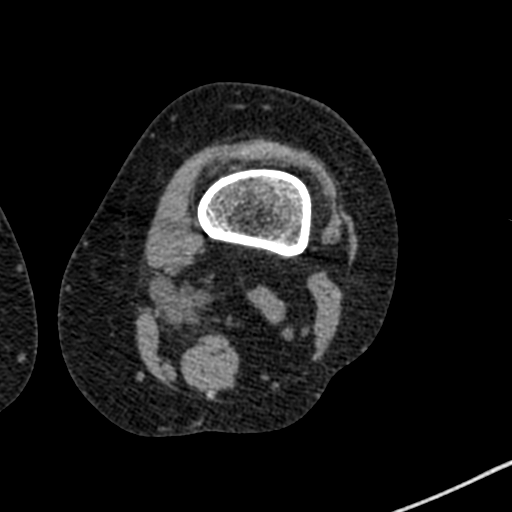
[im 187/300  bone]
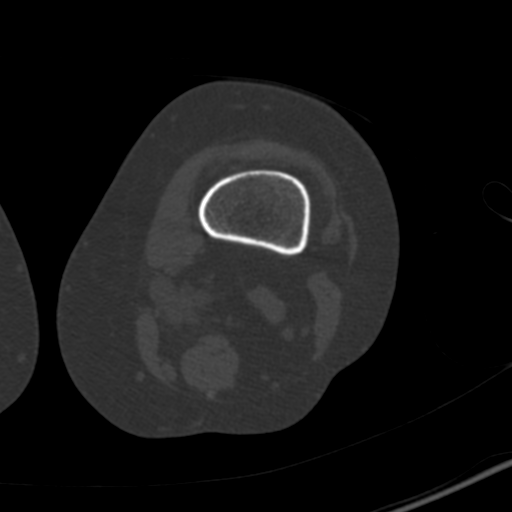
[im 225/300  bone]
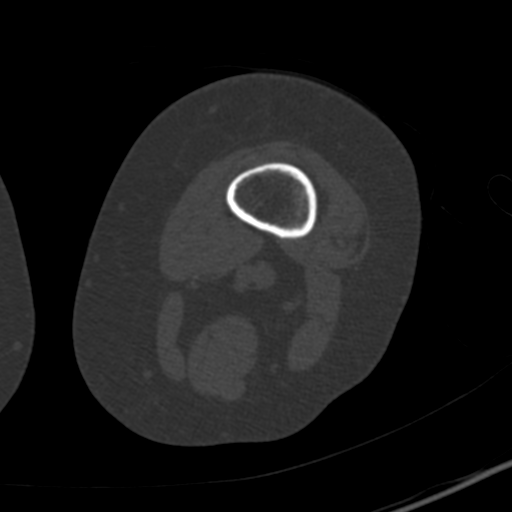
[im 262/300  bone]
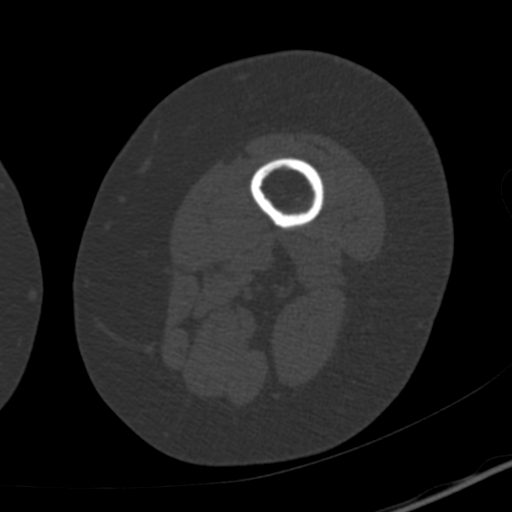

[7 of 14 positions shown; findings below may reference images not displayed]

FINDINGS: Left hip: 2 mm thick axial CT an images were obtained through the
left hip and demonstrate mild to moderate degenerative articular
cartilage thinning. No significant abnormality of the left
sacroiliac joint. No sciatic notch or obturator impingement. There
is some mild chondrocalcinosis of the pubic symphysis. Uterus
absent. Regional musculature unremarkable.

Left knee: Severe osteoarthritis noted with marked medial
compartmental articular space narrowing, subcortical sclerosis, and
tricompartmental marginal spurring. The femur sits somewhat
anteriorly on the tibia, raising the question of ACL insufficiency.
Trace knee effusion. Severe chondral thinning laterally in the
patellofemoral joint with marginal spurring and mild lateral
patellar tilt. The femoral trochlear groove is somewhat shallow.

No fracture is observed. Low-level prepatellar subcutaneous
infiltrative edema.

A Baker's cyst is present measuring 8.3 cm craniocaudad, and
primarily extending cephalad from the joint. Lobulated superior
portion.

No unexpected degree of regional muscular atrophy.

Left ankle: 2 mm thick axial images were obtained through the left
ankle and demonstrate no significant osteochondral lesion of the
plafond or talar dome. There is abnormal spur like irregularity
superiorly at the calcaneocuboid articulation of uncertain
significance but possibly from an old anterior process fracture of
the calcaneus. There is likely some distal tibialis posterior
tendinopathy.
IMPRESSION: 1. Severe osteoarthritis in the left knee as detailed above.
Moderate to large sized Baker's cyst. Trace knee effusion.
2. Fragmented spurring of the along the anterior process of the
calcaneus, possibly from an old anterior process fracture.
3. Mild to moderate degenerative chondral thinning in the left hip.

## 2017-12-31 ENCOUNTER — Ambulatory Visit
Admission: RE | Admit: 2017-12-31 | Discharge: 2017-12-31 | Disposition: A | Discharge status: Home / Self Care | Payer: Medicare HMO | Source: Ambulatory Visit | Attending: Family Medicine | Admitting: Family Medicine

## 2017-12-31 DIAGNOSIS — M8580 Other specified disorders of bone density and structure, unspecified site: Secondary | ICD-10-CM | POA: Diagnosis not present

## 2017-12-31 DIAGNOSIS — Z13820 Encounter for screening for osteoporosis: Secondary | ICD-10-CM | POA: Diagnosis not present

## 2017-12-31 DIAGNOSIS — M8589 Other specified disorders of bone density and structure, multiple sites: Secondary | ICD-10-CM | POA: Diagnosis not present

## 2017-12-31 DIAGNOSIS — Z780 Asymptomatic menopausal state: Secondary | ICD-10-CM | POA: Diagnosis not present

## 2018-01-04 ENCOUNTER — Telehealth: Payer: Self-pay | Admitting: Family Medicine

## 2018-01-04 NOTE — Telephone Encounter (Signed)
Not able to discuss treatment for DEXA scan needs to discuss treatment with PCP. I will forward message to PCP.

## 2018-01-04 NOTE — Telephone Encounter (Unsigned)
Copied from [place] [id number]. Topic: Quick Communication - See Telephone Encounter >> Jan 04, 2018  2:11 PM Percell Belt A wrote: CRM for notification. See Telephone encounter for: pt called in and thought that Dr Ancil Boozer was going to call in a med for her for the decrease in the DEXA scan.  She was calling to see what that treatment was going to be.  She would like a call back from the nurse to talk about treatment  Best number [phone]   01/04/18.

## 2018-01-04 NOTE — Telephone Encounter (Signed)
pt called in and thought that Dr Ancil Boozer was going to call in a med for her for the decrease in the DEXA scan.  She was calling to see what that treatment was going to be.  She would like a call back from the nurse to talk about treatment  Best number [phone]

## 2018-01-07 ENCOUNTER — Other Ambulatory Visit: Payer: Self-pay | Admitting: Family Medicine

## 2018-01-07 ENCOUNTER — Other Ambulatory Visit: Payer: Self-pay

## 2018-01-07 MED ORDER — IBANDRONATE SODIUM 150 MG PO TABS
ORAL_TABLET | ORAL | 12 refills | Status: DC
Start: 2018-01-07 — End: 2019-02-01

## 2018-01-07 NOTE — Telephone Encounter (Signed)
She was on Fosamax for years. She will continue the [facility] and will need a refill on it.

## 2018-01-07 NOTE — Telephone Encounter (Signed)
She is on Boniva now, they have other forms of medication, usually injectable and cannot be changed without seeing her. I can refer her to endo for further management. The only other option is to try Fosamax, the other form of diphosphonate.

## 2018-01-07 NOTE — Telephone Encounter (Signed)
Refill request for general medication: Boniva 150 mg  Last office visit: 11/21/2017  Last physical exam: 11/21/2017  Follow-up on file. 11/22/2018

## 2018-01-08 DIAGNOSIS — C441192 Basal cell carcinoma of skin of left lower eyelid, including canthus: Secondary | ICD-10-CM | POA: Diagnosis not present

## 2018-01-08 DIAGNOSIS — C44111 Basal cell carcinoma of skin of unspecified eyelid, including canthus: Secondary | ICD-10-CM | POA: Diagnosis not present

## 2018-01-17 ENCOUNTER — Encounter: Payer: Self-pay | Admitting: Family Medicine

## 2018-01-22 ENCOUNTER — Ambulatory Visit: Payer: Self-pay | Admitting: Family Medicine

## 2018-01-22 VITALS — BP 120/74 | HR 82 | Temp 98.0°F | Resp 18

## 2018-01-22 DIAGNOSIS — R05 Cough: Secondary | ICD-10-CM

## 2018-01-22 DIAGNOSIS — R059 Cough, unspecified: Secondary | ICD-10-CM

## 2018-01-22 DIAGNOSIS — H6993 Unspecified Eustachian tube disorder, bilateral: Secondary | ICD-10-CM

## 2018-01-22 DIAGNOSIS — J069 Acute upper respiratory infection, unspecified: Secondary | ICD-10-CM

## 2018-01-22 MED ORDER — BENZONATATE 100 MG PO CAPS: 100.0000 mg | ORAL_CAPSULE | Freq: Two times a day (BID) | ORAL | 0 refills | Status: AC | PRN

## 2018-01-22 MED ORDER — FLUTICASONE PROPIONATE 50 MCG/ACT NA SUSP: 2.0000 | Freq: Every day | NASAL | 0 refills | Status: DC

## 2018-01-22 MED ORDER — LORATADINE-PSEUDOEPHEDRINE ER 5-120 MG PO TB12: 1.0000 | ORAL_TABLET | Freq: Two times a day (BID) | ORAL | 0 refills | Status: AC | PRN

## 2018-01-22 NOTE — Progress Notes (Signed)
Subjective:     Bonnie Owens is a 67 y.o. female who presents for evaluation of symptoms of a URI. Symptoms include achiness, headache described as mild, nasal congestion, non productive cough, sinus pressure and sneezing. Onset of symptoms was 4 days ago, and has been gradually improving since that time. Treatment to date: ibuprophen. She reports that she initially had a sore throat, which has resolved and now just feels a slight scratch in her throat. Denies known fever at home but reports chills that have resolved. Patient was prescribed albuterol during a URI last year and has not used it for 6 months, including during this illness due to a lack of SOB or wheezing. Patient also requested a refill for her Flonase today. Denies rash, nausea, vomiting, or diarrhea.   Review of Systems Pertinent items noted in HPI and remainder of comprehensive ROS otherwise negative.   Objective:    BP 120/74   Pulse 82   Temp 98 F (36.7 C) (Oral)   Resp 18   SpO2 98%   General Appearance:    Alert, cooperative, no distress, appears stated age  Head:    Normocephalic, without obvious abnormality, atraumatic  Eyes:    PERRL, conjunctiva/corneas clear  Ears:    Normal TM's and external ear canals, both ears  Nose:   Nares normal, septum midline, mucosa with slight erythema and edema. Small amount of clear drainage noted. Slight facial pressure bilaterally at frontal and maxillary sinuses.   Throat:   Lips, mucosa, and tongue normal; teeth and gums normal. PND noted but no erythema, edema, or exudates.  Neck:   Supple, symmetrical, trachea midline, no adenopathy     Lungs:     Clear to auscultation bilaterally, respirations unlabored  Chest Wall:    No tenderness or deformity   Heart:    Regular rate and rhythm, S1 and S2 normal, no murmur, rub   or gallop                    Skin:   Skin color, texture, turgor normal, no rashes or lesions  Lymph nodes:   Cervical, supraclavicular nodes normal         Assessment:    viral upper respiratory illness   Plan:    Discussed diagnosis and treatment of URI. Suggested symptomatic OTC remedies. Follow up as needed. Prescribed Claritin-D and tessalon perles to take PRN for symptomatic relief. Patient has taken these before and tolerated them well. Also refilled her flonase today.

## 2018-02-21 ENCOUNTER — Other Ambulatory Visit: Payer: Self-pay | Admitting: Family Medicine

## 2018-02-21 NOTE — Telephone Encounter (Signed)
Refill Request for Cholesterol medication. Crestor to  [facility].   Last visit: 11/21/2017   Lab Results  Component Value Date   CHOL 258 (H) 11/21/2017   HDL 69 11/21/2017   LDLCALC 166 (H) 11/21/2017   TRIG 115 11/21/2017   CHOLHDL 3.7 11/21/2017    Follow up on 11/22/2018

## 2018-06-18 ENCOUNTER — Other Ambulatory Visit: Payer: Self-pay | Admitting: Family Medicine

## 2018-06-18 DIAGNOSIS — Z1231 Encounter for screening mammogram for malignant neoplasm of breast: Secondary | ICD-10-CM

## 2018-07-10 ENCOUNTER — Ambulatory Visit
Admission: RE | Admit: 2018-07-10 | Discharge: 2018-07-10 | Disposition: A | Discharge status: Home / Self Care | Payer: Medicare HMO | Source: Ambulatory Visit | Attending: Family Medicine | Admitting: Family Medicine

## 2018-07-10 DIAGNOSIS — Z1231 Encounter for screening mammogram for malignant neoplasm of breast: Secondary | ICD-10-CM | POA: Diagnosis not present

## 2018-07-24 DIAGNOSIS — H903 Sensorineural hearing loss, bilateral: Secondary | ICD-10-CM | POA: Diagnosis not present

## 2018-07-24 DIAGNOSIS — H60339 Swimmer's ear, unspecified ear: Secondary | ICD-10-CM | POA: Diagnosis not present

## 2018-08-13 ENCOUNTER — Telehealth: Payer: Self-pay | Admitting: Family Medicine

## 2018-08-13 NOTE — Telephone Encounter (Signed)
She needs to be seen, is she having chest pain with episodes, shortness or breath? Fatigue. It can be from her heart. It can also be episodes of hypoglycemia

## 2018-08-13 NOTE — Telephone Encounter (Signed)
Copied from [place] [id number]. Topic: Quick Communication - See Telephone Encounter >> Aug 13, 2018  4:03 PM Rutherford Nail, NT wrote: CRM for notification. See Telephone encounter for: 08/13/18. Patient calling and states that she has had a hysterectomy and has been through menopause, but she is having hot flashes that are "different from menopause hot flashes." States that they start at her neck and face and before they come on, she gets ver nauseated. Would like to know what Dr Ancil Boozer thinks about this? CB#: [phone]

## 2018-08-14 NOTE — Telephone Encounter (Signed)
Please schedule patient for an office visit.

## 2018-08-15 DIAGNOSIS — D1800 Hemangioma unspecified site: Secondary | ICD-10-CM | POA: Diagnosis not present

## 2018-08-15 DIAGNOSIS — L812 Freckles: Secondary | ICD-10-CM | POA: Diagnosis not present

## 2018-08-15 DIAGNOSIS — Z1283 Encounter for screening for malignant neoplasm of skin: Secondary | ICD-10-CM | POA: Diagnosis not present

## 2018-08-15 DIAGNOSIS — L821 Other seborrheic keratosis: Secondary | ICD-10-CM | POA: Diagnosis not present

## 2018-08-15 DIAGNOSIS — L578 Other skin changes due to chronic exposure to nonionizing radiation: Secondary | ICD-10-CM | POA: Diagnosis not present

## 2018-08-15 DIAGNOSIS — Z85828 Personal history of other malignant neoplasm of skin: Secondary | ICD-10-CM | POA: Diagnosis not present

## 2018-08-15 DIAGNOSIS — L700 Acne vulgaris: Secondary | ICD-10-CM | POA: Diagnosis not present

## 2018-08-16 ENCOUNTER — Encounter: Payer: Self-pay | Admitting: Family Medicine

## 2018-08-16 ENCOUNTER — Ambulatory Visit (INDEPENDENT_AMBULATORY_CARE_PROVIDER_SITE_OTHER): Payer: Medicare HMO | Admitting: Family Medicine

## 2018-08-16 VITALS — BP 130/78 | HR 85 | Temp 98.2°F | Resp 16 | Ht 62.0 in | Wt 151.6 lb

## 2018-08-16 DIAGNOSIS — R232 Flushing: Secondary | ICD-10-CM

## 2018-08-16 DIAGNOSIS — Z79899 Other long term (current) drug therapy: Secondary | ICD-10-CM | POA: Diagnosis not present

## 2018-08-16 DIAGNOSIS — Z23 Encounter for immunization: Secondary | ICD-10-CM

## 2018-08-16 DIAGNOSIS — R110 Nausea: Secondary | ICD-10-CM | POA: Diagnosis not present

## 2018-08-16 LAB — COMPLETE METABOLIC PANEL WITH GFR
AG Ratio: 1.6 (calc) (ref 1.0–2.5)
ALKALINE PHOSPHATASE (APISO): 59 U/L (ref 33–130)
ALT: 13 U/L (ref 6–29)
AST: 18 U/L (ref 10–35)
Albumin: 4.4 g/dL (ref 3.6–5.1)
BUN: 12 mg/dL (ref 7–25)
CALCIUM: 9.8 mg/dL (ref 8.6–10.4)
CO2: 27 mmol/L (ref 20–32)
Chloride: 104 mmol/L (ref 98–110)
Creat: 0.78 mg/dL (ref 0.50–0.99)
GFR, EST AFRICAN AMERICAN: 91 mL/min/{1.73_m2} (ref >=60)
GFR, EST NON AFRICAN AMERICAN: 78 mL/min/{1.73_m2} (ref >=60)
GLOBULIN: 2.8 g/dL (ref 1.9–3.7)
Glucose, Bld: 89 mg/dL (ref 65–99)
Potassium: 4.3 mmol/L (ref 3.5–5.3)
Sodium: 140 mmol/L (ref 135–146)
Total Bilirubin: 0.4 mg/dL (ref 0.2–1.2)
Total Protein: 7.2 g/dL (ref 6.1–8.1)

## 2018-08-16 LAB — CBC
HEMATOCRIT: 42.3 % (ref 35.0–45.0)
Hemoglobin: 14.3 g/dL (ref 11.7–15.5)
MCH: 31.5 pg (ref 27.0–33.0)
MCHC: 33.8 g/dL (ref 32.0–36.0)
MCV: 93.2 fL (ref 80.0–100.0)
MPV: 9.1 fL (ref 7.5–12.5)
PLATELETS: 323 10*3/uL (ref 140–400)
RBC: 4.54 10*6/uL (ref 3.80–5.10)
RDW: 12.2 % (ref 11.0–15.0)
WBC: 5 10*3/uL (ref 3.8–10.8)

## 2018-08-16 LAB — TSH: TSH: 1.48 mIU/L (ref 0.40–4.50)

## 2018-08-16 NOTE — Progress Notes (Signed)
Name: Bonnie Owens   MRN: [id number]    DOB: August 26, 1951   Date:08/16/2018       Progress Note  Subjective  Chief Complaint  Chief Complaint  Patient presents with  . Hot Flashes  . Nausea    HPI  Pt presents with concern for flushing episodes associated with significant nausea.  She used to have hot flashes when after hysterectomy, but has not had hot flashes in maybe 8 years, and these flushing episodes feel very different.  She noticed about 3-4 months ago she started having a sudden flushing that is preceeded by nausea; during these episodes she does feel anxious.  Episodes last about 3-5 minutes.  There has not been a true pattern to when they occur.  No vomiting She does take diltiazem - which may cause flushing - she has been on it for several years. No recent travel.  Patient Active Problem List   Diagnosis Date Noted  . Palpitations 03/19/2017  . History of basal cell carcinoma 11/20/2016  . Primary osteoarthritis of left knee 03/09/2016  . History of total knee replacement, left 03/09/2016  . Osteopenia 11/17/2015  . Migraine without aura and without status migrainosus, not intractable 11/17/2015  . Urge urinary incontinence 11/17/2015  . Dyslipidemia 11/17/2015  . Large breasts 11/17/2015  . Menopause 11/17/2015  . Vitamin D deficiency 11/17/2015  . MVP (mitral valve prolapse) 11/17/2015  . History of lumbar fusion 11/17/2015  . Chronic constipation 11/17/2015  . Venous insufficiency 11/17/2015  . GERD (gastroesophageal reflux disease) 11/17/2015  . Eustachian tube dysfunction 11/17/2015  . Sciatica of left side     Social History   Tobacco Use  . Smoking status: Never Smoker  . Smokeless tobacco: Never Used  Substance Use Topics  . Alcohol use: Yes    Alcohol/week: 1.0 standard drinks    Types: 1 Glasses of wine per week    Comment: weekly     Current Outpatient Medications:  .  albuterol (PROVENTIL HFA) 108 (90 Base) MCG/ACT inhaler, Inhale  into the lungs., Disp: , Rfl:  .  Ascorbic Acid (VITAMIN C PO), Take 1,000 mg by mouth daily., Disp: , Rfl:  .  aspirin 81 MG tablet, Take 81 mg by mouth daily., Disp: , Rfl:  .  Cholecalciferol (VITAMIN D-3 PO), Take 5,000 Units by mouth daily., Disp: , Rfl:  .  diltiazem (CARDIZEM CD) 120 MG 24 hr capsule, Take 1 capsule (120 mg total) by mouth daily., Disp: 30 capsule, Rfl: 12 .  fluticasone (FLONASE) 50 MCG/ACT nasal spray, Place 2 sprays into both nostrils daily., Disp: 16 g, Rfl: 0 .  ibandronate (BONIVA) 150 MG tablet, TAKE ONE TABLET BY MOUTH EVERY 30 DAYS, Disp: 1 tablet, Rfl: 12 .  ibuprofen (ADVIL,MOTRIN) 200 MG tablet, Take by mouth., Disp: , Rfl:  .  Misc Natural Products (OSTEO BI-FLEX JOINT SHIELD PO), Take 1 tablet by mouth daily., Disp: , Rfl:  .  Omega-3 Krill Oil 300 MG CAPS, Take 1 tablet by mouth daily., Disp: , Rfl:  .  polyethylene glycol powder (GLYCOLAX/MIRALAX) powder, Take 17 g by mouth daily., Disp: 850 g, Rfl: 5 .  rosuvastatin (CRESTOR) 10 MG tablet, Take 1 tablet (10 mg total) by mouth daily., Disp: 90 tablet, Rfl: 0 .  SUMAtriptan (IMITREX) 100 MG tablet, Take 1 tablet (100 mg total) by mouth as needed., Disp: 10 tablet, Rfl: 5 .  vitamin B-12 (CYANOCOBALAMIN) 1000 MCG tablet, Take by mouth., Disp: , Rfl:   No  Known Allergies  I personally reviewed active problem list, medication list, allergies, family history, social history with the patient/caregiver today.  ROS  Constitutional: Negative for fever or weight change.  Respiratory: Negative for cough and shortness of breath.   Cardiovascular: Negative for chest pain; palpitations at baseline.  Gastrointestinal: Negative for abdominal pain, no bowel changes.  Musculoskeletal: Negative for gait problem or joint swelling.  Skin: Negative for rash.  Neurological: Negative for dizziness; headaches at baseline.  No other specific complaints in a complete review of systems (except as listed in HPI  above).  Objective  Vitals:   08/16/18 0926  BP: 130/78  Pulse: 85  Resp: 16  Temp: 98.2 F (36.8 C)  TempSrc: Oral  SpO2: 99%  Weight: 151 lb 9.6 oz (68.8 kg)  Height: 5\' 2"  (1.575 m)   Body mass index is 27.73 kg/m.  Nursing Note and Vital Signs reviewed.  Physical Exam  Constitutional: Patient appears well-developed and well-nourished. No distress.  HENT: Head: Normocephalic and atraumatic. Mouth/Throat: Oropharynx is clear and moist. No oropharyngeal exudate or tonsillar swelling.  Eyes: Conjunctivae and EOM are normal. No scleral icterus.  Pupils are equal, round, and reactive to light.  Neck: Normal range of motion. Neck supple. No JVD present. No thyromegaly present.  Cardiovascular: Normal rate, regular rhythm and normal heart sounds.  No murmur heard. No BLE edema. Pulmonary/Chest: Effort normal and breath sounds normal. No respiratory distress. Abdominal: Soft. Bowel sounds are normal, no distension. There is no tenderness. No masses. Musculoskeletal: Normal range of motion, no joint effusions. No gross deformities Neurological: Pt is alert and oriented to person, place, and time. No cranial nerve deficit. Coordination, balance, strength, speech and gait are normal.  Skin: Skin is warm and dry. No rash noted. No erythema.  Psychiatric: Patient has a normal mood and affect. behavior is normal. Judgment and thought content normal.  No results found for this or any previous visit (from the past 72 hour(s)).  Assessment & Plan  1. Flushing - Discussed possible causes of flushing in detail with the patient; advised we will start with labs, and if indicated, we will proceed to imaging. - TSH - CBC - COMPLETE METABOLIC PANEL WITH GFR - Catecholamines, fractionated, urine, 24 hour; Future - Metanephrines, plasma - 5 HIAA w/Creatinine, 24 hr.  2. Nausea - COMPLETE METABOLIC PANEL WITH GFR  3. Need for influenza vaccination - Flu vaccine HIGH DOSE PF (Fluzone High  dose)  -Red flags and when to present for emergency care or RTC including fever >101.5F, chest pain, shortness of breath, new/worsening/un-resolving symptoms, syncope/near-syncope reviewed with patient at time of visit. Follow up and care instructions discussed and provided in AVS.

## 2018-08-19 DIAGNOSIS — R110 Nausea: Secondary | ICD-10-CM | POA: Diagnosis not present

## 2018-08-19 DIAGNOSIS — Z79899 Other long term (current) drug therapy: Secondary | ICD-10-CM | POA: Diagnosis not present

## 2018-08-19 DIAGNOSIS — R232 Flushing: Secondary | ICD-10-CM | POA: Diagnosis not present

## 2018-08-26 ENCOUNTER — Telehealth: Payer: Self-pay

## 2018-08-26 DIAGNOSIS — R232 Flushing: Secondary | ICD-10-CM

## 2018-08-26 NOTE — Telephone Encounter (Signed)
I am still waiting on the urine studies to come back.  Please call lab to get an estimated time and relay this to the patient.

## 2018-08-26 NOTE — Telephone Encounter (Signed)
Copied from [place] [id number]. Topic: Quick Communication - See Telephone Encounter >> Aug 26, 2018 10:51 AM Gardiner Ramus wrote: CRM for notification. See Telephone encounter for: 08/26/18. Pt had labs done 08/19/18. Pt would like a call back regarding labs. She states she has not herd anything yet. Please advise  It seems that the order was never released so it was never ran, which was confirmed by the phlebotomist.

## 2018-08-27 NOTE — Telephone Encounter (Signed)
Please call patient and have her come in to obtain new 24-hour urine collection.

## 2018-08-27 NOTE — Telephone Encounter (Signed)
Patient was informed of the reason for the delay and how we plan to remedy this conflict. I apologize for the inconvenience and asked her to come by when she got the chance. She stated that she would be by today before 5pm.  All the necessary items used for collection and the order was placed upfront for pick-up.

## 2018-08-30 ENCOUNTER — Telehealth: Payer: Self-pay | Admitting: Family Medicine

## 2018-08-30 NOTE — Telephone Encounter (Signed)
Copied from [place] [id number]. Topic: Quick Communication - See Telephone Encounter >> Aug 30, 2018  9:01 AM Bea Graff, NT wrote: CRM for notification. See Telephone encounter for: 08/30/18. Pt would like a call with her urine test result.

## 2018-08-30 NOTE — Telephone Encounter (Signed)
Please tell pt is takes several days to run these tests, please let her know we will likely have results early next week, and I will be in touch ASAP.

## 2018-09-02 NOTE — Telephone Encounter (Signed)
Patient notified

## 2018-09-03 LAB — METANEPHRINES, URINE, 24 HOUR
Metaneph Total, Ur: 289 mcg/24 h (ref 224–832)
Metanephrines, Ur: 70 mcg/24 h — ABNORMAL LOW (ref 90–315)
Normetanephrine, 24H Ur: 219 mcg/24 h (ref 122–676)
Volume, Urine-VMAUR: 1600 mL

## 2018-09-03 LAB — CATECHOLAMINES, FRACTIONATED, URINE, 24 HOUR
CREATININE, URINE MG/DAY-CATEUR: 0.78 g/(24.h) (ref 0.50–2.15)
Calculated Total (E+NE): 42 mcg/24 h (ref 26–121)
DOPAMINE, 24 HR URINE: 185 ug/(24.h) (ref 52–480)
Norepinephrine, 24 hr Ur: 42 mcg/24 h (ref 15–100)
VOLUME, URINE-VMAUR: 1600 mL

## 2018-09-03 LAB — TEST AUTHORIZATION: TEST NAME:: 5

## 2018-09-03 LAB — 5 HIAA W/CREATININE, 24 HR
5-HIAA: 3.7 mg/24 h (ref <=6.0)
Creatinine: 0.78 g/(24.h) (ref 0.50–2.15)
Volume, Urine-VMAUR: 1600 mL

## 2018-09-04 ENCOUNTER — Other Ambulatory Visit: Payer: Self-pay | Admitting: Family Medicine

## 2018-09-04 DIAGNOSIS — R232 Flushing: Secondary | ICD-10-CM | POA: Insufficient documentation

## 2018-09-04 MED ORDER — VENLAFAXINE HCL ER 37.5 MG PO CP24: 37.5000 mg | ORAL_CAPSULE | Freq: Every day | ORAL | 1 refills | Status: DC

## 2018-09-04 NOTE — Progress Notes (Unsigned)
Spoke with patient regarding her results and medication options.  We will start Effexor 37.5mg  and follow up in 4 weeks.

## 2018-10-03 ENCOUNTER — Ambulatory Visit: Payer: Medicare HMO | Admitting: Family Medicine

## 2018-10-03 ENCOUNTER — Encounter: Payer: Self-pay | Admitting: Family Medicine

## 2018-10-03 VITALS — BP 116/70 | HR 99 | Temp 97.5°F | Resp 16 | Ht 62.0 in | Wt 149.3 lb

## 2018-10-03 DIAGNOSIS — R232 Flushing: Secondary | ICD-10-CM

## 2018-10-03 DIAGNOSIS — E785 Hyperlipidemia, unspecified: Secondary | ICD-10-CM | POA: Diagnosis not present

## 2018-10-03 DIAGNOSIS — R69 Illness, unspecified: Secondary | ICD-10-CM | POA: Diagnosis not present

## 2018-10-03 DIAGNOSIS — F4321 Adjustment disorder with depressed mood: Secondary | ICD-10-CM

## 2018-10-03 DIAGNOSIS — R1013 Epigastric pain: Secondary | ICD-10-CM

## 2018-10-03 DIAGNOSIS — K5909 Other constipation: Secondary | ICD-10-CM | POA: Diagnosis not present

## 2018-10-03 DIAGNOSIS — I341 Nonrheumatic mitral (valve) prolapse: Secondary | ICD-10-CM

## 2018-10-03 DIAGNOSIS — R1310 Dysphagia, unspecified: Secondary | ICD-10-CM

## 2018-10-03 DIAGNOSIS — R110 Nausea: Secondary | ICD-10-CM | POA: Diagnosis not present

## 2018-10-03 DIAGNOSIS — K219 Gastro-esophageal reflux disease without esophagitis: Secondary | ICD-10-CM

## 2018-10-03 DIAGNOSIS — R1319 Other dysphagia: Secondary | ICD-10-CM

## 2018-10-03 MED ORDER — VENLAFAXINE HCL ER 75 MG PO CP24: 75.0000 mg | ORAL_CAPSULE | Freq: Every day | ORAL | 0 refills | Status: DC

## 2018-10-03 NOTE — Progress Notes (Signed)
Name: Bonnie Owens   MRN: [id number]    DOB: 16-Oct-1951   Date:10/03/2018       Progress Note  Subjective  Chief Complaint  Chief Complaint  Patient presents with  . Follow-up    1 mth f/u  . Flushing    patient stated that her sx persists and she cannot really see a difference with the medication    HPI  Flushing: she was seen by Raelyn Ensign, NP with severe flushing that started a few months prior. She was concerned because she had a long history of hot flashes but it has been more intense and she noticed nausea prior to flushing , during the episode of flushing, diaphoresis and makes her feel very anxious. She denies associated chest pain, feels like she is suffocating. It has happened during the night and she needs to sit up in her bed. She has been feeling more tired than usual, but no decrease in exercise tolerance. Labs was done and normal, she has been on Effexor 37.5 mg and noticed improvement of the anxiety feeling during episode but not a significant improvement of symptoms. Episodes are happening a couple times a day, they are not all very severe.   Epigastric pain: she noticed epigastric pain, dysphagia with every meal, also getting choked, getting up at night with entire chest burning. No weight loss or change in bowel. Appetite is normal. Feels bloated and has indigestion    Patient Active Problem List   Diagnosis Date Noted  . Flushing 09/04/2018  . Palpitations 03/19/2017  . History of basal cell carcinoma 11/20/2016  . Primary osteoarthritis of left knee 03/09/2016  . History of total knee replacement, left 03/09/2016  . Osteopenia 11/17/2015  . Migraine without aura and without status migrainosus, not intractable 11/17/2015  . Urge urinary incontinence 11/17/2015  . Dyslipidemia 11/17/2015  . Large breasts 11/17/2015  . Menopause 11/17/2015  . Vitamin D deficiency 11/17/2015  . MVP (mitral valve prolapse) 11/17/2015  . History of lumbar fusion 11/17/2015   . Chronic constipation 11/17/2015  . Venous insufficiency 11/17/2015  . GERD (gastroesophageal reflux disease) 11/17/2015  . Eustachian tube dysfunction 11/17/2015  . Sciatica of left side     Past Surgical History:  Procedure Laterality Date  . ABDOMINAL HYSTERECTOMY    . APPENDECTOMY    . BASAL CELL CARCINOMA EXCISION Left 10/2016   Underneath Left Eye- [place] [facility]- Dr. Nehemiah Massed  . COLONOSCOPY N/A 11/14/2017   Procedure: COLONOSCOPY;  Surgeon: Toledo, Benay Pike, MD;  Location: [facility] ENDOSCOPY;  Service: Endoscopy;  Laterality: N/A;  . JOINT REPLACEMENT     (LT) TKR 2017  . lumbar descectomy    . sling procedure    . TOTAL KNEE ARTHROPLASTY Left 03/09/2016   Procedure: TOTAL KNEE ARTHROPLASTY;  Surgeon: Hessie Knows, MD;  Location: [facility] ORS;  Service: Orthopedics;  Laterality: Left;  . TUBAL LIGATION    . tummy tuck      Family History  Problem Relation Age of Onset  . Breast cancer Sister 4  . Cancer Sister        breast  . Berenice Primas' disease Sister   . Goiter Sister   . Dementia Mother   . Cancer Father   . Thyroid disease Brother   . Breast cancer Maternal Grandmother   . Dysfunctional uterine bleeding Daughter     Social History   Socioeconomic History  . Marital status: Married    Spouse name: Coralyn Mark   . Number of children: 2  .  Years of education: Not on file  . Highest education level: 12th grade  Occupational History  . Occupation: Estate agent. trainer    Comment: banking [employer]   Social Needs  . Financial resource strain: Not hard at all  . Food insecurity:    Worry: Never true    Inability: Never true  . Transportation needs:    Medical: No    Non-medical: No  Tobacco Use  . Smoking status: Never Smoker  . Smokeless tobacco: Never Used  Substance and Sexual Activity  . Alcohol use: Yes    Alcohol/week: 1.0 standard drinks    Types: 1 Glasses of wine per week    Comment: weekly  . Drug use: No  . Sexual activity: Yes    Partners: Male     Birth control/protection: Surgical, Post-menopausal  Lifestyle  . Physical activity:    Days per week: 1 day    Minutes per session: 30 min  . Stress: Very much  Relationships  . Social connections:    Talks on phone: More than three times a week    Gets together: Twice a week    Attends religious service: More than 4 times per year    Active member of club or organization: No    Attends meetings of clubs or organizations: Never    Relationship status: Married  . Intimate partner violence:    Fear of current or ex partner: No    Emotionally abused: No    Physically abused: No    Forced sexual activity: No  Other Topics Concern  . Not on file  Social History Narrative   Husband is a Engineer, agricultural    She has 3 grandson.      Current Outpatient Medications:  .  albuterol (PROVENTIL HFA) 108 (90 Base) MCG/ACT inhaler, Inhale into the lungs., Disp: , Rfl:  .  Ascorbic Acid (VITAMIN C PO), Take 1,000 mg by mouth daily., Disp: , Rfl:  .  aspirin 81 MG tablet, Take 81 mg by mouth daily., Disp: , Rfl:  .  Cholecalciferol (VITAMIN D-3 PO), Take 5,000 Units by mouth daily., Disp: , Rfl:  .  diltiazem (CARDIZEM CD) 120 MG 24 hr capsule, Take 1 capsule (120 mg total) by mouth daily., Disp: 30 capsule, Rfl: 12 .  fluticasone (FLONASE) 50 MCG/ACT nasal spray, Place 2 sprays into both nostrils daily., Disp: 16 g, Rfl: 0 .  ibandronate (BONIVA) 150 MG tablet, TAKE ONE TABLET BY MOUTH EVERY 30 DAYS, Disp: 1 tablet, Rfl: 12 .  ibuprofen (ADVIL,MOTRIN) 200 MG tablet, Take by mouth., Disp: , Rfl:  .  Misc Natural Products (OSTEO BI-FLEX JOINT SHIELD PO), Take 1 tablet by mouth daily., Disp: , Rfl:  .  Omega-3 Krill Oil 300 MG CAPS, Take 1 tablet by mouth daily., Disp: , Rfl:  .  polyethylene glycol powder (GLYCOLAX/MIRALAX) powder, Take 17 g by mouth daily., Disp: 850 g, Rfl: 5 .  SUMAtriptan (IMITREX) 100 MG tablet, Take 1 tablet (100 mg total) by mouth as needed., Disp: 10 tablet, Rfl: 5 .   venlafaxine XR (EFFEXOR XR) 37.5 MG 24 hr capsule, Take 1 capsule (37.5 mg total) by mouth daily with breakfast., Disp: 30 capsule, Rfl: 1 .  vitamin B-12 (CYANOCOBALAMIN) 1000 MCG tablet, Take by mouth., Disp: , Rfl:   No Known Allergies  I personally reviewed active problem list, medication list, allergies, family history, social history with the patient/caregiver today.   ROS  Constitutional: Negative for fever or weight change.  Respiratory: Negative for cough and shortness of breath.   Cardiovascular: Negative for chest pain or palpitations.  Gastrointestinal: positive  for abdominal pain, but no  bowel changes.  Musculoskeletal: Negative for gait problem or joint swelling.  Skin: Negative for rash.  Neurological: Negative for dizziness or headache.  No other specific complaints in a complete review of systems (except as listed in HPI above).  Objective  Vitals:   10/03/18 1005  BP: 116/70  Pulse: 99  Resp: 16  Temp: (!) 97.5 F (36.4 C)  TempSrc: Oral  SpO2: 97%  Weight: 149 lb 4.8 oz (67.7 kg)  Height: 5\' 2"  (1.575 m)    Body mass index is 27.31 kg/m.  Physical Exam  Constitutional: Patient appears well-developed and well-nourished. Overweight.  No distress.  HEENT: head atraumatic, normocephalic, pupils equal and reactive to light,  neck supple, throat within normal limits Cardiovascular: Normal rate, regular rhythm and normal heart sounds.  No murmur heard. No BLE edema. Pulmonary/Chest: Effort normal and breath sounds normal. No respiratory distress. Abdominal: Soft.  There is no tenderness. Psychiatric: Patient has a normal mood and affect. behavior is normal. Judgment and thought content normal.  Recent Results (from the past 2160 hour(s))  TSH     Status: None   Collection Time: 08/16/18 10:17 AM  Result Value Ref Range   TSH 1.48 0.40 - 4.50 mIU/L  CBC     Status: None   Collection Time: 08/16/18 10:17 AM  Result Value Ref Range   WBC 5.0 3.8 - 10.8  Thousand/uL   RBC 4.54 3.80 - 5.10 Million/uL   Hemoglobin 14.3 11.7 - 15.5 g/dL   HCT 42.3 35.0 - 45.0 %   MCV 93.2 80.0 - 100.0 fL   MCH 31.5 27.0 - 33.0 pg   MCHC 33.8 32.0 - 36.0 g/dL   RDW 12.2 11.0 - 15.0 %   Platelets 323 140 - 400 Thousand/uL   MPV 9.1 7.5 - 12.5 fL  COMPLETE METABOLIC PANEL WITH GFR     Status: None   Collection Time: 08/16/18 10:17 AM  Result Value Ref Range   Glucose, Bld 89 65 - 99 mg/dL    Comment: .            Fasting reference interval .    BUN 12 7 - 25 mg/dL   Creat 0.78 0.50 - 0.99 mg/dL    Comment: For patients >36 years of age, the reference limit for Creatinine is approximately 13% higher for people identified as African-American. .    GFR, Est Non African American 78 > OR = 60 mL/min/1.6m2   GFR, Est African American 91 > OR = 60 mL/min/1.15m2   BUN/Creatinine Ratio NOT APPLICABLE 6 - 22 (calc)   Sodium 140 135 - 146 mmol/L   Potassium 4.3 3.5 - 5.3 mmol/L   Chloride 104 98 - 110 mmol/L   CO2 27 20 - 32 mmol/L   Calcium 9.8 8.6 - 10.4 mg/dL   Total Protein 7.2 6.1 - 8.1 g/dL   Albumin 4.4 3.6 - 5.1 g/dL   Globulin 2.8 1.9 - 3.7 g/dL (calc)   AG Ratio 1.6 1.0 - 2.5 (calc)   Total Bilirubin 0.4 0.2 - 1.2 mg/dL   Alkaline phosphatase (APISO) 59 33 - 130 U/L   AST 18 10 - 35 U/L   ALT 13 6 - 29 U/L  Metanephrines, urine, 24 hour     Status: Abnormal   Collection Time: 08/19/18  2:16 PM  Result  Value Ref Range   Volume, Urine-VMAUR 1,600 mL   Metanephrines, Ur 70 (L) 90 - 315 mcg/24 h    Comment: . This test was developed and its analytical performance characteristics have been determined by [place], [place]. It has not been cleared or approved by the U.S. Food and Drug Administration. This assay has been validated pursuant to the CLIA regulations and is used for clinical purposes. .    Normetanephrine, 24H Ur 219 122 - 676 mcg/24 h    Comment: . This test was developed and its analytical  performance characteristics have been determined by [place], [place]. It has not been cleared or approved by the U.S. Food and Drug Administration. This assay has been validated pursuant to the CLIA regulations and is used for clinical purposes. Cathey Endow Total, Ur 289 224 - 832 mcg/24 h    Comment: . A four fold elevation of urinary normetanephrines is extremely likely to be due to a tumor, while a four fold elevation of urinary metanephrines is highly suggestive, but not diagnostic of the tumor. Measurement of plasma Metanephrines and Chromogranin A is recommended for confirmation. .   5 HIAA w/Creatinine, 24 hr.     Status: None   Collection Time: 08/19/18  2:16 PM  Result Value Ref Range   Volume, Urine-VMAUR 1,600 mL   5-HIAA 3.7 <=6.0 mg/24 h    Comment: . This specimen was submitted with a pH greater than 5.0.  Optimum pH for this assay is 1.0-5.0. Improper preservation may compromise the validity of the assay. . . This test was developed and its analytical performance characteristics have been determined by [place], [place]. It has not been cleared or approved by the U.S. Food and Drug Administration. This assay has been validated pursuant to the CLIA regulations and is used for clinical purposes. .    Creatinine 0.78 0.50 - 2.15 g/24 h  Catecholamines, fractionated, urine, 24 hour     Status: None   Collection Time: 08/19/18  2:16 PM  Result Value Ref Range   Volume, Urine-VMAUR 1,600 mL   Epinephrine, 24 hr Urine see note     Comment: Results are below the reportable range for this analyte, which is 2.0 mcg/L. Marland Kitchen This test was developed and its analytical performance characteristics have been determined by [employer], [place]. It has not been cleared or approved by the U.S. Food and Drug Administration. This assay has been validated pursuant to the CLIA  regulations and is used for clinical purposes. .    Norepinephrine, 24 hr Ur 42 15 - 100 mcg/24 h    Comment: . This test was developed and its analytical performance characteristics have been determined by [place], [place]. It has not been cleared or approved by the U.S. Food and Drug Administration. This assay has been validated pursuant to the CLIA regulations and is used for clinical purposes. .    Calculated Total (E+NE) 42 26 - 121 mcg/24 h    Comment: . This test was developed and its analytical performance characteristics have been determined by [place], [place]. It has not been cleared or approved by the U.S. Food and Drug Administration. This assay has been validated pursuant to the CLIA regulations and is used for clinical purposes. .    Dopamine, 24 hr Urine 185 52 - 480 mcg/24 h    Comment: .  This specimen was submitted with a pH greater than 5.0.  Optimum pH for this assay is 1.0-5.0. Improper preservation may compromise the validity of the assay. . . This test was developed and its analytical performance characteristics have been determined by [place], [place]. It has not been cleared or approved by the U.S. Food and Drug Administration. This assay has been validated pursuant to the CLIA regulations and is used for clinical purposes. .    Creatinine, Urine mg/day-CATEUR 0.78 0.50 - 2.15 g/24 h  TEST AUTHORIZATION     Status: None   Collection Time: 08/19/18  2:16 PM  Result Value Ref Range   TEST NAME: 5 HIAA, 24 HOUR URINE, CATECH    TEST CODE: [id number]    CLIENT CONTACT: TALETHIA TATUM    REPORT ALWAYS MESSAGE SIGNATURE      Comment: . The laboratory testing on this patient was verbally requested or confirmed by the ordering physician or his or her authorized representative after contact with an employee of [employer]. Federal  regulations require that we maintain on file written authorization for all laboratory testing.  Accordingly we are asking that the ordering physician or his or her authorized representative sign a copy of this report and promptly return it to the client service representative. . . Signature:____________________________________________________ . Please fax this signed page to [phone] or return it via your [employer] courier.    PHQ2/9: Depression screen [facility] 2/9 10/03/2018 08/16/2018 11/21/2017 11/20/2016 11/17/2015  Decreased Interest 0 0 0 0 0  Down, Depressed, Hopeless 1 0 0 0 0  PHQ - 2 Score 1 0 0 0 0  Altered sleeping 3 0 - - -  Tired, decreased energy 3 1 - - -  Change in appetite 0 0 - - -  Feeling bad or failure about yourself  0 0 - - -  Trouble concentrating 0 0 - - -  Moving slowly or fidgety/restless 0 0 - - -  Suicidal thoughts 0 0 - - -  PHQ-9 Score 7 1 - - -  Difficult doing work/chores Not difficult at all Not difficult at all - - -    GAD 7 : Generalized Anxiety Score 10/03/2018  Nervous, Anxious, on Edge 3  Control/stop worrying 0  Worry too much - different things 0  Trouble relaxing 1  Restless 0  Easily annoyed or irritable 0  Afraid - awful might happen 0  Total GAD 7 Score 4  Anxiety Difficulty Not difficult at all    Fall Risk: Fall Risk  10/03/2018 08/16/2018 11/21/2017 11/20/2016 11/17/2015  Falls in the past year? No No No No No     Functional Status Survey: Is the patient deaf or have difficulty hearing?: No Does the patient have difficulty seeing, even when wearing glasses/contacts?: No Does the patient have difficulty concentrating, remembering, or making decisions?: No Does the patient have difficulty walking or climbing stairs?: No Does the patient have difficulty dressing or bathing?: No Does the patient have difficulty doing errands alone such as visiting a doctor's office or shopping?: No    Assessment & Plan  1.  Flushing  - Ambulatory referral to Cardiology - CRP High sensitivity - venlafaxine XR (EFFEXOR-XR) 75 MG 24 hr capsule; Take 1 capsule (75 mg total) by mouth daily with breakfast.  Dispense: 90 capsule; Refill: 0  2. Epigastric pain  - Ambulatory referral to Gastroenterology  3. MVP (mitral valve prolapse)  - Ambulatory referral to Cardiology  4. Nausea   5. Chronic constipation   6. Dyslipidemia  - Ambulatory referral to Cardiology - Lipid panel - CRP High sensitivity  7. Gastroesophageal reflux disease without esophagitis  - Ambulatory referral to Gastroenterology  8. Esophageal dysphagia  - Ambulatory referral to Gastroenterology - H. pylori breath test  9. Dyspepsia  - H. pylori breath test  10. Situational depression  She worries about her husband, he is a Engineer, agricultural and she worries about his health also.  I will increase Effexor 75 mg daily

## 2018-10-04 LAB — LIPID PANEL
CHOL/HDL RATIO: 4.3 (calc) (ref <5.0)
Cholesterol: 251 mg/dL — ABNORMAL HIGH (ref <200)
HDL: 59 mg/dL (ref >50)
LDL Cholesterol (Calc): 167 mg/dL (calc) — ABNORMAL HIGH
Non-HDL Cholesterol (Calc): 192 mg/dL (calc) — ABNORMAL HIGH (ref <130)
Triglycerides: 120 mg/dL (ref <150)

## 2018-10-04 LAB — HIGH SENSITIVITY CRP: HS-CRP: 0.9 mg/L

## 2018-10-04 LAB — H. PYLORI BREATH TEST: H. PYLORI BREATH TEST: NOT DETECTED

## 2018-10-07 ENCOUNTER — Other Ambulatory Visit: Payer: Self-pay | Admitting: Gastroenterology

## 2018-10-07 DIAGNOSIS — R1013 Epigastric pain: Secondary | ICD-10-CM | POA: Diagnosis not present

## 2018-10-07 DIAGNOSIS — R1310 Dysphagia, unspecified: Secondary | ICD-10-CM

## 2018-10-11 ENCOUNTER — Ambulatory Visit
Admission: RE | Admit: 2018-10-11 | Discharge: 2018-10-11 | Disposition: A | Discharge status: Home / Self Care | Payer: Medicare HMO | Source: Ambulatory Visit | Attending: Gastroenterology | Admitting: Gastroenterology

## 2018-10-11 DIAGNOSIS — K449 Diaphragmatic hernia without obstruction or gangrene: Secondary | ICD-10-CM | POA: Insufficient documentation

## 2018-10-11 DIAGNOSIS — R1310 Dysphagia, unspecified: Secondary | ICD-10-CM | POA: Insufficient documentation

## 2018-10-11 DIAGNOSIS — R1013 Epigastric pain: Secondary | ICD-10-CM | POA: Insufficient documentation

## 2018-10-25 DIAGNOSIS — R232 Flushing: Secondary | ICD-10-CM | POA: Diagnosis not present

## 2018-10-25 DIAGNOSIS — R0789 Other chest pain: Secondary | ICD-10-CM | POA: Diagnosis not present

## 2018-10-25 DIAGNOSIS — R002 Palpitations: Secondary | ICD-10-CM | POA: Diagnosis not present

## 2018-10-25 DIAGNOSIS — R42 Dizziness and giddiness: Secondary | ICD-10-CM | POA: Diagnosis not present

## 2018-10-25 DIAGNOSIS — R0602 Shortness of breath: Secondary | ICD-10-CM | POA: Diagnosis not present

## 2018-11-12 DIAGNOSIS — R0602 Shortness of breath: Secondary | ICD-10-CM | POA: Diagnosis not present

## 2018-11-12 DIAGNOSIS — R0789 Other chest pain: Secondary | ICD-10-CM | POA: Diagnosis not present

## 2018-11-18 DIAGNOSIS — R002 Palpitations: Secondary | ICD-10-CM | POA: Diagnosis not present

## 2018-11-18 DIAGNOSIS — R0789 Other chest pain: Secondary | ICD-10-CM | POA: Diagnosis not present

## 2018-11-18 DIAGNOSIS — R0602 Shortness of breath: Secondary | ICD-10-CM | POA: Diagnosis not present

## 2018-11-19 ENCOUNTER — Encounter: Payer: Self-pay | Admitting: *Deleted

## 2018-11-20 ENCOUNTER — Ambulatory Visit: Payer: Medicare HMO | Admitting: Anesthesiology

## 2018-11-20 ENCOUNTER — Encounter: Payer: Self-pay | Admitting: *Deleted

## 2018-11-20 ENCOUNTER — Ambulatory Visit
Admission: RE | Admit: 2018-11-20 | Discharge: 2018-11-20 | Disposition: A | Discharge status: Home / Self Care | Payer: Medicare HMO | Attending: Internal Medicine | Admitting: Internal Medicine

## 2018-11-20 ENCOUNTER — Encounter
Admission: RE | Disposition: A | Discharge status: Home / Self Care | Payer: Self-pay | Source: Home / Self Care | Attending: Internal Medicine

## 2018-11-20 DIAGNOSIS — K5909 Other constipation: Secondary | ICD-10-CM | POA: Diagnosis not present

## 2018-11-20 DIAGNOSIS — Z791 Long term (current) use of non-steroidal anti-inflammatories (NSAID): Secondary | ICD-10-CM | POA: Diagnosis not present

## 2018-11-20 DIAGNOSIS — Z79899 Other long term (current) drug therapy: Secondary | ICD-10-CM | POA: Diagnosis not present

## 2018-11-20 DIAGNOSIS — I872 Venous insufficiency (chronic) (peripheral): Secondary | ICD-10-CM | POA: Diagnosis not present

## 2018-11-20 DIAGNOSIS — I341 Nonrheumatic mitral (valve) prolapse: Secondary | ICD-10-CM | POA: Diagnosis not present

## 2018-11-20 DIAGNOSIS — K259 Gastric ulcer, unspecified as acute or chronic, without hemorrhage or perforation: Secondary | ICD-10-CM | POA: Diagnosis not present

## 2018-11-20 DIAGNOSIS — E559 Vitamin D deficiency, unspecified: Secondary | ICD-10-CM | POA: Insufficient documentation

## 2018-11-20 DIAGNOSIS — K449 Diaphragmatic hernia without obstruction or gangrene: Secondary | ICD-10-CM | POA: Insufficient documentation

## 2018-11-20 DIAGNOSIS — M1990 Unspecified osteoarthritis, unspecified site: Secondary | ICD-10-CM | POA: Diagnosis not present

## 2018-11-20 DIAGNOSIS — K222 Esophageal obstruction: Secondary | ICD-10-CM | POA: Diagnosis not present

## 2018-11-20 DIAGNOSIS — K219 Gastro-esophageal reflux disease without esophagitis: Secondary | ICD-10-CM | POA: Insufficient documentation

## 2018-11-20 DIAGNOSIS — M8580 Other specified disorders of bone density and structure, unspecified site: Secondary | ICD-10-CM | POA: Diagnosis not present

## 2018-11-20 DIAGNOSIS — Z7982 Long term (current) use of aspirin: Secondary | ICD-10-CM | POA: Insufficient documentation

## 2018-11-20 DIAGNOSIS — R1013 Epigastric pain: Secondary | ICD-10-CM | POA: Diagnosis not present

## 2018-11-20 DIAGNOSIS — R0789 Other chest pain: Secondary | ICD-10-CM | POA: Diagnosis not present

## 2018-11-20 DIAGNOSIS — E785 Hyperlipidemia, unspecified: Secondary | ICD-10-CM | POA: Diagnosis not present

## 2018-11-20 DIAGNOSIS — R1310 Dysphagia, unspecified: Secondary | ICD-10-CM | POA: Diagnosis not present

## 2018-11-20 DIAGNOSIS — Z7951 Long term (current) use of inhaled steroids: Secondary | ICD-10-CM | POA: Diagnosis not present

## 2018-11-20 DIAGNOSIS — K2970 Gastritis, unspecified, without bleeding: Secondary | ICD-10-CM | POA: Diagnosis not present

## 2018-11-20 HISTORY — DX: Dyspnea, unspecified: R06.00

## 2018-11-20 HISTORY — PX: ESOPHAGOGASTRODUODENOSCOPY (EGD) WITH PROPOFOL: SHX5813

## 2018-11-20 HISTORY — DX: Angina pectoris, unspecified: I20.9

## 2018-11-20 SURGERY — ESOPHAGOGASTRODUODENOSCOPY (EGD) WITH PROPOFOL
Anesthesia: General

## 2018-11-20 MED ORDER — PROPOFOL 10 MG/ML IV BOLUS
INTRAVENOUS | Status: AC
  Filled 2018-11-20: qty 20

## 2018-11-20 MED ORDER — SODIUM CHLORIDE 0.9 % IV SOLN
INTRAVENOUS | Status: DC
  Administered 2018-11-20: 14:00:00 via INTRAVENOUS

## 2018-11-20 MED ORDER — PROPOFOL 10 MG/ML IV BOLUS
INTRAVENOUS | Status: DC | PRN
  Administered 2018-11-20: 20 mg via INTRAVENOUS
  Administered 2018-11-20: 10 mg via INTRAVENOUS
  Administered 2018-11-20: 30 mg via INTRAVENOUS
  Administered 2018-11-20: 50 mg via INTRAVENOUS

## 2018-11-20 MED ORDER — LIDOCAINE HCL (CARDIAC) PF 100 MG/5ML IV SOSY
PREFILLED_SYRINGE | INTRAVENOUS | Status: DC | PRN
  Administered 2018-11-20: 50 mg via INTRAVENOUS

## 2018-11-20 NOTE — Interval H&P Note (Signed)
History and Physical Interval Note:  11/20/2018 1:10 PM  Bonnie Owens  has presented today for surgery, with the diagnosis of Dysphagia, epigastic pain & dyspepsia.  The various methods of treatment have been discussed with the patient and family. After consideration of risks, benefits and other options for treatment, the patient has consented to  Procedure(s): ESOPHAGOGASTRODUODENOSCOPY (EGD) WITH PROPOFOL (N/A) as a surgical intervention .  The patient's history has been reviewed, patient examined, no change in status, stable for surgery.  I have reviewed the patient's chart and labs.  Questions were answered to the patient's satisfaction.     [place], [place]

## 2018-11-20 NOTE — Op Note (Signed)
[facility] Gastroenterology Patient Name: Bonnie Owens Procedure Date: 11/20/2018 1:43 PM MRN: [id number] Account #: [id number] Date of Birth: 07-30-1951 Admit Type: Outpatient Age: 67 Room: [facility] ENDO ROOM 3 Gender: Female Note Status: Finalized Procedure:            Upper GI endoscopy Indications:          Dysphagia Providers:            Benay Pike. [name] MD, MD Referring MD:         Bethena Roys. Sowles, MD (Referring MD) Medicines:            Propofol per Anesthesia Complications:        No immediate complications. Procedure:            Pre-Anesthesia Assessment:                       - The risks and benefits of the procedure and the                        sedation options and risks were discussed with the                        patient. All questions were answered and informed                        consent was obtained.                       - Patient identification and proposed procedure were                        verified prior to the procedure by the nurse. The                        procedure was verified in the procedure room.                       - ASA Grade Assessment: II - A patient with mild                        systemic disease.                       - After reviewing the risks and benefits, the patient                        was deemed in satisfactory condition to undergo the                        procedure.                       After obtaining informed consent, the endoscope was                        passed under direct vision. Throughout the procedure,                        the patient's blood pressure, pulse, and oxygen  saturations were monitored continuously. The Endoscope                        was introduced through the mouth, and advanced to the                        third part of duodenum. The upper GI endoscopy was                        accomplished without difficulty. The patient tolerated                         the procedure well. Findings:      Non-occlusive esophageal "B" ring, The scope was withdrawn. Dilation was       performed with a Maloney dilator with mild resistance at 73 Fr.      Localized moderate inflammation characterized by congestion (edema),       erosions and erythema was found in the gastric antrum.      A 2 cm hiatal hernia was present.      The examined duodenum was normal.      The exam was otherwise without abnormality. Impression:           - Gastritis.                       - 2 cm hiatal hernia.                       - Normal examined duodenum.                       - The examination was otherwise normal.                       - No specimens collected. Recommendation:       - Patient has a contact number available for                        emergencies. The signs and symptoms of potential                        delayed complications were discussed with the patient.                        Return to normal activities tomorrow. Written discharge                        instructions were provided to the patient.                       - Resume previous diet.                       - Continue present medications.                       - Repeat upper endoscopy PRN for retreatment.                       - Return to physician assistant in 8 weeks.                       -  The findings and recommendations were discussed with                        the patient and their spouse. Procedure Code(s):    --- Professional ---                       [phone], Esophagogastroduodenoscopy, flexible, transoral;                        diagnostic, including collection of specimen(s) by                        brushing or washing, when performed (separate procedure)                       43450, Dilation of esophagus, by unguided sound or                        bougie, single or multiple passes Diagnosis Code(s):    --- Professional ---                       R13.10, Dysphagia,  unspecified                       K44.9, Diaphragmatic hernia without obstruction or                        gangrene                       K29.70, Gastritis, unspecified, without bleeding CPT copyright 2018 American Medical Association. All rights reserved. The codes documented in this report are preliminary and upon coder review may  be revised to meet current compliance requirements. Efrain Sella MD, MD 11/20/2018 2:08:03 PM This report has been signed electronically. Number of Addenda: 0 Note Initiated On: 11/20/2018 1:43 PM      [facility]

## 2018-11-20 NOTE — Anesthesia Postprocedure Evaluation (Signed)
Anesthesia Post Note  Patient: Bonnie Owens  Procedure(s) Performed: ESOPHAGOGASTRODUODENOSCOPY (EGD) WITH PROPOFOL (N/A )  Patient location during evaluation: Endoscopy Anesthesia Type: General Level of consciousness: awake and alert Pain management: pain level controlled Vital Signs Assessment: post-procedure vital signs reviewed and stable Respiratory status: spontaneous breathing, nonlabored ventilation, respiratory function stable and patient connected to nasal cannula oxygen Cardiovascular status: blood pressure returned to baseline and stable Postop Assessment: no apparent nausea or vomiting Anesthetic complications: no     Last Vitals:  Vitals:   11/20/18 1420 11/20/18 1430  BP: (!) 140/92 (!) 150/100  Pulse: 76 68  Resp: 14 14  Temp:    SpO2: 96% 99%    Last Pain:  Vitals:   11/20/18 1430  TempSrc:   PainSc: 0-No pain                 Inez Rosato S

## 2018-11-20 NOTE — Transfer of Care (Signed)
Immediate Anesthesia Transfer of Care Note  Patient: Bonnie Owens  Procedure(s) Performed: ESOPHAGOGASTRODUODENOSCOPY (EGD) WITH PROPOFOL (N/A )  Patient Location: PACU  Anesthesia Type:General  Level of Consciousness: drowsy  Airway & Oxygen Therapy: Patient Spontanous Breathing and Patient connected to nasal cannula oxygen  Post-op Assessment: Report given to RN and Post -op Vital signs reviewed and stable  Post vital signs: Reviewed and stable  Last Vitals:  Vitals Value Taken Time  BP 133/83 11/20/2018  2:10 PM  Temp    Pulse 76 11/20/2018  2:11 PM  Resp 17 11/20/2018  2:11 PM  SpO2 96 % 11/20/2018  2:11 PM  Vitals shown include unvalidated device data.  Last Pain:  Vitals:   11/20/18 1238  TempSrc: Tympanic      Patients Stated Pain Goal: 0 (81/84/03 7543)  Complications: No apparent anesthesia complications

## 2018-11-20 NOTE — Anesthesia Post-op Follow-up Note (Signed)
Anesthesia QCDR form completed.        

## 2018-11-20 NOTE — H&P (Signed)
Outpatient short stay form Pre-procedure 11/20/2018 1:08 PM Bonnie Owens K. [name], M.D.  Primary Physician: Steele Sizer, M.D.  Reason for visit:  Dysphagia, atypical chest pain.  History of present illness:  67 y/o patient with intermittent solid food dysphagia. Atypical chest pain s/p neg cardiac stress test.     Current Facility-Administered Medications:  .  0.9 %  sodium chloride infusion, , Intravenous, Continuous, Fatim Vanderschaaf, Benay Pike, MD  Medications Prior to Admission  Medication Sig Dispense Refill Last Dose  . albuterol (PROVENTIL HFA) 108 (90 Base) MCG/ACT inhaler Inhale into the lungs.   Past Week at Unknown time  . Ascorbic Acid (VITAMIN C PO) Take 1,000 mg by mouth daily.   Past Week at Unknown time  . aspirin 81 MG tablet Take 81 mg by mouth daily.   11/19/2018 at Unknown time  . diltiazem (CARDIZEM CD) 120 MG 24 hr capsule Take 1 capsule (120 mg total) by mouth daily. 30 capsule 12 11/19/2018 at Unknown time  . fluticasone (FLONASE) 50 MCG/ACT nasal spray Place 2 sprays into both nostrils daily. 16 g 0 Past Week at Unknown time  . ibandronate (BONIVA) 150 MG tablet TAKE ONE TABLET BY MOUTH EVERY 30 DAYS 1 tablet 12 Past Week at Unknown time  . ibuprofen (ADVIL,MOTRIN) 200 MG tablet Take by mouth.   Past Week at Unknown time  . Misc Natural Products (OSTEO BI-FLEX JOINT SHIELD PO) Take 1 tablet by mouth daily.   Past Week at Unknown time  . Omega-3 Krill Oil 300 MG CAPS Take 1 tablet by mouth daily.   Past Week at Unknown time  . polyethylene glycol powder (GLYCOLAX/MIRALAX) powder Take 17 g by mouth daily. 850 g 5 Past Week at Unknown time  . SUMAtriptan (IMITREX) 100 MG tablet Take 1 tablet (100 mg total) by mouth as needed. 10 tablet 5 Past Week at Unknown time  . venlafaxine XR (EFFEXOR-XR) 75 MG 24 hr capsule Take 1 capsule (75 mg total) by mouth daily with breakfast. 90 capsule 0 11/19/2018 at Unknown time  . vitamin B-12 (CYANOCOBALAMIN) 1000 MCG tablet Take by mouth.    Past Week at Unknown time  . Cholecalciferol (VITAMIN D-3 PO) Take 5,000 Units by mouth daily.   Taking     No Known Allergies   Past Medical History:  Diagnosis Date  . Anginal pain ([place])   . Chronic constipation   . Complication of anesthesia    Bad headache when waking  . Dyspnea   . GERD (gastroesophageal reflux disease)   . Headache    occasional migraine  . Hyperlipidemia   . Left knee pain   . Mitral valve prolapse   . Osteopenia   . Urine incontinence   . Venous (peripheral) insufficiency   . Vitamin D deficiency     Review of systems:  Otherwise negative.    Physical Exam  Gen: Alert, oriented. Appears stated age.  HEENT: [place]/AT. PERRLA. Lungs: CTA, no wheezes. CV: RR nl S1, S2. Abd: soft, benign, no masses. BS+ Ext: No edema. Pulses 2+    Planned procedures: Proceed with EGD. The patient understands the nature of the planned procedure, indications, risks, alternatives and potential complications including but not limited to bleeding, infection, perforation, damage to internal organs and possible oversedation/side effects from anesthesia. The patient agrees and gives consent to proceed.  Please refer to procedure notes for findings, recommendations and patient disposition/instructions.     Bonnie Owens K. [name], M.D. Gastroenterology 11/20/2018  1:08 PM

## 2018-11-20 NOTE — Anesthesia Preprocedure Evaluation (Signed)
Anesthesia Evaluation  Patient identified by MRN, date of birth, ID band Patient awake    Reviewed: Allergy & Precautions, NPO status , Patient's Chart, lab work & pertinent test results  History of Anesthesia Complications Negative for: history of anesthetic complications  Airway Mallampati: II  TM Distance: >3 FB Neck ROM: Full    Dental no notable dental hx.    Pulmonary neg sleep apnea, neg COPD,    breath sounds clear to auscultation- rhonchi (-) wheezing      Cardiovascular Exercise Tolerance: Good (-) hypertension(-) CAD, (-) Past MI, (-) Cardiac Stents and (-) CABG  Rhythm:Regular Rate:Normal - Systolic murmurs and - Diastolic murmurs    Neuro/Psych  Headaches, negative psych ROS   GI/Hepatic Neg liver ROS, GERD  ,  Endo/Other  negative endocrine ROSneg diabetes  Renal/GU negative Renal ROS     Musculoskeletal  (+) Arthritis ,   Abdominal (+) - obese,   Peds  Hematology negative hematology ROS (+)   Anesthesia Other Findings Past Medical History: No date: Anginal pain ([facility]) No date: Chronic constipation No date: Complication of anesthesia     Comment:  Bad headache when waking No date: Dyspnea No date: GERD (gastroesophageal reflux disease) No date: Headache     Comment:  occasional migraine No date: Hyperlipidemia No date: Left knee pain No date: Mitral valve prolapse No date: Osteopenia No date: Urine incontinence No date: Venous (peripheral) insufficiency No date: Vitamin D deficiency   Reproductive/Obstetrics                             Anesthesia Physical Anesthesia Plan  ASA: II  Anesthesia Plan: General   Post-op Pain Management:    Induction: Intravenous  PONV Risk Score and Plan: 2 and Propofol infusion  Airway Management Planned: Natural Airway  Additional Equipment:   Intra-op Plan:   Post-operative Plan:   Informed Consent: I have reviewed  the patients History and Physical, chart, labs and discussed the procedure including the risks, benefits and alternatives for the proposed anesthesia with the patient or authorized representative who has indicated his/her understanding and acceptance.   Dental advisory given  Plan Discussed with: CRNA and Anesthesiologist  Anesthesia Plan Comments:         Anesthesia Quick Evaluation

## 2018-11-22 ENCOUNTER — Ambulatory Visit: Payer: Medicare HMO

## 2018-11-26 DIAGNOSIS — K449 Diaphragmatic hernia without obstruction or gangrene: Secondary | ICD-10-CM | POA: Insufficient documentation

## 2018-11-26 DIAGNOSIS — K2970 Gastritis, unspecified, without bleeding: Secondary | ICD-10-CM | POA: Insufficient documentation

## 2018-11-29 ENCOUNTER — Ambulatory Visit (INDEPENDENT_AMBULATORY_CARE_PROVIDER_SITE_OTHER): Payer: Medicare HMO

## 2018-11-29 VITALS — BP 110/74 | HR 102 | Temp 97.5°F | Resp 16 | Ht 62.0 in | Wt 153.4 lb

## 2018-11-29 DIAGNOSIS — Z0000 Encounter for general adult medical examination without abnormal findings: Secondary | ICD-10-CM | POA: Diagnosis not present

## 2018-11-29 NOTE — Patient Instructions (Addendum)
Bonnie Owens , Thank you for taking time to come for your Medicare Wellness Visit. I appreciate your ongoing commitment to your health goals. Please review the following plan we discussed and let me know if I can assist you in the future.   Screening recommendations/referrals: Colonoscopy: done 11/14/17 repeat in 2028 Mammogram: done 07/10/18 repeat next year Bone Density: done 12/31/17 repeat in 2021 Recommended yearly ophthalmology/optometry visit for glaucoma screening and checkup Recommended yearly dental visit for hygiene and checkup  Vaccinations: Influenza vaccine: done 08/16/18 Pneumococcal vaccine: done 11/20/16 Tdap vaccine: done 05/16/10 Shingles vaccine: done 12/13/17  Conditions/risks identified: Recommend increasing physical activity to 150 minutes per week  Next appointment: 12/09/2018 10:40 Dr. Ancil Boozer   Preventive Care 65 Years and Older, Female Preventive care refers to lifestyle choices and visits with your health care provider that can promote health and wellness. What does preventive care include?  A yearly physical exam. This is also called an annual well check.  Dental exams once or twice a year.  Routine eye exams. Ask your health care provider how often you should have your eyes checked.  Personal lifestyle choices, including:  Daily care of your teeth and gums.  Regular physical activity.  Eating a healthy diet.  Avoiding tobacco and drug use.  Limiting alcohol use.  Practicing safe sex.  Taking low-dose aspirin every day.  Taking vitamin and mineral supplements as recommended by your health care provider. What happens during an annual well check? The services and screenings done by your health care provider during your annual well check will depend on your age, overall health, lifestyle risk factors, and family history of disease. Counseling  Your health care provider may ask you questions about your:  Alcohol use.  Tobacco use.  Drug  use.  Emotional well-being.  Home and relationship well-being.  Sexual activity.  Eating habits.  History of falls.  Memory and ability to understand (cognition).  Work and work Statistician.  Reproductive health. Screening  You may have the following tests or measurements:  Height, weight, and BMI.  Blood pressure.  Lipid and cholesterol levels. These may be checked every 5 years, or more frequently if you are over 3 years old.  Skin check.  Lung cancer screening. You may have this screening every year starting at age 3 if you have a 30-pack-year history of smoking and currently smoke or have quit within the past 15 years.  Fecal occult blood test (FOBT) of the stool. You may have this test every year starting at age 39.  Flexible sigmoidoscopy or colonoscopy. You may have a sigmoidoscopy every 5 years or a colonoscopy every 10 years starting at age 65.  Hepatitis C blood test.  Hepatitis B blood test.  Sexually transmitted disease (STD) testing.  Diabetes screening. This is done by checking your blood sugar (glucose) after you have not eaten for a while (fasting). You may have this done every 1-3 years.  Bone density scan. This is done to screen for osteoporosis. You may have this done starting at age 59.  Mammogram. This may be done every 1-2 years. Talk to your health care provider about how often you should have regular mammograms. Talk with your health care provider about your test results, treatment options, and if necessary, the need for more tests. Vaccines  Your health care provider may recommend certain vaccines, such as:  Influenza vaccine. This is recommended every year.  Tetanus, diphtheria, and acellular pertussis (Tdap, Td) vaccine. You may need a Td  booster every 10 years.  Zoster vaccine. You may need this after age 4.  Pneumococcal 13-valent conjugate (PCV13) vaccine. One dose is recommended after age 66.  Pneumococcal polysaccharide  (PPSV23) vaccine. One dose is recommended after age 17. Talk to your health care provider about which screenings and vaccines you need and how often you need them. This information is not intended to replace advice given to you by your health care provider. Make sure you discuss any questions you have with your health care provider. Document Released: 12/17/2015 Document Revised: 08/09/2016 Document Reviewed: 09/21/2015 Elsevier Interactive Patient Education  2017 [place] Prevention in the Home Falls can cause injuries. They can happen to people of all ages. There are many things you can do to make your home safe and to help prevent falls. What can I do on the outside of my home?  Regularly fix the edges of walkways and driveways and fix any cracks.  Remove anything that might make you trip as you walk through a door, such as a raised step or threshold.  Trim any bushes or trees on the path to your home.  Use bright outdoor lighting.  Clear any walking paths of anything that might make someone trip, such as rocks or tools.  Regularly check to see if handrails are loose or broken. Make sure that both sides of any steps have handrails.  Any raised decks and porches should have guardrails on the edges.  Have any leaves, snow, or ice cleared regularly.  Use sand or salt on walking paths during winter.  Clean up any spills in your garage right away. This includes oil or grease spills. What can I do in the bathroom?  Use night lights.  Install grab bars by the toilet and in the tub and shower. Do not use towel bars as grab bars.  Use non-skid mats or decals in the tub or shower.  If you need to sit down in the shower, use a plastic, non-slip stool.  Keep the floor dry. Clean up any water that spills on the floor as soon as it happens.  Remove soap buildup in the tub or shower regularly.  Attach bath mats securely with double-sided non-slip rug tape.  Do not have  throw rugs and other things on the floor that can make you trip. What can I do in the bedroom?  Use night lights.  Make sure that you have a light by your bed that is easy to reach.  Do not use any sheets or blankets that are too big for your bed. They should not hang down onto the floor.  Have a firm chair that has side arms. You can use this for support while you get dressed.  Do not have throw rugs and other things on the floor that can make you trip. What can I do in the kitchen?  Clean up any spills right away.  Avoid walking on wet floors.  Keep items that you use a lot in easy-to-reach places.  If you need to reach something above you, use a strong step stool that has a grab bar.  Keep electrical cords out of the way.  Do not use floor polish or wax that makes floors slippery. If you must use wax, use non-skid floor wax.  Do not have throw rugs and other things on the floor that can make you trip. What can I do with my stairs?  Do not leave any items on the stairs.  Make sure that there are handrails on both sides of the stairs and use them. Fix handrails that are broken or loose. Make sure that handrails are as long as the stairways.  Check any carpeting to make sure that it is firmly attached to the stairs. Fix any carpet that is loose or worn.  Avoid having throw rugs at the top or bottom of the stairs. If you do have throw rugs, attach them to the floor with carpet tape.  Make sure that you have a light switch at the top of the stairs and the bottom of the stairs. If you do not have them, ask someone to add them for you. What else can I do to help prevent falls?  Wear shoes that:  Do not have high heels.  Have rubber bottoms.  Are comfortable and fit you well.  Are closed at the toe. Do not wear sandals.  If you use a stepladder:  Make sure that it is fully opened. Do not climb a closed stepladder.  Make sure that both sides of the stepladder are  locked into place.  Ask someone to hold it for you, if possible.  Clearly mark and make sure that you can see:  Any grab bars or handrails.  First and last steps.  Where the edge of each step is.  Use tools that help you move around (mobility aids) if they are needed. These include:  Canes.  Walkers.  Scooters.  Crutches.  Turn on the lights when you go into a dark area. Replace any light bulbs as soon as they burn out.  Set up your furniture so you have a clear path. Avoid moving your furniture around.  If any of your floors are uneven, fix them.  If there are any pets around you, be aware of where they are.  Review your medicines with your doctor. Some medicines can make you feel dizzy. This can increase your chance of falling. Ask your doctor what other things that you can do to help prevent falls. This information is not intended to replace advice given to you by your health care provider. Make sure you discuss any questions you have with your health care provider. Document Released: 09/16/2009 Document Revised: 04/27/2016 Document Reviewed: 12/25/2014 Elsevier Interactive Patient Education  2017 [employer].

## 2018-11-29 NOTE — Progress Notes (Signed)
Subjective:   Bonnie Owens is a 67 y.o. female who presents for Medicare Annual (Subsequent) preventive examination.  Review of Systems:   Cardiac Risk Factors include: advanced age (>55men, >39 women)     Objective:     Vitals: BP 110/74 (BP Location: Left Arm, Patient Position: Sitting, Cuff Size: Normal)   Pulse (!) 102   Temp (!) 97.5 F (36.4 C) (Oral)   Resp 16   Ht 5\' 2"  (1.575 m)   Wt 153 lb 6.4 oz (69.6 kg)   LMP  (LMP Unknown)   SpO2 98%   BMI 28.06 kg/m   Body mass index is 28.06 kg/m.  Advanced Directives 11/29/2018 11/20/2018 11/20/2018 05/14/2017 11/20/2016 03/09/2016 03/09/2016  Does Patient Have a Medical Advance Directive? Yes Yes No;Yes No Yes Yes Yes  Type of Paramedic of [place];Living will - Out of facility DNR (pink MOST or yellow form) - [place];Living will Living will Living will;Healthcare Power of Attorney  Does patient want to make changes to medical advance directive? - - - - - No - Patient declined No - Patient declined  Copy of [place] in Chart? Yes - validated most recent copy scanned in chart (See row information) - - - No - copy requested Yes Yes  Would patient like information on creating a medical advance directive? - - - - - Yes - Educational materials given No - patient declined information;Yes - Scientist, clinical (histocompatibility and immunogenetics) given    Tobacco Social History   Tobacco Use  Smoking Status Never Smoker  Smokeless Tobacco Never Used     Counseling given: Not Answered   Clinical Intake:  Pre-visit preparation completed: Yes  Pain : No/denies pain     Nutritional Status: BMI 25 -29 Overweight Diabetes: No  How often do you need to have someone help you when you read instructions, pamphlets, or other written materials from your doctor or pharmacy?: 1 - Never What is the last grade level you completed in school?: high school graduate  Interpreter Needed?:  No  Information entered by :: Clemetine Marker LPN  Past Medical History:  Diagnosis Date  . Anemia   . Anginal pain ([place])   . Chronic constipation   . Complication of anesthesia    Bad headache when waking  . Depression   . Dyspnea   . GERD (gastroesophageal reflux disease)   . Headache    occasional migraine  . Hyperlipidemia   . Left knee pain   . Mitral valve prolapse   . Osteopenia   . Urine incontinence   . Venous (peripheral) insufficiency   . Vitamin D deficiency    Past Surgical History:  Procedure Laterality Date  . ABDOMINAL HYSTERECTOMY    . APPENDECTOMY    . BASAL CELL CARCINOMA EXCISION Left 10/2016   Underneath Left Eye- [place] [facility]- Dr. Nehemiah Massed  . COLONOSCOPY N/A 11/14/2017   Procedure: COLONOSCOPY;  Surgeon: Toledo, Benay Pike, MD;  Location: [facility] ENDOSCOPY;  Service: Endoscopy;  Laterality: N/A;  . ESOPHAGOGASTRODUODENOSCOPY (EGD) WITH PROPOFOL N/A 11/20/2018   Procedure: ESOPHAGOGASTRODUODENOSCOPY (EGD) WITH PROPOFOL;  Surgeon: Toledo, Benay Pike, MD;  Location: [facility] ENDOSCOPY;  Service: Gastroenterology;  Laterality: N/A;  . JOINT REPLACEMENT     (LT) TKR 2017  . lumbar descectomy    . sling procedure    . TOTAL KNEE ARTHROPLASTY Left 03/09/2016   Procedure: TOTAL KNEE ARTHROPLASTY;  Surgeon: Hessie Knows, MD;  Location: [facility] ORS;  Service: Orthopedics;  Laterality: Left;  . TUBAL LIGATION    . tummy tuck     Family History  Problem Relation Age of Onset  . Breast cancer Sister 95  . Cancer Sister        breast  . Berenice Primas' disease Sister   . Goiter Sister   . Dementia Mother   . Cancer Father        liver  . Thyroid disease Brother   . Breast cancer Maternal Grandmother   . Dysfunctional uterine bleeding Daughter    Social History   Socioeconomic History  . Marital status: Married    Spouse name: Coralyn Mark   . Number of children: 2  . Years of education: Not on file  . Highest education level: 12th grade  Occupational History  . Occupation:  Estate agent. 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 Financial resource strain: Not hard at all  . Food insecurity:    Worry: Never true    Inability: Never true  . Transportation needs:    Medical: No    Non-medical: No  Tobacco Use  . Smoking status: Never Smoker  . Smokeless tobacco: Never Used  Substance and Sexual Activity  . Alcohol use: Yes    Alcohol/week: 1.0 standard drinks    Types: 1 Glasses of wine per week    Comment: weekly  . Drug use: No  . Sexual activity: Yes    Partners: Male    Birth control/protection: Surgical, Post-menopausal  Lifestyle  . Physical activity:    Days per week: 0 days    Minutes per session: 0 min  . Stress: To some extent  Relationships  . Social connections:    Talks on phone: More than three times a week    Gets together: Twice a week    Attends religious service: More than 4 times per year    Active member of club or organization: No    Attends meetings of clubs or organizations: Never    Relationship status: Married  Other Topics Concern  . Not on file  Social History Narrative   Husband is a Engineer, agricultural    She has 3 grandson.     Outpatient Encounter Medications as of 11/29/2018  Medication Sig  . albuterol (PROVENTIL HFA) 108 (90 Base) MCG/ACT inhaler Inhale into the lungs.  . Ascorbic Acid (VITAMIN C PO) Take 1,000 mg by mouth daily.  Marland Kitchen aspirin 81 MG tablet Take 81 mg by mouth daily.  . Cholecalciferol (VITAMIN D-3 PO) Take 5,000 Units by mouth daily.  Marland Kitchen diltiazem (CARDIZEM CD) 120 MG 24 hr capsule Take 1 capsule (120 mg total) by mouth daily.  . fluticasone (FLONASE) 50 MCG/ACT nasal spray Place 2 sprays into both nostrils daily.  Marland Kitchen ibandronate (BONIVA) 150 MG tablet TAKE ONE TABLET BY MOUTH EVERY 30 DAYS  . ibuprofen (ADVIL,MOTRIN) 200 MG tablet Take by mouth.  . Misc Natural Products (OSTEO BI-FLEX JOINT SHIELD PO) Take 1 tablet by mouth daily.  . Omega-3 Krill Oil 300 MG CAPS Take 1 tablet by mouth  daily.  . pantoprazole (PROTONIX) 40 MG tablet Take by mouth.  . polyethylene glycol powder (GLYCOLAX/MIRALAX) powder Take 17 g by mouth daily.  . SUMAtriptan (IMITREX) 100 MG tablet Take 1 tablet (100 mg total) by mouth as needed.  . venlafaxine XR (EFFEXOR-XR) 75 MG 24 hr capsule Take 1 capsule (75 mg total) by mouth daily with breakfast.  . vitamin B-12 (CYANOCOBALAMIN) 1000 MCG  tablet Take by mouth.   No facility-administered encounter medications on file as of 11/29/2018.     Activities of Daily Living In your present state of health, do you have any difficulty performing the following activities: 11/29/2018 10/03/2018  Hearing? N N  Comment declines hearing aids -  Vision? N N  Comment wears contacts -  Difficulty concentrating or making decisions? N N  Walking or climbing stairs? N N  Dressing or bathing? N N  Doing errands, shopping? N N  Preparing Food and eating ? N -  Using the Toilet? N -  In the past six months, have you accidently leaked urine? N -  Do you have problems with loss of bowel control? N -  Managing your Medications? N -  Managing your Finances? N -  Housekeeping or managing your Housekeeping? N -  Some recent data might be hidden    Patient Care Team: Steele Sizer, MD as PCP - General (Family Medicine) Ubaldo Glassing Javier Docker, MD as Consulting Physician (Cardiology) Efrain Sella, MD as Consulting Physician (Gastroenterology) Hessie Knows, MD as Consulting Physician (Orthopedic Surgery)    Assessment:   This is a routine wellness examination for [place].  Exercise Activities and Dietary recommendations Current Exercise Habits: The patient does not participate in regular exercise at present, Exercise limited by: None identified  Goals    . Increase physical activity     Recommend increasing physical activity to 150 minutes per week.        Fall Risk Fall Risk  11/29/2018 10/03/2018 08/16/2018 11/21/2017 11/20/2016  Falls in the past year? 0  No No No No  Number falls in past yr: 0 - - - -   FALL RISK PREVENTION PERTAINING TO THE HOME:  Any stairs in or around the home WITH handrails? Yes  Home free of loose throw rugs in walkways, pet beds, electrical cords, etc? Yes  Adequate lighting in your home to reduce risk of falls? Yes   ASSISTIVE DEVICES UTILIZED TO PREVENT FALLS:  Life alert? No  Use of a cane, walker or w/c? No  Grab bars in the bathroom? No  Shower chair or bench in shower? No  Elevated toilet seat or a handicapped toilet? No   DME ORDERS:  DME order needed?  No   TIMED UP AND GO:  Was the test performed? Yes .  Length of time to ambulate 10 feet: 5 sec.   GAIT:  Appearance of gait: Gait stead-fast and without the use of an assistive device.  Education: Fall risk prevention has been discussed.  Intervention(s) required? No   Depression Screen PHQ 2/9 Scores 11/29/2018 10/03/2018 08/16/2018 11/21/2017  PHQ - 2 Score 0 1 0 0  PHQ- 9 Score 2 7 1  -     Cognitive Function     6CIT Screen 11/29/2018  What Year? 0 points  What month? 0 points  What time? 0 points  Count back from 20 0 points  Months in reverse 2 points  Repeat phrase 2 points  Total Score 4    Immunization History  Administered Date(s) Administered  . Influenza Split 10/03/2012  . Influenza, High Dose Seasonal PF 08/16/2018  . Influenza, Seasonal, Injecte, Preservative Fre 10/02/2011  . Influenza,inj,Quad PF,6+ Mos 08/24/2014, 09/16/2015, 09/29/2016  . Influenza-Unspecified 08/24/2014, 09/14/2017  . Pneumococcal Conjugate-13 11/20/2016  . Pneumococcal Polysaccharide-23 04/14/2015  . Tdap 05/16/2010  . Zoster 10/02/2011  . Zoster Recombinat (Shingrix) 09/14/2017, 12/13/2017    Qualifies for Shingles Vaccine? Yes  Shingrix series completed.  Tdap: Up to date  Flu Vaccine: Up to date   Pneumococcal Vaccine: Up to date   Screening Tests Health Maintenance  Topic Date Due  . TETANUS/TDAP  05/16/2020  .  MAMMOGRAM  07/10/2020  . COLONOSCOPY  11/15/2027  . INFLUENZA VACCINE  Completed  . DEXA SCAN  Completed  . Hepatitis C Screening  Completed  . PNA vac Low Risk Adult  Completed    Cancer Screenings:  Colorectal Screening: Completed 11/14/17. Repeat every 10 years;   Mammogram: Completed 07/10/18. Repeat every year;   Bone Density: Completed 12/31/17. Results reflect OSTEOPENIA, Repeat every 2 years.   Lung Cancer Screening: (Low Dose CT Chest recommended if Age 54-80 years, 30 pack-year currently smoking OR have quit w/in 15years.) does not qualify.    Additional Screening:  Hepatitis C Screening: does qualify; Completed 11/17/15  Vision Screening: Recommended annual ophthalmology exams for early detection of glaucoma and other disorders of the eye. Is the patient up to date with their annual eye exam?  Yes  Who is the provider or what is the name of the office in which the pt attends annual eye exams? [place] Screening: Recommended annual dental exams for proper oral hygiene  Community Resource Referral:  CRR required this visit?  No      Plan:    I have personally reviewed and addressed the Medicare Annual Wellness questionnaire and have noted the following in the patient's chart:  A. Medical and social history B. Use of alcohol, tobacco or illicit drugs  C. Current medications and supplements D. Functional ability and status E.  Nutritional status F.  Physical activity G. Advance directives H. List of other physicians I.  Hospitalizations, surgeries, and ER visits in previous 12 months J.  [place] such as hearing and vision if needed, cognitive and depression L. Referrals and appointments   In addition, I have reviewed and discussed with patient certain preventive protocols, quality metrics, and best practice recommendations. A written personalized care plan for preventive services as well as general preventive health recommendations  were provided to patient.   Signed,  Clemetine Marker, LPN  Nurse Health Advisor   Nurse Notes:Pt doing well, recent endoscopy revealed hiatal hernia and stretching of her esophagus. Pt c/o still having frequent hot flashes that cause nausea and states there does not seem to be a trigger or pattern to them. She had 3 episodes on Christmas day and 2 yesterday. Pt scheduled for physical next week and plans to discuss at that visit.

## 2018-12-09 ENCOUNTER — Encounter: Payer: Medicare HMO | Admitting: Family Medicine

## 2018-12-18 DIAGNOSIS — J029 Acute pharyngitis, unspecified: Secondary | ICD-10-CM | POA: Diagnosis not present

## 2018-12-18 DIAGNOSIS — R6889 Other general symptoms and signs: Secondary | ICD-10-CM | POA: Diagnosis not present

## 2018-12-18 DIAGNOSIS — R05 Cough: Secondary | ICD-10-CM | POA: Diagnosis not present

## 2018-12-18 DIAGNOSIS — J101 Influenza due to other identified influenza virus with other respiratory manifestations: Secondary | ICD-10-CM | POA: Diagnosis not present

## 2018-12-26 ENCOUNTER — Ambulatory Visit (INDEPENDENT_AMBULATORY_CARE_PROVIDER_SITE_OTHER): Payer: Medicare HMO | Admitting: Nurse Practitioner

## 2018-12-26 ENCOUNTER — Other Ambulatory Visit: Payer: Self-pay | Admitting: Family Medicine

## 2018-12-26 ENCOUNTER — Encounter: Payer: Self-pay | Admitting: Nurse Practitioner

## 2018-12-26 VITALS — BP 114/78 | HR 95 | Temp 97.9°F | Resp 16 | Ht 62.0 in | Wt 152.4 lb

## 2018-12-26 DIAGNOSIS — J0110 Acute frontal sinusitis, unspecified: Secondary | ICD-10-CM

## 2018-12-26 DIAGNOSIS — G43009 Migraine without aura, not intractable, without status migrainosus: Secondary | ICD-10-CM

## 2018-12-26 DIAGNOSIS — H6993 Unspecified Eustachian tube disorder, bilateral: Secondary | ICD-10-CM

## 2018-12-26 MED ORDER — FLUTICASONE PROPIONATE 50 MCG/ACT NA SUSP: 2.0000 | Freq: Every day | NASAL | 0 refills | Status: DC

## 2018-12-26 MED ORDER — LORATADINE 10 MG PO TABS: 10.0000 mg | ORAL_TABLET | Freq: Every day | ORAL | 0 refills | Status: DC

## 2018-12-26 MED ORDER — AMOXICILLIN-POT CLAVULANATE 875-125 MG PO TABS: 1.0000 | ORAL_TABLET | Freq: Two times a day (BID) | ORAL | 0 refills | Status: DC

## 2018-12-26 NOTE — Progress Notes (Signed)
Name: Bonnie Owens   MRN: [id number]    DOB: 12-20-1950   Date:12/26/2018       Progress Note  Subjective  Chief Complaint  Chief Complaint  Patient presents with  . Influenza    patient stated that she has not gotten any better, was given a Z-pack and Tamiflu  . Cough    also was given a cough syrup with codeine. productive - thick and clear  . Headache    right frontal   . Nasal Congestion    clear  . Medication Refill    Sumatriptan    HPI  Was diagnosed with the flu last week- completed tamiflu and z-pack. States cough medicine makes her so drowsy she can only take it at night. Patient states none of her symptoms have improved- states they are about the same. She is having night sweats, cough productive thick clear. Lots of nasal congestion- producing thick clear congestion. Right frontal headache behind eye. Endorses chest soreness when she is coughing,  Has been taking tylenol and musinex without relief.   Denies chest pain   Patient Active Problem List   Diagnosis Date Noted  . Gastritis 11/26/2018  . Hiatal hernia 11/26/2018  . Flushing 09/04/2018  . Palpitations 03/19/2017  . History of basal cell carcinoma 11/20/2016  . Primary osteoarthritis of left knee 03/09/2016  . History of total knee replacement, left 03/09/2016  . Osteopenia 11/17/2015  . Migraine without aura and without status migrainosus, not intractable 11/17/2015  . Urge urinary incontinence 11/17/2015  . Dyslipidemia 11/17/2015  . Large breasts 11/17/2015  . Menopause 11/17/2015  . Vitamin D deficiency 11/17/2015  . MVP (mitral valve prolapse) 11/17/2015  . History of lumbar fusion 11/17/2015  . Chronic constipation 11/17/2015  . Venous insufficiency 11/17/2015  . GERD (gastroesophageal reflux disease) 11/17/2015  . Eustachian tube dysfunction 11/17/2015  . Sciatica of left side     Past Medical History:  Diagnosis Date  . Anemia   . Anginal pain ([place])   . Chronic constipation   .  Complication of anesthesia    Bad headache when waking  . Depression   . Dyspnea   . GERD (gastroesophageal reflux disease)   . Headache    occasional migraine  . Hyperlipidemia   . Left knee pain   . Mitral valve prolapse   . Osteopenia   . Urine incontinence   . Venous (peripheral) insufficiency   . Vitamin D deficiency     Past Surgical History:  Procedure Laterality Date  . ABDOMINAL HYSTERECTOMY    . APPENDECTOMY    . BASAL CELL CARCINOMA EXCISION Left 10/2016   Underneath Left Eye- [place] [facility]- Dr. Nehemiah Massed  . COLONOSCOPY N/A 11/14/2017   Procedure: COLONOSCOPY;  Surgeon: Toledo, Benay Pike, MD;  Location: [facility] ENDOSCOPY;  Service: Endoscopy;  Laterality: N/A;  . ESOPHAGOGASTRODUODENOSCOPY (EGD) WITH PROPOFOL N/A 11/20/2018   Procedure: ESOPHAGOGASTRODUODENOSCOPY (EGD) WITH PROPOFOL;  Surgeon: Toledo, Benay Pike, MD;  Location: [facility] ENDOSCOPY;  Service: Gastroenterology;  Laterality: N/A;  . JOINT REPLACEMENT     (LT) TKR 2017  . lumbar descectomy    . sling procedure    . TOTAL KNEE ARTHROPLASTY Left 03/09/2016   Procedure: TOTAL KNEE ARTHROPLASTY;  Surgeon: Hessie Knows, MD;  Location: [facility] ORS;  Service: Orthopedics;  Laterality: Left;  . TUBAL LIGATION    . tummy tuck      Social History   Tobacco Use  . Smoking status: Never Smoker  . Smokeless tobacco: Never  Used  Substance Use Topics  . Alcohol use: Yes    Alcohol/week: 1.0 standard drinks    Types: 1 Glasses of wine per week    Comment: weekly     Current Outpatient Medications:  .  Ascorbic Acid (VITAMIN C PO), Take 1,000 mg by mouth daily., Disp: , Rfl:  .  aspirin 81 MG tablet, Take 81 mg by mouth daily., Disp: , Rfl:  .  Cholecalciferol (VITAMIN D-3 PO), Take 5,000 Units by mouth daily., Disp: , Rfl:  .  diltiazem (CARDIZEM CD) 120 MG 24 hr capsule, Take 1 capsule (120 mg total) by mouth daily., Disp: 30 capsule, Rfl: 12 .  fluticasone (FLONASE) 50 MCG/ACT nasal spray, Place 2 sprays into both  nostrils daily., Disp: 16 g, Rfl: 0 .  ibandronate (BONIVA) 150 MG tablet, TAKE ONE TABLET BY MOUTH EVERY 30 DAYS, Disp: 1 tablet, Rfl: 12 .  ibuprofen (ADVIL,MOTRIN) 200 MG tablet, Take by mouth., Disp: , Rfl:  .  Misc Natural Products (OSTEO BI-FLEX JOINT SHIELD PO), Take 1 tablet by mouth daily., Disp: , Rfl:  .  Omega-3 Krill Oil 300 MG CAPS, Take 1 tablet by mouth daily., Disp: , Rfl:  .  pantoprazole (PROTONIX) 40 MG tablet, Take by mouth., Disp: , Rfl:  .  polyethylene glycol powder (GLYCOLAX/MIRALAX) powder, Take 17 g by mouth daily., Disp: 850 g, Rfl: 5 .  SUMAtriptan (IMITREX) 100 MG tablet, Take 1 tablet (100 mg total) by mouth as needed., Disp: 10 tablet, Rfl: 5 .  venlafaxine XR (EFFEXOR-XR) 75 MG 24 hr capsule, Take 1 capsule (75 mg total) by mouth daily with breakfast., Disp: 90 capsule, Rfl: 0 .  vitamin B-12 (CYANOCOBALAMIN) 1000 MCG tablet, Take by mouth., Disp: , Rfl:  .  albuterol (PROVENTIL HFA) 108 (90 Base) MCG/ACT inhaler, Inhale into the lungs., Disp: , Rfl:   No Known Allergies  ROS   No other specific complaints in a complete review of systems (except as listed in HPI above).  Objective  Vitals:   12/26/18 1024  BP: 114/78  Pulse: 95  Resp: 16  Temp: 97.9 F (36.6 C)  TempSrc: Oral  SpO2: 94%  Weight: 152 lb 6.4 oz (69.1 kg)  Height: 5\' 2"  (1.575 m)     Body mass index is 27.87 kg/m.  Nursing Note and Vital Signs reviewed.  Physical Exam HENT:     Head: Normocephalic and atraumatic.     Right Ear: Hearing, ear canal and external ear normal. A middle ear effusion is present.     Left Ear: Hearing, ear canal and external ear normal. A middle ear effusion is present.     Nose: Mucosal edema and congestion present.     Right Turbinates: Not enlarged.     Left Turbinates: Not enlarged.     Right Sinus: Frontal sinus tenderness present. No maxillary sinus tenderness.     Left Sinus: No maxillary sinus tenderness or frontal sinus tenderness.      Mouth/Throat:     Mouth: Mucous membranes are moist.     Pharynx: Uvula midline. No oropharyngeal exudate or posterior oropharyngeal erythema.  Eyes:     General:        Right eye: No discharge.        Left eye: No discharge.     Conjunctiva/sclera: Conjunctivae normal.  Neck:     Musculoskeletal: Normal range of motion.  Cardiovascular:     Rate and Rhythm: Normal rate.  Pulmonary:  Effort: Pulmonary effort is normal.     Breath sounds: Normal breath sounds.  Lymphadenopathy:     Cervical: No cervical adenopathy.  Skin:    General: Skin is warm and dry.     Findings: No rash.  Neurological:     Mental Status: She is alert.  Psychiatric:        Judgment: Judgment normal.      No results found for this or any previous visit (from the past 48 hour(s)).  Assessment & Plan  1. Acute non-recurrent frontal sinusitis - amoxicillin-clavulanate (AUGMENTIN) 875-125 MG tablet; Take 1 tablet by mouth 2 (two) times daily.  Dispense: 20 tablet; Refill: 0 - fluticasone (FLONASE) 50 MCG/ACT nasal spray; Place 2 sprays into both nostrils daily.  Dispense: 16 g; Refill: 0 - loratadine (CLARITIN) 10 MG tablet; Take 1 tablet (10 mg total) by mouth daily.  Dispense: 30 tablet; Refill: 0  2. Disorder of both eustachian tubes - fluticasone (FLONASE) 50 MCG/ACT nasal spray; Place 2 sprays into both nostrils daily.  Dispense: 16 g; Refill: 0 - loratadine (CLARITIN) 10 MG tablet; Take 1 tablet (10 mg total) by mouth daily.  Dispense: 30 tablet; Refill: 0

## 2018-12-26 NOTE — Telephone Encounter (Signed)
Refill request for general medication: Imitrex 100 mg  Last office visit: 12/26/2018  Last physical exam: 11/29/2018  Follow-ups on file. 02/12/2019

## 2018-12-26 NOTE — Patient Instructions (Addendum)
Please take your antibiotic as prescribed with food on your stomach to prevent nausea and upset stomach. Take the antibiotic for the entire course, even if your symptoms resolve before the course if completed. Using antibiotics inappropriately can make it harder to treat future infections. If you have any concerning side effects please stop the medication and let us know immediately. I do recommend taking a probiotic to replenish your good gut health anytime you take an antibiotic. You can get probiotics in types of yogurt like activia, or other food/drinks such as kimchi, kombucha , sauerkraut, or you can take an over the counter supplement.    Sinusitis, Adult Sinusitis is soreness and swelling (inflammation) of your sinuses. Sinuses are hollow spaces in the bones around your face. They are located:  Around your eyes.  In the middle of your forehead.  Behind your nose.  In your cheekbones. Your sinuses and nasal passages are lined with a fluid called mucus. Mucus drains out of your sinuses. Swelling can trap mucus in your sinuses. This lets germs (bacteria, virus, or fungus) grow, which leads to infection. Most of the time, this condition is caused by a virus. What are the causes? This condition is caused by:  Allergies.  Asthma.  Germs.  Things that block your nose or sinuses.  Growths in the nose (nasal polyps).  Chemicals or irritants in the air.  Fungus (rare). What increases the risk? You are more likely to develop this condition if:  You have a weak body defense system (immune system).  You do a lot of swimming or diving.  You use nasal sprays too much.  You smoke. What are the signs or symptoms? The main symptoms of this condition are pain and a feeling of pressure around the sinuses. Other symptoms include:  Stuffy nose (congestion).  Runny nose (drainage).  Swelling and warmth in the sinuses.  Headache.  Toothache.  A cough that may get worse at  night.  Mucus that collects in the throat or the back of the nose (postnasal drip).  Being unable to smell and taste.  Being very tired (fatigue).  A fever.  Sore throat.  Bad breath. How is this diagnosed? This condition is diagnosed based on:  Your symptoms.  Your medical history.  A physical exam.  Tests to find out if your condition is short-term (acute) or long-term (chronic). Your doctor may: ? Check your nose for growths (polyps). ? Check your sinuses using a tool that has a light (endoscope). ? Check for allergies or germs. ? Do imaging tests, such as an MRI or CT scan. How is this treated? Treatment for this condition depends on the cause and whether it is short-term or long-term.  If caused by a virus, your symptoms should go away on their own within 10 days. You may be given medicines to relieve symptoms. They include: ? Medicines that shrink swollen tissue in the nose. ? Medicines that treat allergies (antihistamines). ? A spray that treats swelling of the nostrils. ? Rinses that help get rid of thick mucus in your nose (nasal saline washes).  If caused by bacteria, your doctor may wait to see if you will get better without treatment. You may be given antibiotic medicine if you have: ? A very bad infection. ? A weak body defense system.  If caused by growths in the nose, you may need to have surgery. Follow these instructions at home: Medicines  Take, use, or apply over-the-counter and prescription medicines only as  told by your doctor. These may include nasal sprays.  If you were prescribed an antibiotic medicine, take it as told by your doctor. Do not stop taking the antibiotic even if you start to feel better. Hydrate and humidify   Drink enough water to keep your pee (urine) pale yellow.  Use a cool mist humidifier to keep the humidity level in your home above 50%.  Breathe in steam for 10-15 minutes, 3-4 times a day, or as told by your doctor.  You can do this in the bathroom while a hot shower is running.  Try not to spend time in cool or dry air. Rest  Rest as much as you can.  Sleep with your head raised (elevated).  Make sure you get enough sleep each night. General instructions   Put a warm, moist washcloth on your face 3-4 times a day, or as often as told by your doctor. This will help with discomfort.  Wash your hands often with soap and water. If there is no soap and water, use hand sanitizer.  Do not smoke. Avoid being around people who are smoking (secondhand smoke).  Keep all follow-up visits as told by your doctor. This is important. Contact a doctor if:  You have a fever.  Your symptoms get worse.  Your symptoms do not get better within 10 days. Get help right away if:  You have a very bad headache.  You cannot stop throwing up (vomiting).  You have very bad pain or swelling around your face or eyes.  You have trouble seeing.  You feel confused.  Your neck is stiff.  You have trouble breathing. Summary  Sinusitis is swelling of your sinuses. Sinuses are hollow spaces in the bones around your face.  This condition is caused by tissues in your nose that become inflamed or swollen. This traps germs. These can lead to infection.  If you were prescribed an antibiotic medicine, take it as told by your doctor. Do not stop taking it even if you start to feel better.  Keep all follow-up visits as told by your doctor. This is important. This information is not intended to replace advice given to you by your health care provider. Make sure you discuss any questions you have with your health care provider. Document Released: 05/08/2008 Document Revised: 04/22/2018 Document Reviewed: 04/22/2018 Elsevier Interactive Patient Education  2019 [employer].

## 2018-12-30 ENCOUNTER — Other Ambulatory Visit: Payer: Self-pay | Admitting: Family Medicine

## 2018-12-30 DIAGNOSIS — R232 Flushing: Secondary | ICD-10-CM

## 2018-12-30 NOTE — Telephone Encounter (Signed)
Refill request for general medication. Effexor to [facility].   Last office visit 10/03/2018  Follow up on 02/12/2019

## 2019-01-20 DIAGNOSIS — K209 Esophagitis, unspecified: Secondary | ICD-10-CM | POA: Diagnosis not present

## 2019-01-31 ENCOUNTER — Other Ambulatory Visit: Payer: Self-pay | Admitting: Family Medicine

## 2019-02-12 ENCOUNTER — Encounter

## 2019-02-12 ENCOUNTER — Encounter: Payer: Self-pay | Admitting: Family Medicine

## 2019-02-12 ENCOUNTER — Ambulatory Visit (INDEPENDENT_AMBULATORY_CARE_PROVIDER_SITE_OTHER): Payer: Medicare HMO | Admitting: Family Medicine

## 2019-02-12 ENCOUNTER — Other Ambulatory Visit: Payer: Self-pay

## 2019-02-12 VITALS — BP 110/64 | HR 83 | Temp 98.1°F | Resp 18 | Ht 62.0 in | Wt 147.5 lb

## 2019-02-12 DIAGNOSIS — Z01419 Encounter for gynecological examination (general) (routine) without abnormal findings: Secondary | ICD-10-CM | POA: Diagnosis not present

## 2019-02-12 NOTE — Progress Notes (Signed)
Name: Bonnie Owens   MRN: [id number]    DOB: 1951-01-27   Date:02/12/2019       Progress Note  Subjective  Chief Complaint  Chief Complaint  Patient presents with  . Annual Exam    HPI   Patient presents for annual CPE and follow up   Diet: she is on weight watchers since 01/27/2019, she lost 5 lbs since she joined  Exercise: She is going to join [employer]  USPSTF grade A and B recommendations    Office Visit from 02/12/2019 in [facility]  AUDIT-C Score  1     Depression:  Depression screen [facility] 2/9 02/12/2019 12/26/2018 11/29/2018 10/03/2018 08/16/2018  Decreased Interest 0 0 0 0 0  Down, Depressed, Hopeless 0 0 0 1 0  PHQ - 2 Score 0 0 0 1 0  Altered sleeping 1 0 1 3 0  Tired, decreased energy 1 0 1 3 1   Change in appetite 0 0 0 0 0  Feeling bad or failure about yourself  0 0 0 0 0  Trouble concentrating 0 0 0 0 0  Moving slowly or fidgety/restless 0 0 0 0 0  Suicidal thoughts 0 0 0 0 0  PHQ-9 Score 2 0 2 7 1   Difficult doing work/chores Not difficult at all - - Not difficult at all Not difficult at all   Hypertension: BP Readings from Last 3 Encounters:  02/12/19 110/64  12/26/18 114/78  11/29/18 110/74   Obesity: Wt Readings from Last 3 Encounters:  02/12/19 147 lb 8 oz (66.9 kg)  12/26/18 152 lb 6.4 oz (69.1 kg)  11/29/18 153 lb 6.4 oz (69.6 kg)   BMI Readings from Last 3 Encounters:  02/12/19 26.98 kg/m  12/26/18 27.87 kg/m  11/29/18 28.06 kg/m    Hep C Screening: negative screen  STD testing and prevention (HIV/chl/gon/syphilis): N/A Intimate partner violence: negative screen  Sexual History/Pain during Intercourse:  She has been sexually active and not pain  Menstrual History/LMP/Abnormal Bleeding: discussed post-menopausal bleeding  Incontinence Symptoms: she has some urine incontinence , only when bladder is full, wears a panty liner   Advanced Care Planning: A voluntary discussion about advance care planning  including the explanation and discussion of advance directives.  Discussed health care proxy and Living will, and the patient was able to identify a health care proxy as husband .  Patient does have a living will at present time.   Breast cancer: up to date   BRCA gene screening: only one person - grandmother with breast cancer    Osteoporosis Screening: osteopenia.  Lipids:  Lab Results  Component Value Date   CHOL 251 (H) 10/03/2018   CHOL 258 (H) 11/21/2017   CHOL 259 (H) 11/20/2016   Lab Results  Component Value Date   HDL 59 10/03/2018   HDL 69 11/21/2017   HDL 58 11/20/2016   Lab Results  Component Value Date   LDLCALC 167 (H) 10/03/2018   LDLCALC 166 (H) 11/21/2017   LDLCALC 176 (H) 11/20/2016   Lab Results  Component Value Date   TRIG 120 10/03/2018   TRIG 115 11/21/2017   TRIG 127 11/20/2016   Lab Results  Component Value Date   CHOLHDL 4.3 10/03/2018   CHOLHDL 3.7 11/21/2017   CHOLHDL 4.5 11/20/2016   No results found for: LDLDIRECT  Glucose:  Glucose, Bld  Date Value Ref Range Status  08/16/2018 89 65 - 99 mg/dL Final    Comment:    .  Fasting reference interval .   11/21/2017 96 65 - 99 mg/dL Final    Comment:    .            Fasting reference interval .   11/20/2016 97 65 - 99 mg/dL Final    Skin cancer: discussed atypical lesions  Colorectal cancer: up to date  Lung cancer:   Low Dose CT Chest recommended if Age 46-80 years, 30 pack-year currently smoking OR have quit w/in 15years. Patient does not qualify.   ECG: 2018   Patient Active Problem List   Diagnosis Date Noted  . Gastritis 11/26/2018  . Hiatal hernia 11/26/2018  . Flushing 09/04/2018  . Palpitations 03/19/2017  . History of basal cell carcinoma 11/20/2016  . Primary osteoarthritis of left knee 03/09/2016  . History of total knee replacement, left 03/09/2016  . Osteopenia 11/17/2015  . Migraine without aura and without status migrainosus, not intractable  11/17/2015  . Urge urinary incontinence 11/17/2015  . Dyslipidemia 11/17/2015  . Large breasts 11/17/2015  . Menopause 11/17/2015  . Vitamin D deficiency 11/17/2015  . MVP (mitral valve prolapse) 11/17/2015  . History of lumbar fusion 11/17/2015  . Chronic constipation 11/17/2015  . Venous insufficiency 11/17/2015  . GERD (gastroesophageal reflux disease) 11/17/2015  . Eustachian tube dysfunction 11/17/2015  . Sciatica of left side     Past Surgical History:  Procedure Laterality Date  . ABDOMINAL HYSTERECTOMY    . APPENDECTOMY    . BASAL CELL CARCINOMA EXCISION Left 10/2016   Underneath Left Eye- [place] [facility]- Dr. Nehemiah Massed  . COLONOSCOPY N/A 11/14/2017   Procedure: COLONOSCOPY;  Surgeon: Toledo, Benay Pike, MD;  Location: [facility] ENDOSCOPY;  Service: Endoscopy;  Laterality: N/A;  . ESOPHAGOGASTRODUODENOSCOPY (EGD) WITH PROPOFOL N/A 11/20/2018   Procedure: ESOPHAGOGASTRODUODENOSCOPY (EGD) WITH PROPOFOL;  Surgeon: Toledo, Benay Pike, MD;  Location: [facility] ENDOSCOPY;  Service: Gastroenterology;  Laterality: N/A;  . JOINT REPLACEMENT     (LT) TKR 2017  . lumbar descectomy    . sling procedure    . TOTAL KNEE ARTHROPLASTY Left 03/09/2016   Procedure: TOTAL KNEE ARTHROPLASTY;  Surgeon: Hessie Knows, MD;  Location: [facility] ORS;  Service: Orthopedics;  Laterality: Left;  . TUBAL LIGATION    . tummy tuck      Family History  Problem Relation Age of Onset  . Breast cancer Sister 64  . Graves' disease Sister   . Goiter Sister   . Dementia Mother   . Cancer Father        liver  . Thyroid disease Brother   . Prostate cancer Brother   . Breast cancer Maternal Grandmother   . Dysfunctional uterine bleeding Daughter   . Dementia Maternal Grandfather   . Cancer Paternal Grandmother     Social History   Socioeconomic History  . Marital status: Married    Spouse name: Coralyn Mark   . Number of children: 2  . Years of education: Not on file  . Highest education level: 12th grade  Occupational  History  . Occupation: Estate agent. 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 Financial resource strain: Not hard at all  . Food insecurity:    Worry: Never true    Inability: Never true  . Transportation needs:    Medical: No    Non-medical: No  Tobacco Use  . Smoking status: Never Smoker  . Smokeless tobacco: Never Used  Substance and Sexual Activity  . Alcohol use: Yes    Alcohol/week:  1.0 standard drinks    Types: 1 Glasses of wine per week    Comment: weekly  . Drug use: No  . Sexual activity: Yes    Partners: Male    Birth control/protection: Surgical, Post-menopausal  Lifestyle  . Physical activity:    Days per week: 2 days    Minutes per session: 30 min  . Stress: To some extent  Relationships  . Social connections:    Talks on phone: More than three times a week    Gets together: Twice a week    Attends religious service: More than 4 times per year    Active member of club or organization: No    Attends meetings of clubs or organizations: Never    Relationship status: Married  . Intimate partner violence:    Fear of current or ex partner: No    Emotionally abused: No    Physically abused: No    Forced sexual activity: No  Other Topics Concern  . Not on file  Social History Narrative   Husband is a Engineer, agricultural    She has 3 grandson.      Current Outpatient Medications:  .  albuterol (PROVENTIL HFA) 108 (90 Base) MCG/ACT inhaler, Inhale into the lungs., Disp: , Rfl:  .  Ascorbic Acid (VITAMIN C PO), Take 1,000 mg by mouth daily., Disp: , Rfl:  .  aspirin 81 MG tablet, Take 81 mg by mouth daily., Disp: , Rfl:  .  Cholecalciferol (VITAMIN D-3 PO), Take 5,000 Units by mouth daily., Disp: , Rfl:  .  diltiazem (CARDIZEM CD) 120 MG 24 hr capsule, Take 1 capsule (120 mg total) by mouth daily., Disp: 30 capsule, Rfl: 12 .  fluticasone (FLONASE) 50 MCG/ACT nasal spray, Place 2 sprays into both nostrils daily., Disp: 16 g, Rfl: 0 .  ibandronate  (BONIVA) 150 MG tablet, TAKE ONE TABLET BY MOUTH EVERY 30 DAYS, Disp: 1 tablet, Rfl: 0 .  ibuprofen (ADVIL,MOTRIN) 200 MG tablet, Take by mouth., Disp: , Rfl:  .  Misc Natural Products (OSTEO BI-FLEX JOINT SHIELD PO), Take 1 tablet by mouth daily., Disp: , Rfl:  .  Omega-3 Krill Oil 300 MG CAPS, Take 1 tablet by mouth daily., Disp: , Rfl:  .  pantoprazole (PROTONIX) 40 MG tablet, Take by mouth., Disp: , Rfl:  .  polyethylene glycol powder (GLYCOLAX/MIRALAX) powder, Take 17 g by mouth daily., Disp: 850 g, Rfl: 5 .  SUMAtriptan (IMITREX) 100 MG tablet, Take 1 tablet (100 mg total) by mouth as needed., Disp: 9 tablet, Rfl: 0 .  venlafaxine XR (EFFEXOR-XR) 75 MG 24 hr capsule, Take 1 capsule (75 mg total) by mouth daily with breakfast., Disp: 90 capsule, Rfl: 0 .  vitamin B-12 (CYANOCOBALAMIN) 1000 MCG tablet, Take by mouth., Disp: , Rfl:  .  loratadine (CLARITIN) 10 MG tablet, Take 1 tablet (10 mg total) by mouth daily. (Patient not taking: Reported on 02/12/2019), Disp: 30 tablet, Rfl: 0  No Known Allergies   ROS  Constitutional: Negative for fever , positive for mild  weight change.  Respiratory: Negative for cough and shortness of breath.   Cardiovascular: Negative for chest pain or palpitations.  Gastrointestinal: Negative for abdominal pain, no bowel changes.  Musculoskeletal: Negative for gait problem or joint swelling.  Skin: Negative for rash.  Neurological: Positive for intermittent dizziness and  headache.  No other specific complaints in a complete review of systems (except as listed in HPI above).  Objective  Vitals:   02/12/19  0948  BP: 110/64  Pulse: 83  Resp: 18  Temp: 98.1 F (36.7 C)  TempSrc: Oral  SpO2: 99%  Weight: 147 lb 8 oz (66.9 kg)  Height: 5' 2"  (1.575 m)    Body mass index is 26.98 kg/m.  Physical Exam  Constitutional: Patient appears well-developed and well-nourished. No distress.  HENT: Head: Normocephalic and atraumatic. Ears: B TMs ok, no  erythema or effusion; Nose: Nose normal. Mouth/Throat: Oropharynx is clear and moist. No oropharyngeal exudate.  Eyes: Conjunctivae and EOM are normal. Pupils are equal, round, and reactive to light. No scleral icterus.  Neck: Normal range of motion. Neck supple. No JVD present. No thyromegaly present.  Cardiovascular: Normal rate, regular rhythm and normal heart sounds.  No murmur heard. No BLE edema. Pulmonary/Chest: Effort normal and breath sounds normal. No respiratory distress. Abdominal: Soft. Bowel sounds are normal, no distension. There is no tenderness. no masses Breast: no lumps or masses, no nipple discharge or rashes FEMALE GENITALIA:  External genitalia normal External urethra normal Vaginal vault normal without discharge or lesions Cervix normal without discharge or lesions Bimanual exam normal without masses RECTAL: not done  Musculoskeletal: Normal range of motion, no joint effusions. No gross deformities Neurological: he is alert and oriented to person, place, and time. No cranial nerve deficit. Coordination, balance, strength, speech and gait are normal.  Skin: Skin is warm and dry. No rash noted. No erythema.  Psychiatric: Patient has a normal mood and affect. behavior is normal. Judgment and thought content normal.  PHQ2/9: Depression screen [facility] 2/9 02/12/2019 12/26/2018 11/29/2018 10/03/2018 08/16/2018  Decreased Interest 0 0 0 0 0  Down, Depressed, Hopeless 0 0 0 1 0  PHQ - 2 Score 0 0 0 1 0  Altered sleeping 1 0 1 3 0  Tired, decreased energy 1 0 1 3 1   Change in appetite 0 0 0 0 0  Feeling bad or failure about yourself  0 0 0 0 0  Trouble concentrating 0 0 0 0 0  Moving slowly or fidgety/restless 0 0 0 0 0  Suicidal thoughts 0 0 0 0 0  PHQ-9 Score 2 0 2 7 1   Difficult doing work/chores Not difficult at all - - Not difficult at all Not difficult at all   GAD 7 : Generalized Anxiety Score 02/12/2019 12/26/2018 10/03/2018  Nervous, Anxious, on Edge 1 0 3   Control/stop worrying 1 0 0  Worry too much - different things 1 0 0  Trouble relaxing 1 0 1  Restless 0 0 0  Easily annoyed or irritable 0 0 0  Afraid - awful might happen 0 0 0  Total GAD 7 Score 4 0 4  Anxiety Difficulty Not difficult at all Not difficult at all Not difficult at all     Fall Risk: Fall Risk  02/12/2019 12/26/2018 11/29/2018 10/03/2018 08/16/2018  Falls in the past year? 0 0 0 No No  Number falls in past yr: 0 0 0 - -  Injury with Fall? 0 0 - - -     Functional Status Survey: Is the patient deaf or have difficulty hearing?: No Does the patient have difficulty seeing, even when wearing glasses/contacts?: No Does the patient have difficulty concentrating, remembering, or making decisions?: No Does the patient have difficulty walking or climbing stairs?: No Does the patient have difficulty dressing or bathing?: No Does the patient have difficulty doing errands alone such as visiting a doctor's office or shopping?: No   Assessment &  Plan  1. Well woman exam   -USPSTF grade A and B recommendations reviewed with patient; age-appropriate recommendations, preventive care, screening tests, etc discussed and encouraged; healthy living encouraged; see AVS for patient education given to patient -Discussed importance of 150 minutes of physical activity weekly, eat two servings of fish weekly, eat one serving of tree nuts ( cashews, pistachios, pecans, almonds.Marland Kitchen) every other day, eat 6 servings of fruit/vegetables daily and drink plenty of water and avoid sweet beverages.

## 2019-02-12 NOTE — Patient Instructions (Signed)
Preventive Care 69 Years and Older, Female Preventive care refers to lifestyle choices and visits with your health care provider that can promote health and wellness. What does preventive care include?  A yearly physical exam. This is also called an annual well check.  Dental exams once or twice a year.  Routine eye exams. Ask your health care provider how often you should have your eyes checked.  Personal lifestyle choices, including: ? Daily care of your teeth and gums. ? Regular physical activity. ? Eating a healthy diet. ? Avoiding tobacco and drug use. ? Limiting alcohol use. ? Practicing safe sex. ? Taking low-dose aspirin every day. ? Taking vitamin and mineral supplements as recommended by your health care provider. What happens during an annual well check? The services and screenings done by your health care provider during your annual well check will depend on your age, overall health, lifestyle risk factors, and family history of disease. Counseling Your health care provider may ask you questions about your:  Alcohol use.  Tobacco use.  Drug use.  Emotional well-being.  Home and relationship well-being.  Sexual activity.  Eating habits.  History of falls.  Memory and ability to understand (cognition).  Work and work Statistician.  Reproductive health.  Screening You may have the following tests or measurements:  Height, weight, and BMI.  Blood pressure.  Lipid and cholesterol levels. These may be checked every 5 years, or more frequently if you are over 30 years old.  Skin check.  Lung cancer screening. You may have this screening every year starting at age 27 if you have a 30-pack-year history of smoking and currently smoke or have quit within the past 15 years.  Colorectal cancer screening. All adults should have this screening starting at age 33 and continuing until age 46. You will have tests every 1-10 years, depending on your results and the  type of screening test. People at increased risk should start screening at an earlier age. Screening tests may include: ? Guaiac-based fecal occult blood testing. ? Fecal immunochemical test (FIT). ? Stool DNA test. ? Virtual colonoscopy. ? Sigmoidoscopy. During this test, a flexible tube with a tiny camera (sigmoidoscope) is used to examine your rectum and lower colon. The sigmoidoscope is inserted through your anus into your rectum and lower colon. ? Colonoscopy. During this test, a long, thin, flexible tube with a tiny camera (colonoscope) is used to examine your entire colon and rectum.  Hepatitis C blood test.  Hepatitis B blood test.  Sexually transmitted disease (STD) testing.  Diabetes screening. This is done by checking your blood sugar (glucose) after you have not eaten for a while (fasting). You may have this done every 1-3 years.  Bone density scan. This is done to screen for osteoporosis. You may have this done starting at age 37.  Mammogram. This may be done every 1-2 years. Talk to your health care provider about how often you should have regular mammograms. Talk with your health care provider about your test results, treatment options, and if necessary, the need for more tests. Vaccines Your health care provider may recommend certain vaccines, such as:  Influenza vaccine. This is recommended every year.  Tetanus, diphtheria, and acellular pertussis (Tdap, Td) vaccine. You may need a Td booster every 10 years.  Varicella vaccine. You may need this if you have not been vaccinated.  Zoster vaccine. You may need this after age 38.  Measles, mumps, and rubella (MMR) vaccine. You may need at least  one dose of MMR if you were born in 1957 or later. You may also need a second dose.  Pneumococcal 13-valent conjugate (PCV13) vaccine. One dose is recommended after age 24.  Pneumococcal polysaccharide (PPSV23) vaccine. One dose is recommended after age 24.  Meningococcal  vaccine. You may need this if you have certain conditions.  Hepatitis A vaccine. You may need this if you have certain conditions or if you travel or work in places where you may be exposed to hepatitis A.  Hepatitis B vaccine. You may need this if you have certain conditions or if you travel or work in places where you may be exposed to hepatitis B.  Haemophilus influenzae type b (Hib) vaccine. You may need this if you have certain conditions. Talk to your health care provider about which screenings and vaccines you need and how often you need them. This information is not intended to replace advice given to you by your health care provider. Make sure you discuss any questions you have with your health care provider. Document Released: 12/17/2015 Document Revised: 01/10/2018 Document Reviewed: 09/21/2015 Elsevier Interactive Patient Education  2019 [employer].

## 2019-02-14 ENCOUNTER — Other Ambulatory Visit: Payer: Self-pay

## 2019-02-14 ENCOUNTER — Ambulatory Visit: Payer: Medicare HMO | Admitting: Family Medicine

## 2019-02-14 ENCOUNTER — Encounter: Payer: Self-pay | Admitting: Family Medicine

## 2019-02-14 VITALS — BP 130/82 | HR 76 | Temp 98.1°F | Resp 16 | Ht 62.0 in | Wt 146.5 lb

## 2019-02-14 DIAGNOSIS — R110 Nausea: Secondary | ICD-10-CM

## 2019-02-14 DIAGNOSIS — R232 Flushing: Secondary | ICD-10-CM

## 2019-02-14 DIAGNOSIS — J208 Acute bronchitis due to other specified organisms: Secondary | ICD-10-CM | POA: Diagnosis not present

## 2019-02-14 MED ORDER — BENZONATATE 100 MG PO CAPS: 100.0000 mg | ORAL_CAPSULE | Freq: Two times a day (BID) | ORAL | 1 refills | Status: DC | PRN

## 2019-02-14 NOTE — Patient Instructions (Signed)
Carcinoid Tumors A carcinoid tumor is a rare type of cancer that usually grows very slowly. These tumors can grow in any area of the body where there are cells that receive signals from the nervous system (neuroendocrine cells). Carcinoid tumors are also called neuroendocrine tumors. Some of these tumors may secrete chemical messengers (hormones). The small intestine is the most common place for carcinoid tumors to grow. They may also grow in other areas of the digestive system or in the lungs. There are three types of carcinoid tumors:  Slow-growing tumors are the most common type. This type of tumor may not cause symptoms for many years.  Faster-growing carcinoid tumors may spread to other areas of the body, including the liver. This type causes symptoms sooner.  Hormone-secreting tumors cause symptoms because of the hormones they secrete. These symptoms are called carcinoid syndrome. What are the causes? Cancer results when healthy cells change and start to grow out of control. These cells form tumors. Exactly why this happens is not known. What increases the risk? You are more likely to develop this condition if:  You are 95-24 years old.  You are female.  You are African American.  You have a family history of a condition called multiple endocrine neoplasia type 1 (MEN 1).  You have a stomach condition that reduces the production of stomach acid, such as pernicious anemia, atrophic gastritis, or Zollinger-Ellison syndrome.  You are a smoker. What are the signs or symptoms? Slow-growing carcinoid tumors do not cause symptoms for many years. When symptoms do develop, they depend on the location of the tumor and whether the tumor secretes hormones that cause carcinoid syndrome. In some cases, the symptoms of carcinoid syndrome can be triggered by stress, exercise, alcohol (especially red wine), and some foods (especially cheese and chocolate). Symptoms of carcinoid syndrome may include:   Flushing of the face and neck.  Recurrent diarrhea.  Shortness of breath or wheezing.  Fast or irregular heartbeats (palpitations).  Swelling of the feet and ankles.  Fainting. Symptoms of digestive system carcinoids may include:  Belly pain.  Diarrhea or constipation.  Inability to have a bowel movement.  Nausea or vomiting.  Rectal bleeding. Symptoms of lung carcinoids may include:  Chest pain.  Shortness of breath.  Cough, sometimes with blood. How is this diagnosed? Carcinoid tumors that do not cause symptoms may be found during imaging tests or procedures done to diagnose other conditions. If your health care provider suspects a carcinoid tumor, you may have tests to confirm the diagnosis. These may include:  A 24-hour urine sample.  Blood tests.  A type of imaging scan that is done after you get an injection of a radioactive dye that will go to a carcinoid tumor (octreotide scan).  Other imaging tests, such as an X-ray, MRI, PET, or CT scan.  A procedure to remove a sample of the tumor (biopsy) for testing. How is this treated? Treatment depends on the size and location of the tumor.  For a small tumor that has not spread to other areas of the body, surgery can be done to remove the tumor. This is the most common treatment and usually cures this type of cancer.  For tumors that are too big to remove or have spread to other parts of the body, treatment options may include: ? Surgically removing part of the tumor to make other treatments more effective. ? Taking medicines such as:  A medicine made from a naturally occurring hormone (octreotide). Injections of  octreotide may reduce symptoms and reverse tumor growth.  A medicine that boosts the immune system (interferon). This medicine may be used to increase natural resistance to cancer.  Several types of cancer drugs (chemotherapy). These may be given through an IV or taken by mouth in various  combinations.  Targeted therapy. This involves using drugs that target specific parts of cancer cells and the area around them to block the growth and spread of the cancer. ? Using high-energy rays to kill cancer cells (radiation therapy). This may be used for some carcinoid tumors.  For carcinoid cancer that has spread to the liver, treatment may include having a procedure that involves using radio waves that destroy cancer, either with heat (radiofrequency ablation) or with freezing (cryoablation). Pellets may be injected to block blood supply to the tumor (embolization). Follow these instructions at home:      If you had surgery, follow all your home care instructions as told by your health care provider.  Take over-the-counter and prescription medicines only as told by your health care provider.  Follow diet instructions from your health care provider. This may include adding more protein to your diet. You may also need to avoid any foods that trigger your symptoms.  Do not drink alcohol, especially red wine.  Do not use any products that contain nicotine or tobacco, such as cigarettes, e-cigarettes, and chewing tobacco. If you need help quitting, ask your health care provider.  Take actions to manage stress, such as doing meditation or deep breathing techniques.  Keep all follow-up visits as told by your health care provider. This is important. Contact a health care provider if you:  Develop any symptoms of carcinoid syndrome.  Develop any new symptoms. Get help right away if you have:  Chest pain.  Trouble breathing. Summary  A carcinoid tumor is a rare type of cancer that usually grows very slowly.  Slow-growing carcinoid tumors do not cause symptoms for many years. When symptoms do occur, they depend on the location of the tumor and whether the tumor secretes hormones that cause carcinoid syndrome.  Carcinoid syndrome may cause flushing, wheezing, palpitations,  diarrhea, swelling, and fainting.  You may need blood tests, imaging tests, and a biopsy to confirm a diagnosis of carcinoid tumors.  Surgery is the best treatment for a carcinoid tumor when it is still small. There are several other treatment options for tumors that are large or have spread to other areas of the body. This information is not intended to replace advice given to you by your health care provider. Make sure you discuss any questions you have with your health care provider. Document Released: 07/25/2018 Document Revised: 07/25/2018 Document Reviewed: 07/25/2018 Elsevier Interactive Patient Education  2019 [employer].

## 2019-02-14 NOTE — Progress Notes (Signed)
Name: Bonnie Owens   MRN: [id number]    DOB: 10/15/1951   Date:02/14/2019       Progress Note  Subjective  Chief Complaint  Chief Complaint  Patient presents with  . Headache  . Hot Flashes  . Medication Refill    HPI  Hot flashes: she noticed flushing described as hot sensation from the chest up to her face, and she gets red and pours sweat on her face. The warning side is nausea a few minutes prior to episode. Not associated with vomiting or diarrhea. No fever She had multiple tests done including 5HIAA done in September that were negative and was started on Effexor and improved symptoms slightly , decrease the anxiety component, however continues to have episodes daily and is concerning her . She states it also makes her feel tired. She is not sleeping well - but that is her baseline.   Pelvic fullness: she has noticed right lower quadrant fullness for months, it is intermittent not associated with change in bowel movements of bladder problems. She has a history of hysterectomy at age 51 and states right ovary removed - complicated surgery, but has left ovary, we will make sure by getting pelvic US to also look into appendix if possible  Productive cough: she states symptoms started one week ago. Initially frontal headache, nasal congestion, sinus pressure, followed by post-nasal drainage and over the past couple of days has noticed productive cough, no wheezing or SOB. She has been taking sudafed otc and seems to help . No fever, chills or recent travel.    Patient Active Problem List   Diagnosis Date Noted  . Gastritis 11/26/2018  . Hiatal hernia 11/26/2018  . Flushing 09/04/2018  . Palpitations 03/19/2017  . History of basal cell carcinoma 11/20/2016  . Primary osteoarthritis of left knee 03/09/2016  . History of total knee replacement, left 03/09/2016  . Osteopenia 11/17/2015  . Migraine without aura and without status migrainosus, not intractable 11/17/2015  . Urge  urinary incontinence 11/17/2015  . Dyslipidemia 11/17/2015  . Large breasts 11/17/2015  . Menopause 11/17/2015  . Vitamin D deficiency 11/17/2015  . MVP (mitral valve prolapse) 11/17/2015  . History of lumbar fusion 11/17/2015  . Chronic constipation 11/17/2015  . Venous insufficiency 11/17/2015  . GERD (gastroesophageal reflux disease) 11/17/2015  . Eustachian tube dysfunction 11/17/2015  . Sciatica of left side     Past Surgical History:  Procedure Laterality Date  . ABDOMINAL HYSTERECTOMY    . APPENDECTOMY    . BASAL CELL CARCINOMA EXCISION Left 10/2016   Underneath Left Eye- Green Skin- Dr. Nehemiah Massed  . COLONOSCOPY N/A 11/14/2017   Procedure: COLONOSCOPY;  Surgeon: Toledo, Benay Pike, MD;  Location: [facility];  Service: Endoscopy;  Laterality: N/A;  . ESOPHAGOGASTRODUODENOSCOPY (EGD) WITH PROPOFOL N/A 11/20/2018   Procedure: ESOPHAGOGASTRODUODENOSCOPY (EGD) WITH PROPOFOL;  Surgeon: Toledo, Benay Pike, MD;  Location: [facility] ENDOSCOPY;  Service: Gastroenterology;  Laterality: N/A;  . JOINT REPLACEMENT     (LT) TKR 2017  . lumbar descectomy    . sling procedure    . TOTAL KNEE ARTHROPLASTY Left 03/09/2016   Procedure: TOTAL KNEE ARTHROPLASTY;  Surgeon: Hessie Knows, MD;  Location: [facility] ORS;  Service: Orthopedics;  Laterality: Left;  . TUBAL LIGATION    . tummy tuck      Family History  Problem Relation Age of Onset  . Breast cancer Sister 38  . Graves' disease Sister   . Goiter Sister   . Dementia Mother   .  Cancer Father        liver  . Thyroid disease Brother   . Prostate cancer Brother   . Breast cancer Maternal Grandmother   . Dysfunctional uterine bleeding Daughter   . Dementia Maternal Grandfather   . Cancer Paternal Grandmother     Social History   Socioeconomic History  . Marital status: Married    Spouse name: Coralyn Mark   . Number of children: 2  . Years of education: Not on file  . Highest education level: 12th grade  Occupational History  .  Occupation: Estate agent. 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 Financial resource strain: Not hard at all  . Food insecurity:    Worry: Never true    Inability: Never true  . Transportation needs:    Medical: No    Non-medical: No  Tobacco Use  . Smoking status: Never Smoker  . Smokeless tobacco: Never Used  Substance and Sexual Activity  . Alcohol use: Yes    Alcohol/week: 1.0 standard drinks    Types: 1 Glasses of wine per week    Comment: weekly  . Drug use: No  . Sexual activity: Yes    Partners: Male    Birth control/protection: Surgical, Post-menopausal  Lifestyle  . Physical activity:    Days per week: 2 days    Minutes per session: 30 min  . Stress: To some extent  Relationships  . Social connections:    Talks on phone: More than three times a week    Gets together: Twice a week    Attends religious service: More than 4 times per year    Active member of club or organization: No    Attends meetings of clubs or organizations: Never    Relationship status: Married  . Intimate partner violence:    Fear of current or ex partner: No    Emotionally abused: No    Physically abused: No    Forced sexual activity: No  Other Topics Concern  . Not on file  Social History Narrative   Husband is a Engineer, agricultural    She has 3 grandson.      Current Outpatient Medications:  .  albuterol (PROVENTIL HFA) 108 (90 Base) MCG/ACT inhaler, Inhale into the lungs., Disp: , Rfl:  .  Ascorbic Acid (VITAMIN C PO), Take 1,000 mg by mouth daily., Disp: , Rfl:  .  aspirin 81 MG tablet, Take 81 mg by mouth daily., Disp: , Rfl:  .  Cholecalciferol (VITAMIN D-3 PO), Take 5,000 Units by mouth daily., Disp: , Rfl:  .  diltiazem (CARDIZEM CD) 120 MG 24 hr capsule, Take 1 capsule (120 mg total) by mouth daily., Disp: 30 capsule, Rfl: 12 .  fluticasone (FLONASE) 50 MCG/ACT nasal spray, Place 2 sprays into both nostrils daily., Disp: 16 g, Rfl: 0 .  ibandronate (BONIVA) 150 MG  tablet, TAKE ONE TABLET BY MOUTH EVERY 30 DAYS, Disp: 1 tablet, Rfl: 0 .  ibuprofen (ADVIL,MOTRIN) 200 MG tablet, Take by mouth., Disp: , Rfl:  .  loratadine (CLARITIN) 10 MG tablet, Take 1 tablet (10 mg total) by mouth daily. (Patient not taking: Reported on 02/12/2019), Disp: 30 tablet, Rfl: 0 .  Misc Natural Products (OSTEO BI-FLEX JOINT SHIELD PO), Take 1 tablet by mouth daily., Disp: , Rfl:  .  Omega-3 Krill Oil 300 MG CAPS, Take 1 tablet by mouth daily., Disp: , Rfl:  .  pantoprazole (PROTONIX) 40 MG tablet, Take by  mouth., Disp: , Rfl:  .  polyethylene glycol powder (GLYCOLAX/MIRALAX) powder, Take 17 g by mouth daily., Disp: 850 g, Rfl: 5 .  SUMAtriptan (IMITREX) 100 MG tablet, Take 1 tablet (100 mg total) by mouth as needed., Disp: 9 tablet, Rfl: 0 .  venlafaxine XR (EFFEXOR-XR) 75 MG 24 hr capsule, Take 1 capsule (75 mg total) by mouth daily with breakfast., Disp: 90 capsule, Rfl: 0 .  vitamin B-12 (CYANOCOBALAMIN) 1000 MCG tablet, Take by mouth., Disp: , Rfl:   No Known Allergies  I personally reviewed active problem list, medication list, allergies, family history, social history with the patient/caregiver today.   ROS  Constitutional: Negative for fever or weight change.  Respiratory: Negative for cough and shortness of breath.   Cardiovascular: Negative for chest pain or palpitations.  Gastrointestinal: Negative for abdominal pain, no bowel changes.  Musculoskeletal: Negative for gait problem or joint swelling.  Skin: Negative for rash.  Neurological: Negative for dizziness or headache.  No other specific complaints in a complete review of systems (except as listed in HPI above).  Objective  Vitals:   02/14/19 0819  BP: 130/82  Pulse: 76  Resp: 16  Temp: 98.1 F (36.7 C)  TempSrc: Oral  SpO2: 98%  Weight: 146 lb 8 oz (66.5 kg)  Height: 5\' 2"  (1.575 m)    Body mass index is 26.8 kg/m.  Physical Exam  Constitutional: Patient appears well-developed and  well-nourished. Obese  No distress.  HEENT: head atraumatic, normocephalic, pupils equal and reactive to light, ears TM , neck supple, throat within normal limits Cardiovascular: Normal rate, regular rhythm and normal heart sounds.  No murmur heard. No BLE edema. Pulmonary/Chest: Effort normal  No respiratory distress. Rhonchi on auscultation  Abdominal: Soft.  There is no tenderness. Psychiatric: Patient has a normal mood and affect. behavior is normal. Judgment and thought content normal.  PHQ2/9: Depression screen [facility] 2/9 02/14/2019 02/12/2019 12/26/2018 11/29/2018 10/03/2018  Decreased Interest 0 0 0 0 0  Down, Depressed, Hopeless 0 0 0 0 1  PHQ - 2 Score 0 0 0 0 1  Altered sleeping 0 1 0 1 3  Tired, decreased energy 0 1 0 1 3  Change in appetite 0 0 0 0 0  Feeling bad or failure about yourself  0 0 0 0 0  Trouble concentrating 0 0 0 0 0  Moving slowly or fidgety/restless 0 0 0 0 0  Suicidal thoughts 0 0 0 0 0  PHQ-9 Score 0 2 0 2 7  Difficult doing work/chores - Not difficult at all - - Not difficult at all     Fall Risk: Fall Risk  02/14/2019 02/12/2019 12/26/2018 11/29/2018 10/03/2018  Falls in the past year? 0 0 0 0 No  Number falls in past yr: 0 0 0 0 -  Injury with Fall? 0 0 0 - -    Assessment & Plan  1. Flushing  - FSH/LH - Estrogens, total - US PELVIC COMPLETE WITH TRANSVAGINAL; Future  2. Nausea  Discussed carcinoid syndrome   3. Viral bronchitis  Symptoms control for now, call back on Monday if getting worse or no improvement  Also gave counseling about Covid-19

## 2019-02-19 LAB — ESTROGENS, TOTAL: Estrogen: 94.6 pg/mL

## 2019-02-19 LAB — FSH/LH
FSH: 76.7 m[IU]/mL
LH: 48 m[IU]/mL

## 2019-02-26 ENCOUNTER — Ambulatory Visit
Admission: RE | Admit: 2019-02-26 | Discharge: 2019-02-26 | Disposition: A | Discharge status: Home / Self Care | Payer: Medicare HMO | Source: Ambulatory Visit | Attending: Family Medicine | Admitting: Family Medicine

## 2019-02-26 ENCOUNTER — Other Ambulatory Visit: Payer: Self-pay

## 2019-02-26 DIAGNOSIS — R232 Flushing: Secondary | ICD-10-CM | POA: Insufficient documentation

## 2019-02-26 DIAGNOSIS — R1900 Intra-abdominal and pelvic swelling, mass and lump, unspecified site: Secondary | ICD-10-CM | POA: Diagnosis not present

## 2019-02-28 ENCOUNTER — Telehealth: Payer: Self-pay

## 2019-02-28 ENCOUNTER — Other Ambulatory Visit: Payer: Self-pay | Admitting: Family Medicine

## 2019-02-28 DIAGNOSIS — R140 Abdominal distension (gaseous): Secondary | ICD-10-CM

## 2019-02-28 DIAGNOSIS — R110 Nausea: Secondary | ICD-10-CM

## 2019-02-28 DIAGNOSIS — R232 Flushing: Secondary | ICD-10-CM

## 2019-02-28 NOTE — Telephone Encounter (Signed)
Copied from [place] [id number]. Topic: Referral - Request for Referral >> Feb 28, 2019 10:27 AM Reyne Dumas L wrote: Has patient seen PCP for this complaint? yes *If NO, is insurance requiring patient see PCP for this issue before PCP can refer them? Referral for which specialty: imaging Preferred provider/office: unknown Reason for referral: CT scan.    Pt called and left voicemail on [facility] mailbox.  States that she received a call about ultrasounds results and she does want to go ahead with CT scan.  Pt can be reached at [phone]

## 2019-02-28 NOTE — Telephone Encounter (Signed)
Please order the CT scan for this patient.

## 2019-02-28 NOTE — Progress Notes (Unsigned)
ct 

## 2019-02-28 NOTE — Telephone Encounter (Signed)
Already ordered

## 2019-03-06 DIAGNOSIS — H6980 Other specified disorders of Eustachian tube, unspecified ear: Secondary | ICD-10-CM | POA: Diagnosis not present

## 2019-03-06 DIAGNOSIS — J019 Acute sinusitis, unspecified: Secondary | ICD-10-CM | POA: Diagnosis not present

## 2019-03-11 ENCOUNTER — Other Ambulatory Visit: Payer: Self-pay

## 2019-03-11 ENCOUNTER — Ambulatory Visit
Admission: RE | Admit: 2019-03-11 | Discharge: 2019-03-11 | Disposition: A | Discharge status: Home / Self Care | Payer: Medicare HMO | Source: Ambulatory Visit | Attending: Family Medicine | Admitting: Family Medicine

## 2019-03-11 DIAGNOSIS — M899 Disorder of bone, unspecified: Secondary | ICD-10-CM | POA: Diagnosis not present

## 2019-03-11 DIAGNOSIS — D3000 Benign neoplasm of unspecified kidney: Secondary | ICD-10-CM | POA: Diagnosis not present

## 2019-03-11 DIAGNOSIS — D1771 Benign lipomatous neoplasm of kidney: Secondary | ICD-10-CM | POA: Diagnosis not present

## 2019-03-11 DIAGNOSIS — R140 Abdominal distension (gaseous): Secondary | ICD-10-CM | POA: Insufficient documentation

## 2019-03-11 DIAGNOSIS — K5730 Diverticulosis of large intestine without perforation or abscess without bleeding: Secondary | ICD-10-CM | POA: Diagnosis not present

## 2019-03-11 LAB — POCT I-STAT CREATININE: Creatinine, Ser: 0.7 mg/dL (ref 0.44–1.00)

## 2019-03-11 MED ORDER — IOHEXOL 300 MG/ML  SOLN
100.0000 mL | Freq: Once | INTRAMUSCULAR | Status: AC | PRN
  Administered 2019-03-11: 85 mL via INTRAVENOUS

## 2019-03-18 DIAGNOSIS — R232 Flushing: Secondary | ICD-10-CM | POA: Diagnosis not present

## 2019-03-21 ENCOUNTER — Other Ambulatory Visit (HOSPITAL_COMMUNITY): Payer: Self-pay | Admitting: Unknown Physician Specialty

## 2019-03-21 ENCOUNTER — Other Ambulatory Visit: Payer: Self-pay | Admitting: Unknown Physician Specialty

## 2019-03-21 DIAGNOSIS — H903 Sensorineural hearing loss, bilateral: Secondary | ICD-10-CM | POA: Diagnosis not present

## 2019-03-21 DIAGNOSIS — R519 Headache, unspecified: Secondary | ICD-10-CM

## 2019-03-21 DIAGNOSIS — R51 Headache: Principal | ICD-10-CM

## 2019-03-31 ENCOUNTER — Ambulatory Visit
Admission: RE | Admit: 2019-03-31 | Discharge: 2019-03-31 | Disposition: A | Discharge status: Home / Self Care | Payer: Medicare HMO | Source: Ambulatory Visit | Attending: Unknown Physician Specialty | Admitting: Unknown Physician Specialty

## 2019-03-31 ENCOUNTER — Other Ambulatory Visit: Payer: Self-pay

## 2019-03-31 DIAGNOSIS — R519 Headache, unspecified: Secondary | ICD-10-CM

## 2019-03-31 DIAGNOSIS — R232 Flushing: Secondary | ICD-10-CM | POA: Diagnosis not present

## 2019-03-31 DIAGNOSIS — R1031 Right lower quadrant pain: Secondary | ICD-10-CM | POA: Diagnosis not present

## 2019-03-31 DIAGNOSIS — R51 Headache: Secondary | ICD-10-CM | POA: Insufficient documentation

## 2019-03-31 DIAGNOSIS — H938X2 Other specified disorders of left ear: Secondary | ICD-10-CM | POA: Diagnosis not present

## 2019-03-31 MED ORDER — GADOBUTROL 1 MMOL/ML IV SOLN
6.0000 mL | Freq: Once | INTRAVENOUS | Status: AC | PRN
  Administered 2019-03-31: 6 mL via INTRAVENOUS

## 2019-04-07 ENCOUNTER — Other Ambulatory Visit: Payer: Self-pay | Admitting: Family Medicine

## 2019-04-07 DIAGNOSIS — K5909 Other constipation: Secondary | ICD-10-CM

## 2019-04-15 DIAGNOSIS — G44009 Cluster headache syndrome, unspecified, not intractable: Secondary | ICD-10-CM | POA: Diagnosis not present

## 2019-04-15 DIAGNOSIS — R232 Flushing: Secondary | ICD-10-CM | POA: Diagnosis not present

## 2019-04-15 DIAGNOSIS — R110 Nausea: Secondary | ICD-10-CM | POA: Diagnosis not present

## 2019-04-29 ENCOUNTER — Other Ambulatory Visit: Payer: Self-pay | Admitting: Family Medicine

## 2019-06-03 ENCOUNTER — Telehealth: Payer: Self-pay

## 2019-06-03 DIAGNOSIS — K469 Unspecified abdominal hernia without obstruction or gangrene: Secondary | ICD-10-CM

## 2019-06-03 DIAGNOSIS — K2980 Duodenitis without bleeding: Secondary | ICD-10-CM

## 2019-06-03 DIAGNOSIS — R140 Abdominal distension (gaseous): Secondary | ICD-10-CM

## 2019-06-03 DIAGNOSIS — R232 Flushing: Secondary | ICD-10-CM

## 2019-06-03 NOTE — Telephone Encounter (Signed)
Patient states she is experiencing increase in tenderness of RLQ in abdomen with pain of 5/10. She was seen by [facility] in May. She is still having issues with constipation and takes miralax PRN. Pt wants to know if she needs to be seen by Dr. Ancil Boozer or if she thinks anything else could be going on. Pt is concerned that there is something else wrong and it's not being seen on CT scan of abdomen that was done 03/11/19. Please advise. Thank you!

## 2019-06-04 ENCOUNTER — Other Ambulatory Visit: Payer: Self-pay | Admitting: Family Medicine

## 2019-06-04 DIAGNOSIS — G43009 Migraine without aura, not intractable, without status migrainosus: Secondary | ICD-10-CM

## 2019-06-04 NOTE — Telephone Encounter (Signed)
Refill request for general medication. Imitrex   Last office visit 02/14/2019   Follow up on 08/18/2019

## 2019-06-05 NOTE — Telephone Encounter (Signed)
Dr. Ancil Boozer reviewed patient notes from [facility] Dr. 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 GI suggested a referral to GYN and Endocrinology. Patient was informed if the pain was severe to call GI back and informed them. Per patient she would like to go ahead and see Endo recommended per Dr. Ancil Boozer and GI physicians since her lab results are coming back normal.

## 2019-06-05 NOTE — Telephone Encounter (Signed)
Pt calling to check status. Please advise  °

## 2019-06-10 DIAGNOSIS — H5213 Myopia, bilateral: Secondary | ICD-10-CM | POA: Diagnosis not present

## 2019-06-17 ENCOUNTER — Ambulatory Visit
Admission: RE | Admit: 2019-06-17 | Discharge: 2019-06-17 | Disposition: A | Discharge status: Home / Self Care | Payer: Medicare HMO | Source: Ambulatory Visit | Attending: Gastroenterology | Admitting: Gastroenterology

## 2019-06-17 ENCOUNTER — Other Ambulatory Visit: Payer: Self-pay | Admitting: Gastroenterology

## 2019-06-17 ENCOUNTER — Ambulatory Visit
Admission: RE | Admit: 2019-06-17 | Discharge: 2019-06-17 | Disposition: A | Discharge status: Home / Self Care | Payer: Medicare HMO | Source: Ambulatory Visit | Attending: Family Medicine | Admitting: Family Medicine

## 2019-06-17 DIAGNOSIS — R1031 Right lower quadrant pain: Secondary | ICD-10-CM | POA: Diagnosis not present

## 2019-06-17 DIAGNOSIS — M898X8 Other specified disorders of bone, other site: Secondary | ICD-10-CM | POA: Diagnosis not present

## 2019-06-17 DIAGNOSIS — K5909 Other constipation: Secondary | ICD-10-CM | POA: Diagnosis not present

## 2019-06-17 DIAGNOSIS — R110 Nausea: Secondary | ICD-10-CM | POA: Diagnosis not present

## 2019-06-23 ENCOUNTER — Encounter: Payer: Self-pay | Admitting: Obstetrics and Gynecology

## 2019-06-23 ENCOUNTER — Ambulatory Visit (INDEPENDENT_AMBULATORY_CARE_PROVIDER_SITE_OTHER): Payer: Medicare HMO | Admitting: Obstetrics and Gynecology

## 2019-06-23 ENCOUNTER — Other Ambulatory Visit: Payer: Self-pay

## 2019-06-23 VITALS — BP 102/64 | HR 81 | Ht 62.0 in | Wt 136.0 lb

## 2019-06-23 DIAGNOSIS — T859XXA Unspecified complication of internal prosthetic device, implant and graft, initial encounter: Secondary | ICD-10-CM

## 2019-06-23 DIAGNOSIS — K469 Unspecified abdominal hernia without obstruction or gangrene: Secondary | ICD-10-CM

## 2019-06-23 DIAGNOSIS — R102 Pelvic and perineal pain: Secondary | ICD-10-CM

## 2019-06-23 NOTE — Progress Notes (Signed)
Patient ID: Bonnie Owens, female   DOB: 29-Apr-1951, 68 y.o.   MRN: [id number]  Reason for Consult: Referral (Pain in RLQ, dull throbbing pain )   Referred by Steele Sizer, MD  Subjective:     HPI:  Bonnie Owens is a 68 y.o. female . She presents today with abdominal pain. She has had pain on her right side and a swelling sensation for 3-4 months. She feels the pain through out the day. It usually happens five times a day. It does not usually last longer than 1 hour. She sometimes has hot flashes associated with the pain.  She has been seeing dr. Jacqulyn Liner with GI. She has a colonoscopy 1 year ago and is planning on a repeat. She has struggled with constipation but nor that she is taking miralax it helps her have a bowel movement once every 3 days. It is usually a soft and formed bowel movement.   She has a history of surgery for prolapse and reports that she has a vaginal mesh. She may of also had a bladder support procedure, but she is unsure. This surgery was 8-10 years ago. She can not recall the date or names of the surgeons, but will send me that information when she gets home.   She reports that she had a hysterectomy at the age of 3 for heavy menstrual periods. She says that this surgery was complicated because her bowel and ovary were stuck to her uterus at the time of surgery. She had the surgery for heavy menstrual bleeding and painful periods. She has a history of vaginal births.    Gynecological History Menarche: not asked Menopause: unsure Last pap smear: discontinued Last Mammogram: 07/10/2018 BIRADS1 History of STDs: no  Obstetrical History History of two vaginal births  Past Medical History:  Diagnosis Date  . Anemia   . Anginal pain ([place])   . Chronic constipation   . Complication of anesthesia    Bad headache when waking  . Depression   . Dyspnea   . GERD (gastroesophageal reflux disease)   . Headache    occasional migraine  . Hyperlipidemia   .  Left knee pain   . Mitral valve prolapse   . Osteopenia   . Urine incontinence   . Venous (peripheral) insufficiency   . Vitamin D deficiency    Family History  Problem Relation Age of Onset  . Breast cancer Sister 65  . Graves' disease Sister   . Goiter Sister   . Dementia Mother   . Cancer Father        liver  . Thyroid disease Brother   . Prostate cancer Brother   . Breast cancer Maternal Grandmother   . Dysfunctional uterine bleeding Daughter   . Dementia Maternal Grandfather   . Cancer Paternal Grandmother    Past Surgical History:  Procedure Laterality Date  . ABDOMINAL HYSTERECTOMY  1985  . APPENDECTOMY    . BASAL CELL CARCINOMA EXCISION Left 10/2016   Underneath Left Eye- [place] [facility]- Dr. Nehemiah Massed  . COLONOSCOPY N/A 11/14/2017   Procedure: COLONOSCOPY;  Surgeon: Toledo, Benay Pike, MD;  Location: [facility];  Service: Endoscopy;  Laterality: N/A;  . ESOPHAGOGASTRODUODENOSCOPY (EGD) WITH PROPOFOL N/A 11/20/2018   Procedure: ESOPHAGOGASTRODUODENOSCOPY (EGD) WITH PROPOFOL;  Surgeon: Toledo, Benay Pike, MD;  Location: [facility];  Service: Gastroenterology;  Laterality: N/A;  . JOINT REPLACEMENT     (LT) TKR 2017  . lumbar descectomy    . sling procedure    .  TOTAL KNEE ARTHROPLASTY Left 03/09/2016   Procedure: TOTAL KNEE ARTHROPLASTY;  Surgeon: Hessie Knows, MD;  Location: [facility];  Service: Orthopedics;  Laterality: Left;  . TUBAL LIGATION    . tummy tuck      Short Social History:  Social History   Tobacco Use  . Smoking status: Never Smoker  . Smokeless tobacco: Never Used  Substance Use Topics  . Alcohol use: Yes    Alcohol/week: 1.0 standard drinks    Types: 1 Glasses of wine per week    Comment: weekly    No Known Allergies  Current Outpatient Medications  Medication Sig Dispense Refill  . albuterol (PROVENTIL HFA) 108 (90 Base) MCG/ACT inhaler Inhale into the lungs.    . Ascorbic Acid (VITAMIN C PO) Take 1,000 mg by mouth daily.    Marland Kitchen  aspirin 81 MG tablet Take 81 mg by mouth daily.    . benzonatate (TESSALON) 100 MG capsule Take 1-2 capsules (100-200 mg total) by mouth 2 (two) times daily as needed. 30 capsule 1  . Cholecalciferol (VITAMIN D-3 PO) Take 5,000 Units by mouth daily.    Marland Kitchen diltiazem (CARDIZEM CD) 120 MG 24 hr capsule Take 1 capsule (120 mg total) by mouth daily. 30 capsule 12  . fluticasone (FLONASE) 50 MCG/ACT nasal spray Place 2 sprays into both nostrils daily. 16 g 0  . ibandronate (BONIVA) 150 MG tablet TAKE ONE TABLET BY MOUTH EVERY 30 DAYS 1 tablet 6  . ibuprofen (ADVIL,MOTRIN) 200 MG tablet Take by mouth.    . Misc Natural Products (OSTEO BI-FLEX JOINT SHIELD PO) Take 1 tablet by mouth daily.    . Omega-3 Krill Oil 300 MG CAPS Take 1 tablet by mouth daily.    . polyethylene glycol powder (GLYCOLAX/MIRALAX) 17 GM/SCOOP powder Take 17 g by mouth daily. 527 g 0  . SUMAtriptan (IMITREX) 100 MG tablet Take 1 tablet (100 mg total) by mouth as needed. 9 tablet 0  . vitamin B-12 (CYANOCOBALAMIN) 1000 MCG tablet Take by mouth.     No current facility-administered medications for this visit.     Review of Systems  Constitutional: Negative for chills, fatigue, fever and unexpected weight change.  HENT: Negative for trouble swallowing.  Eyes: Negative for loss of vision.  Respiratory: Negative for cough, shortness of breath and wheezing.  Cardiovascular: Negative for chest pain, leg swelling, palpitations and syncope.  GI: Negative for abdominal pain, blood in stool, diarrhea, nausea and vomiting.  GU: Negative for difficulty urinating, dysuria, frequency and hematuria.  Musculoskeletal: Negative for back pain, leg pain and joint pain.  Skin: Negative for rash.  Neurological: Negative for dizziness, headaches, light-headedness, numbness and seizures.  Psychiatric: Negative for behavioral problem, confusion, depressed mood and sleep disturbance.        Objective:  Objective   Vitals:   06/23/19 1512  BP:  102/64  Pulse: 81  Weight: 136 lb (61.7 kg)  Height: 5\' 2"  (1.575 m)   Body mass index is 24.87 kg/m.  Physical Exam Vitals signs and nursing note reviewed.  Constitutional:      Appearance: She is well-developed.  HENT:     Head: Normocephalic and atraumatic.  Eyes:     Pupils: Pupils are equal, round, and reactive to light.  Cardiovascular:     Rate and Rhythm: Normal rate and regular rhythm.  Pulmonary:     Effort: Pulmonary effort is normal. No respiratory distress.  Abdominal:     General: Abdomen is flat. There is no  distension.     Palpations: Abdomen is soft. There is no mass.     Tenderness: There is abdominal tenderness. There is no guarding or rebound.     Hernia: No hernia is present.  Genitourinary:    Comments: External: Normal appearing vulva. No lesions noted.  Speculum examination: Normal appearing vaginal cuff. Intact. No blood in the vaginal vault. no discharge.  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 Skin:    General: Skin is warm and dry.  Neurological:     Mental Status: She is alert and oriented to person, place, and time.  Psychiatric:        Behavior: Behavior normal.        Thought Content: Thought content normal.        Judgment: Judgment normal.     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  Pain is palpated at the vaginal apex palpated between the vagina and possibly bowel      Assessment/Plan:     68 yo with vague abdominal pain, suspicion for pain secondary to adhesive disease possibly involving mesh  from prolapse repairs.  Will follow up for further consultation after more information is gathered around past surgeries.  Patient may of had a robotic sacral colpopexy. She appears to be tender at the area at the vaginal apex which may represent adhesive disease or bowel which could be adherent to the mesh. Surgical exploration would need to be undertaken with general surgery's involvement.  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  Repair would be needed to be performed by urogynegolocial specialist.    More than 60 minutes were spent face to face with the patient in the room with more than 50% of the time spent providing counseling and discussing the plan of management.     Adrian Prows MD [facility] 06/23/2019 4:08 PM

## 2019-06-24 ENCOUNTER — Other Ambulatory Visit: Payer: Self-pay | Admitting: Family Medicine

## 2019-06-24 DIAGNOSIS — Z1231 Encounter for screening mammogram for malignant neoplasm of breast: Secondary | ICD-10-CM

## 2019-07-07 DIAGNOSIS — M25551 Pain in right hip: Secondary | ICD-10-CM | POA: Diagnosis not present

## 2019-07-07 DIAGNOSIS — S76011A Strain of muscle, fascia and tendon of right hip, initial encounter: Secondary | ICD-10-CM | POA: Insufficient documentation

## 2019-07-15 ENCOUNTER — Ambulatory Visit: Payer: Medicare HMO | Admitting: Obstetrics and Gynecology

## 2019-07-16 ENCOUNTER — Encounter: Payer: Self-pay | Admitting: Obstetrics and Gynecology

## 2019-07-16 ENCOUNTER — Other Ambulatory Visit: Payer: Self-pay

## 2019-07-16 ENCOUNTER — Ambulatory Visit (INDEPENDENT_AMBULATORY_CARE_PROVIDER_SITE_OTHER): Payer: Medicare HMO | Admitting: Obstetrics and Gynecology

## 2019-07-16 VITALS — BP 124/74 | Ht 62.0 in | Wt 135.0 lb

## 2019-07-16 DIAGNOSIS — K469 Unspecified abdominal hernia without obstruction or gangrene: Secondary | ICD-10-CM | POA: Diagnosis not present

## 2019-07-16 DIAGNOSIS — R102 Pelvic and perineal pain: Secondary | ICD-10-CM | POA: Diagnosis not present

## 2019-07-16 NOTE — Progress Notes (Signed)
Patient ID: Bonnie Owens, female   DOB: 26-Aug-1951, 68 y.o.   MRN: [id number]  Reason for Consult: Follow-up   Referred by Steele Sizer, MD  Subjective:     HPI:  Bonnie Owens is a 68 y.o. female. She is following up today for persistent pelvic pain. She has been seen by orthopedics and is waiting for a MRI to be scheduled. She has not had improvement in her symptoms.    Past Medical History:  Diagnosis Date  . Anemia   . Anginal pain ([place])   . Chronic constipation   . Complication of anesthesia    Bad headache when waking  . Depression   . Dyspnea   . GERD (gastroesophageal reflux disease)   . Headache    occasional migraine  . Hyperlipidemia   . Left knee pain   . Mitral valve prolapse   . Osteopenia   . Urine incontinence   . Venous (peripheral) insufficiency   . Vitamin D deficiency    Family History  Problem Relation Age of Onset  . Breast cancer Sister 69  . Graves' disease Sister   . Goiter Sister   . Dementia Mother   . Cancer Father        liver  . Thyroid disease Brother   . Prostate cancer Brother   . Breast cancer Maternal Grandmother   . Dysfunctional uterine bleeding Daughter   . Dementia Maternal Grandfather   . Cancer Paternal Grandmother    Past Surgical History:  Procedure Laterality Date  . ABDOMINAL HYSTERECTOMY  1985  . APPENDECTOMY    . BASAL CELL CARCINOMA EXCISION Left 10/2016   Underneath Left Eye-  Skin- Dr. Nehemiah Massed  . COLONOSCOPY N/A 11/14/2017   Procedure: COLONOSCOPY;  Surgeon: Toledo, Benay Pike, MD;  Location: [facility];  Service: Endoscopy;  Laterality: N/A;  . ESOPHAGOGASTRODUODENOSCOPY (EGD) WITH PROPOFOL N/A 11/20/2018   Procedure: ESOPHAGOGASTRODUODENOSCOPY (EGD) WITH PROPOFOL;  Surgeon: Toledo, Benay Pike, MD;  Location: [facility];  Service: Gastroenterology;  Laterality: N/A;  . JOINT REPLACEMENT     (LT) TKR 2017  . lumbar descectomy    . sling procedure    . TOTAL KNEE ARTHROPLASTY  Left 03/09/2016   Procedure: TOTAL KNEE ARTHROPLASTY;  Surgeon: Hessie Knows, MD;  Location: [facility];  Service: Orthopedics;  Laterality: Left;  . TUBAL LIGATION    . tummy tuck      Short Social History:  Social History   Tobacco Use  . Smoking status: Never Smoker  . Smokeless tobacco: Never Used  Substance Use Topics  . Alcohol use: Yes    Alcohol/week: 1.0 standard drinks    Types: 1 Glasses of wine per week    Comment: weekly    No Known Allergies  Current Outpatient Medications  Medication Sig Dispense Refill  . albuterol (PROVENTIL HFA) 108 (90 Base) MCG/ACT inhaler Inhale into the lungs.    . Ascorbic Acid (VITAMIN C PO) Take 1,000 mg by mouth daily.    Marland Kitchen aspirin 81 MG tablet Take 81 mg by mouth daily.    . benzonatate (TESSALON) 100 MG capsule Take 1-2 capsules (100-200 mg total) by mouth 2 (two) times daily as needed. 30 capsule 1  . Cholecalciferol (VITAMIN D-3 PO) Take 5,000 Units by mouth daily.    Marland Kitchen diltiazem (CARDIZEM CD) 120 MG 24 hr capsule Take 1 capsule (120 mg total) by mouth daily. 30 capsule 12  . fluticasone (FLONASE) 50 MCG/ACT nasal spray Place 2 sprays into  both nostrils daily. 16 g 0  . ibandronate (BONIVA) 150 MG tablet TAKE ONE TABLET BY MOUTH EVERY 30 DAYS 1 tablet 6  . ibuprofen (ADVIL,MOTRIN) 200 MG tablet Take by mouth.    . Misc Natural Products (OSTEO BI-FLEX JOINT SHIELD PO) Take 1 tablet by mouth daily.    . Omega-3 Krill Oil 300 MG CAPS Take 1 tablet by mouth daily.    . polyethylene glycol powder (GLYCOLAX/MIRALAX) 17 GM/SCOOP powder Take 17 g by mouth daily. 527 g 0  . SUMAtriptan (IMITREX) 100 MG tablet Take 1 tablet (100 mg total) by mouth as needed. 9 tablet 0  . vitamin B-12 (CYANOCOBALAMIN) 1000 MCG tablet Take by mouth.     No current facility-administered medications for this visit.     REVIEW OF SYSTEMS      Objective:  Objective   Vitals:   07/16/19 1422  BP: 124/74  Weight: 135 lb (61.2 kg)  Height: 5\' 2"  (1.575 m)    Body mass index is 24.69 kg/m.  Physical Exam Vitals signs and nursing note reviewed.  Constitutional:      Appearance: She is well-developed.  HENT:     Head: Normocephalic and atraumatic.  Eyes:     Pupils: Pupils are equal, round, and reactive to light.  Cardiovascular:     Rate and Rhythm: Normal rate and regular rhythm.  Pulmonary:     Effort: Pulmonary effort is normal. No respiratory distress.  Skin:    General: Skin is warm and dry.  Neurological:     Mental Status: She is alert and oriented to person, place, and time.  Psychiatric:        Behavior: Behavior normal.        Thought Content: Thought content normal.        Judgment: Judgment normal.        Assessment/Plan:    68 yo with pelvic pain Continued pelvic pain and right lower quadrant pain. She brought a list of her prior surgeries which were scanned into her media file. She has had a prior hysterectomy, a "bladder tuck", and a robotic sacralcolpoplexy, anterior and posterior repair and bladder mesh in 2009 with Dr. Myra Gianotti at [facility].   Given her history of extensive prolapse repair surgery I would recommend consultation with Urogynecology to evaluate for pelvic adhesions and/or treatment of her eneterocele. Laparoscopy could be considered to evaluate for adhesive disease. Discussed all of this with Bonnie Owens. She would like to proceed with the referral to Urogynecology for pelvic pain if her hip MRI does not show a cause.   More than 25 minutes were spent face to face with the patient in the room with more than 50% of the time spent providing counseling and discussing the plan of management.   Adrian Prows MD [facility] 07/16/2019 2:58 PM

## 2019-07-17 ENCOUNTER — Other Ambulatory Visit: Payer: Self-pay | Admitting: Surgery

## 2019-07-17 DIAGNOSIS — S76011S Strain of muscle, fascia and tendon of right hip, sequela: Secondary | ICD-10-CM

## 2019-07-17 DIAGNOSIS — D225 Melanocytic nevi of trunk: Secondary | ICD-10-CM | POA: Diagnosis not present

## 2019-07-17 DIAGNOSIS — L814 Other melanin hyperpigmentation: Secondary | ICD-10-CM | POA: Diagnosis not present

## 2019-07-17 DIAGNOSIS — Z85828 Personal history of other malignant neoplasm of skin: Secondary | ICD-10-CM | POA: Diagnosis not present

## 2019-07-17 DIAGNOSIS — L820 Inflamed seborrheic keratosis: Secondary | ICD-10-CM | POA: Diagnosis not present

## 2019-07-17 DIAGNOSIS — L578 Other skin changes due to chronic exposure to nonionizing radiation: Secondary | ICD-10-CM | POA: Diagnosis not present

## 2019-07-17 DIAGNOSIS — L821 Other seborrheic keratosis: Secondary | ICD-10-CM | POA: Diagnosis not present

## 2019-07-17 DIAGNOSIS — D1800 Hemangioma unspecified site: Secondary | ICD-10-CM | POA: Diagnosis not present

## 2019-07-17 DIAGNOSIS — D2230 Melanocytic nevi of unspecified part of face: Secondary | ICD-10-CM | POA: Diagnosis not present

## 2019-07-17 DIAGNOSIS — D229 Melanocytic nevi, unspecified: Secondary | ICD-10-CM | POA: Diagnosis not present

## 2019-07-24 ENCOUNTER — Other Ambulatory Visit: Admission: RE | Admit: 2019-07-24 | Payer: Medicare HMO | Source: Ambulatory Visit

## 2019-07-24 DIAGNOSIS — H401231 Low-tension glaucoma, bilateral, mild stage: Secondary | ICD-10-CM | POA: Diagnosis not present

## 2019-07-28 ENCOUNTER — Ambulatory Visit: Admission: RE | Admit: 2019-07-28 | Payer: Medicare HMO | Source: Home / Self Care | Admitting: Gastroenterology

## 2019-07-28 ENCOUNTER — Other Ambulatory Visit: Payer: Self-pay

## 2019-07-28 ENCOUNTER — Ambulatory Visit
Admission: RE | Admit: 2019-07-28 | Discharge: 2019-07-28 | Disposition: A | Discharge status: Home / Self Care | Payer: Medicare HMO | Source: Ambulatory Visit | Attending: Surgery | Admitting: Surgery

## 2019-07-28 DIAGNOSIS — S76011S Strain of muscle, fascia and tendon of right hip, sequela: Secondary | ICD-10-CM | POA: Insufficient documentation

## 2019-07-28 DIAGNOSIS — M1612 Unilateral primary osteoarthritis, left hip: Secondary | ICD-10-CM | POA: Diagnosis not present

## 2019-07-30 ENCOUNTER — Ambulatory Visit
Admission: RE | Admit: 2019-07-30 | Discharge: 2019-07-30 | Disposition: A | Discharge status: Home / Self Care | Payer: Medicare HMO | Source: Ambulatory Visit | Attending: Family Medicine | Admitting: Family Medicine

## 2019-07-30 DIAGNOSIS — Z1231 Encounter for screening mammogram for malignant neoplasm of breast: Secondary | ICD-10-CM | POA: Diagnosis not present

## 2019-08-05 DEATH — deceased

## 2019-08-18 ENCOUNTER — Other Ambulatory Visit: Payer: Self-pay

## 2019-08-18 ENCOUNTER — Ambulatory Visit: Payer: Medicare HMO | Admitting: Family Medicine

## 2019-08-18 ENCOUNTER — Encounter: Payer: Self-pay | Admitting: Family Medicine

## 2019-08-18 VITALS — BP 124/82 | HR 64 | Temp 96.9°F | Resp 16 | Ht 62.0 in | Wt 131.0 lb

## 2019-08-18 DIAGNOSIS — K469 Unspecified abdominal hernia without obstruction or gangrene: Secondary | ICD-10-CM | POA: Diagnosis not present

## 2019-08-18 DIAGNOSIS — R634 Abnormal weight loss: Secondary | ICD-10-CM | POA: Diagnosis not present

## 2019-08-18 DIAGNOSIS — M8580 Other specified disorders of bone density and structure, unspecified site: Secondary | ICD-10-CM

## 2019-08-18 DIAGNOSIS — Z23 Encounter for immunization: Secondary | ICD-10-CM

## 2019-08-18 DIAGNOSIS — I341 Nonrheumatic mitral (valve) prolapse: Secondary | ICD-10-CM

## 2019-08-18 DIAGNOSIS — J309 Allergic rhinitis, unspecified: Secondary | ICD-10-CM

## 2019-08-18 DIAGNOSIS — H6993 Unspecified Eustachian tube disorder, bilateral: Secondary | ICD-10-CM

## 2019-08-18 DIAGNOSIS — R232 Flushing: Secondary | ICD-10-CM

## 2019-08-18 DIAGNOSIS — E559 Vitamin D deficiency, unspecified: Secondary | ICD-10-CM

## 2019-08-18 DIAGNOSIS — E785 Hyperlipidemia, unspecified: Secondary | ICD-10-CM

## 2019-08-18 DIAGNOSIS — Z79899 Other long term (current) drug therapy: Secondary | ICD-10-CM | POA: Diagnosis not present

## 2019-08-18 DIAGNOSIS — K219 Gastro-esophageal reflux disease without esophagitis: Secondary | ICD-10-CM | POA: Diagnosis not present

## 2019-08-18 DIAGNOSIS — G43009 Migraine without aura, not intractable, without status migrainosus: Secondary | ICD-10-CM | POA: Diagnosis not present

## 2019-08-18 MED ORDER — SUMATRIPTAN SUCCINATE 100 MG PO TABS: 100.0000 mg | ORAL_TABLET | ORAL | 0 refills | Status: DC | PRN

## 2019-08-18 MED ORDER — IBANDRONATE SODIUM 150 MG PO TABS: ORAL_TABLET | ORAL | 6 refills | Status: DC

## 2019-08-18 MED ORDER — FLUTICASONE PROPIONATE 50 MCG/ACT NA SUSP: 2.0000 | Freq: Every day | NASAL | 0 refills | Status: DC

## 2019-08-18 NOTE — Progress Notes (Signed)
Name: Bonnie Owens   MRN: [id number]    DOB: November 30, 1951   Date:08/18/2019       Progress Note  Subjective  Chief Complaint  Chief Complaint  Patient presents with  . Migraine  . Gastroesophageal Reflux  . Dyslipidemia    HPI  Hot flashes: she noticed flushing described as hot sensation from the chest up to her face, and she gets red and pours sweat on her face. The warning side is nausea a few minutes prior to episode. Not associated with vomiting or diarrhea. No fever She had multiple tests done including 5HIAA done in September that were negative and was started on Effexor and improved symptoms slightly, she stopped Effexor because of nausea and since she stopped nausea has improved, explained that tests by GI, ENT all negative, it may be from menopause, but we will recheck labs.  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 She has a history of hysterectomy at age 84 and states right ovary removed - complicated surgery, but has left ovary, she also had bladder tech, seen by Dr. Nechama Guard and may need to see uro-gynecologist if colonoscopy negative   Osteopenia: with low Vitamin D , we will recheck labs, continue Boniva, no loner having dyspepsia.   Weight loss: she has lost over 15 lbs in the past few months, she states she was on Weight Watchers, but also has a lot of other symptoms, we will check Sed Rate and CRP  Migraine headaches: she states episodes are about once a week, it is described, usually right frontal area, described as throbbing , denies phonophobia or photophobia. Imitrex helps alleviate symptoms , symptoms resolves within a couple of hours.   Patient Active Problem List   Diagnosis Date Noted  . Strain of right hip 07/07/2019  . Gastritis 11/26/2018  . Hiatal hernia 11/26/2018  . Flushing 09/04/2018  . Palpitations 03/19/2017  . History of basal cell  carcinoma 11/20/2016  . Primary osteoarthritis of left knee 03/09/2016  . History of total knee replacement, left 03/09/2016  . Osteopenia 11/17/2015  . Migraine without aura and without status migrainosus, not intractable 11/17/2015  . Urge urinary incontinence 11/17/2015  . Dyslipidemia 11/17/2015  . Large breasts 11/17/2015  . Menopause 11/17/2015  . Vitamin D deficiency 11/17/2015  . MVP (mitral valve prolapse) 11/17/2015  . History of lumbar fusion 11/17/2015  . Chronic constipation 11/17/2015  . Venous insufficiency 11/17/2015  . GERD (gastroesophageal reflux disease) 11/17/2015  . Eustachian tube dysfunction 11/17/2015  . Sciatica of left side     Past Surgical History:  Procedure Laterality Date  . ABDOMINAL HYSTERECTOMY  1985  . APPENDECTOMY    . BASAL CELL CARCINOMA EXCISION Left 10/2016   Underneath Left Eye- [facility]- Dr. Nehemiah Massed  . COLONOSCOPY N/A 11/14/2017   Procedure: COLONOSCOPY;  Surgeon: Toledo, Benay Pike, MD;  Location: [facility] ENDOSCOPY;  Service: Endoscopy;  Laterality: N/A;  . ESOPHAGOGASTRODUODENOSCOPY (EGD) WITH PROPOFOL N/A 11/20/2018   Procedure: ESOPHAGOGASTRODUODENOSCOPY (EGD) WITH PROPOFOL;  Surgeon: Toledo, Benay Pike, MD;  Location: [facility] ENDOSCOPY;  Service: Gastroenterology;  Laterality: N/A;  . JOINT REPLACEMENT     (LT) TKR 2017  . lumbar descectomy    . sling procedure    . TOTAL KNEE ARTHROPLASTY Left 03/09/2016   Procedure: TOTAL KNEE ARTHROPLASTY;  Surgeon: Hessie Knows, MD;  Location: [facility] ORS;  Service: Orthopedics;  Laterality: Left;  . TUBAL  LIGATION    . tummy tuck      Family History  Problem Relation Age of Onset  . Breast cancer Sister 70       twice, both breasts  . Graves' disease Sister   . Goiter Sister   . Dementia Mother   . Cancer Father        liver  . Thyroid disease Brother   . Prostate cancer Brother   . Breast cancer Maternal Grandmother        ? age  . Dysfunctional uterine bleeding Daughter   . Dementia  Maternal Grandfather   . Cancer Paternal Grandmother     Social History   Socioeconomic History  . Marital status: Married    Spouse name: Coralyn Mark   . Number of children: 2  . Years of education: Not on file  . Highest education level: 12th grade  Occupational History  . Occupation: Estate agent. 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 Financial resource strain: Not hard at all  . Food insecurity    Worry: Never true    Inability: Never true  . Transportation needs    Medical: No    Non-medical: No  Tobacco Use  . Smoking status: Never Smoker  . Smokeless tobacco: Never Used  Substance and Sexual Activity  . Alcohol use: Yes    Alcohol/week: 1.0 standard drinks    Types: 1 Glasses of wine per week    Comment: weekly  . Drug use: No  . Sexual activity: Yes    Partners: Male    Birth control/protection: Surgical, Post-menopausal  Lifestyle  . Physical activity    Days per week: 2 days    Minutes per session: 30 min  . Stress: To some extent  Relationships  . Social connections    Talks on phone: More than three times a week    Gets together: Twice a week    Attends religious service: More than 4 times per year    Active member of club or organization: No    Attends meetings of clubs or organizations: Never    Relationship status: Married  . Intimate partner violence    Fear of current or ex partner: No    Emotionally abused: No    Physically abused: No    Forced sexual activity: No  Other Topics Concern  . Not on file  Social History Narrative   Husband is a Engineer, agricultural    She has 3 grandson.      Current Outpatient Medications:  .  Ascorbic Acid (VITAMIN C PO), Take 1,000 mg by mouth daily., Disp: , Rfl:  .  aspirin 81 MG tablet, Take 81 mg by mouth daily., Disp: , Rfl:  .  Cholecalciferol (VITAMIN D-3 PO), Take 5,000 Units by mouth daily., Disp: , Rfl:  .  diltiazem (CARDIZEM CD) 120 MG 24 hr capsule, Take 1 capsule (120 mg total) by mouth  daily., Disp: 30 capsule, Rfl: 12 .  fluticasone (FLONASE) 50 MCG/ACT nasal spray, Place 2 sprays into both nostrils daily., Disp: 16 g, Rfl: 0 .  ibandronate (BONIVA) 150 MG tablet, Take in the morning with a full glass of water, on an empty stomach, and do not take anything else by mouth or lie down for the next 30 min., Disp: 1 tablet, Rfl: 6 .  ibuprofen (ADVIL,MOTRIN) 200 MG tablet, Take by mouth., Disp: , Rfl:  .  LUMIGAN 0.01 % SOLN, , Disp: ,  Rfl:  .  Misc Natural Products (OSTEO BI-FLEX JOINT SHIELD PO), Take 1 tablet by mouth daily., Disp: , Rfl:  .  Omega-3 Krill Oil 300 MG CAPS, Take 1 tablet by mouth daily., Disp: , Rfl:  .  polyethylene glycol powder (GLYCOLAX/MIRALAX) 17 GM/SCOOP powder, Take 17 g by mouth daily., Disp: 527 g, Rfl: 0 .  SUMAtriptan (IMITREX) 100 MG tablet, Take 1 tablet (100 mg total) by mouth as needed., Disp: 9 tablet, Rfl: 0 .  vitamin B-12 (CYANOCOBALAMIN) 1000 MCG tablet, Take by mouth., Disp: , Rfl:   No Known Allergies  I personally reviewed active problem list, medication list, allergies, family history, social history, health maintenance with the patient/caregiver today.   ROS  Constitutional: Negative for fever, positive for  weight change.  Respiratory: Negative for cough and shortness of breath.   Cardiovascular: Negative for chest pain or palpitations.  Gastrointestinal: Negative for abdominal pain, no bowel changes.  Musculoskeletal: Negative for gait problem or joint swelling.  Skin: Negative for rash.  Neurological: Negative for dizziness , positive for  headache.   No other specific complaints in a complete review of systems (except as listed in HPI above).  Objective  Vitals:   08/18/19 0757 08/18/19 0802  BP: (!) 124/100 124/82  Pulse: 64   Resp: 16   Temp: (!) 96.9 F (36.1 C)   TempSrc: Temporal   SpO2: 98%   Weight: 131 lb (59.4 kg)   Height: 5\' 2"  (1.575 m)     Body mass index is 23.96 kg/m.  Physical  Exam  Constitutional: Patient appears well-developed and well-nourished. Obese No distress.  HEENT: head atraumatic, normocephalic, pupils equal and reactive to light Cardiovascular: Normal rate, regular rhythm and normal heart sounds.  No murmur heard. No BLE edema. Pulmonary/Chest: Effort normal and breath sounds normal. No respiratory distress. Abdominal: Soft.  There is no tenderness. Psychiatric: Patient has a normal mood and affect. behavior is normal. Judgment and thought content normal.  PHQ2/9: Depression screen [facility] 2/9 08/18/2019 02/14/2019 02/12/2019 12/26/2018 11/29/2018  Decreased Interest 0 0 0 0 0  Down, Depressed, Hopeless 0 0 0 0 0  PHQ - 2 Score 0 0 0 0 0  Altered sleeping 0 0 1 0 1  Tired, decreased energy 0 0 1 0 1  Change in appetite 0 0 0 0 0  Feeling bad or failure about yourself  0 0 0 0 0  Trouble concentrating 0 0 0 0 0  Moving slowly or fidgety/restless 0 0 0 0 0  Suicidal thoughts 0 0 0 0 0  PHQ-9 Score 0 0 2 0 2  Difficult doing work/chores Somewhat difficult - Not difficult at all - -    phq 9 is negative  Fall Risk: Fall Risk  08/18/2019 02/14/2019 02/12/2019 12/26/2018 11/29/2018  Falls in the past year? 0 0 0 0 0  Number falls in past yr: 0 0 0 0 0  Injury with Fall? 0 0 0 0 -    Functional Status Survey: Is the patient deaf or have difficulty hearing?: No Does the patient have difficulty seeing, even when wearing glasses/contacts?: No Does the patient have difficulty concentrating, remembering, or making decisions?: No Does the patient have difficulty walking or climbing stairs?: No Does the patient have difficulty dressing or bathing?: No Does the patient have difficulty doing errands alone such as visiting a doctor's office or shopping?: No    Assessment & Plan   1. Migraine without aura and without status  migrainosus, not intractable  - SUMAtriptan (IMITREX) 100 MG tablet; Take 1 tablet (100 mg total) by mouth as needed.  Dispense: 9  tablet; Refill: 0  2. Need for immunization against influenza  - Flu Vaccine QUAD High Dose(Fluad)  3. 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 Flushing  Seen by GI, had labs done, ENT  - CBC with Differential/Platelet - TSH  5. Gastroesophageal reflux disease without esophagitis   6. Dyslipidemia  - Lipid panel  7. Vitamin D deficiency  Vitamin D   8. Osteopenia, unspecified location  - TSH - ibandronate (BONIVA) 150 MG tablet; Take in the morning with a full glass of water, on an empty stomach, and do not take anything else by mouth or lie down for the next 30 min.  Dispense: 1 tablet; Refill: 6  9. Disorder of both eustachian tubes  - fluticasone (FLONASE) 50 MCG/ACT nasal spray; Place 2 sprays into both nostrils daily.  Dispense: 16 g; Refill: 0  10. Allergic Rhinitis   - fluticasone (FLONASE) 50 MCG/ACT nasal spray; Place 2 sprays into both nostrils daily.  Dispense: 16 g; Refill: 0  11. MVP (mitral valve prolapse)   12. Long-term use of high-risk medication  - COMPLETE METABOLIC PANEL WITH GFR - CBC with Differential/Platelet  13. Weight loss  She was on a diet, but having other symptoms also  - COMPLETE METABOLIC PANEL WITH GFR - CBC with Differential/Platelet - Sedimentation rate - C-reactive protein

## 2019-08-19 LAB — CBC WITH DIFFERENTIAL/PLATELET
Absolute Monocytes: 520 cells/uL (ref 200–950)
Basophils Absolute: 41 cells/uL (ref 0–200)
Basophils Relative: 0.8 %
Eosinophils Absolute: 51 cells/uL (ref 15–500)
Eosinophils Relative: 1 %
HCT: 42.2 % (ref 35.0–45.0)
Hemoglobin: 14 g/dL (ref 11.7–15.5)
Lymphs Abs: 2198 cells/uL (ref 850–3900)
MCH: 31.7 pg (ref 27.0–33.0)
MCHC: 33.2 g/dL (ref 32.0–36.0)
MCV: 95.5 fL (ref 80.0–100.0)
MPV: 8.9 fL (ref 7.5–12.5)
Monocytes Relative: 10.2 %
Neutro Abs: 2290 cells/uL (ref 1500–7800)
Neutrophils Relative %: 44.9 %
Platelets: 331 10*3/uL (ref 140–400)
RBC: 4.42 10*6/uL (ref 3.80–5.10)
RDW: 12.2 % (ref 11.0–15.0)
Total Lymphocyte: 43.1 %
WBC: 5.1 10*3/uL (ref 3.8–10.8)

## 2019-08-19 LAB — COMPLETE METABOLIC PANEL WITH GFR
AG Ratio: 1.7 (calc) (ref 1.0–2.5)
ALT: 11 U/L (ref 6–29)
AST: 16 U/L (ref 10–35)
Albumin: 4.3 g/dL (ref 3.6–5.1)
Alkaline phosphatase (APISO): 51 U/L (ref 37–153)
BUN: 13 mg/dL (ref 7–25)
CO2: 30 mmol/L (ref 20–32)
Calcium: 9.8 mg/dL (ref 8.6–10.4)
Chloride: 101 mmol/L (ref 98–110)
Creat: 0.69 mg/dL (ref 0.50–0.99)
GFR, Est African American: 103 mL/min/{1.73_m2} (ref >=60)
GFR, Est Non African American: 89 mL/min/{1.73_m2} (ref >=60)
Globulin: 2.6 g/dL (calc) (ref 1.9–3.7)
Glucose, Bld: 86 mg/dL (ref 65–99)
Potassium: 4.4 mmol/L (ref 3.5–5.3)
Sodium: 139 mmol/L (ref 135–146)
Total Bilirubin: 0.6 mg/dL (ref 0.2–1.2)
Total Protein: 6.9 g/dL (ref 6.1–8.1)

## 2019-08-19 LAB — LIPID PANEL
Cholesterol: 225 mg/dL — ABNORMAL HIGH (ref <200)
HDL: 64 mg/dL (ref >=50)
LDL Cholesterol (Calc): 137 mg/dL (calc) — ABNORMAL HIGH
Non-HDL Cholesterol (Calc): 161 mg/dL (calc) — ABNORMAL HIGH (ref <130)
Total CHOL/HDL Ratio: 3.5 (calc) (ref <5.0)
Triglycerides: 119 mg/dL (ref <150)

## 2019-08-19 LAB — SEDIMENTATION RATE: Sed Rate: 11 mm/h (ref 0–30)

## 2019-08-19 LAB — C-REACTIVE PROTEIN: CRP: 1.5 mg/L (ref <8.0)

## 2019-08-19 LAB — TSH: TSH: 2.35 mIU/L (ref 0.40–4.50)

## 2019-08-19 LAB — VITAMIN D 25 HYDROXY (VIT D DEFICIENCY, FRACTURES): Vit D, 25-Hydroxy: 32 ng/mL (ref 30–100)

## 2019-08-27 DIAGNOSIS — H524 Presbyopia: Secondary | ICD-10-CM | POA: Diagnosis not present

## 2019-08-27 DIAGNOSIS — H5213 Myopia, bilateral: Secondary | ICD-10-CM | POA: Diagnosis not present

## 2019-08-27 DIAGNOSIS — H401231 Low-tension glaucoma, bilateral, mild stage: Secondary | ICD-10-CM | POA: Diagnosis not present

## 2019-08-27 DIAGNOSIS — H52223 Regular astigmatism, bilateral: Secondary | ICD-10-CM | POA: Diagnosis not present

## 2019-08-28 ENCOUNTER — Other Ambulatory Visit
Admission: RE | Admit: 2019-08-28 | Discharge: 2019-08-28 | Disposition: A | Discharge status: Home / Self Care | Payer: Medicare HMO | Source: Ambulatory Visit | Attending: Gastroenterology | Admitting: Gastroenterology

## 2019-08-28 ENCOUNTER — Other Ambulatory Visit: Payer: Self-pay

## 2019-08-28 DIAGNOSIS — Z01812 Encounter for preprocedural laboratory examination: Secondary | ICD-10-CM | POA: Insufficient documentation

## 2019-08-28 DIAGNOSIS — Z20828 Contact with and (suspected) exposure to other viral communicable diseases: Secondary | ICD-10-CM | POA: Insufficient documentation

## 2019-08-29 LAB — SARS CORONAVIRUS 2 (TAT 6-24 HRS): SARS Coronavirus 2: NEGATIVE

## 2019-09-01 ENCOUNTER — Encounter: Payer: Self-pay | Admitting: *Deleted

## 2019-09-01 ENCOUNTER — Encounter
Admission: RE | Disposition: A | Discharge status: Home / Self Care | Payer: Self-pay | Source: Home / Self Care | Attending: Gastroenterology

## 2019-09-01 ENCOUNTER — Ambulatory Visit: Payer: Medicare HMO | Admitting: Certified Registered"

## 2019-09-01 ENCOUNTER — Other Ambulatory Visit: Payer: Self-pay

## 2019-09-01 ENCOUNTER — Ambulatory Visit
Admission: RE | Admit: 2019-09-01 | Discharge: 2019-09-01 | Disposition: A | Discharge status: Home / Self Care | Payer: Medicare HMO | Attending: Gastroenterology | Admitting: Gastroenterology

## 2019-09-01 ENCOUNTER — Encounter: Admission: RE | Payer: Self-pay | Source: Home / Self Care

## 2019-09-01 DIAGNOSIS — K5909 Other constipation: Secondary | ICD-10-CM | POA: Insufficient documentation

## 2019-09-01 DIAGNOSIS — R109 Unspecified abdominal pain: Secondary | ICD-10-CM | POA: Diagnosis not present

## 2019-09-01 DIAGNOSIS — Z7982 Long term (current) use of aspirin: Secondary | ICD-10-CM | POA: Insufficient documentation

## 2019-09-01 DIAGNOSIS — Z79899 Other long term (current) drug therapy: Secondary | ICD-10-CM | POA: Insufficient documentation

## 2019-09-01 DIAGNOSIS — Z791 Long term (current) use of non-steroidal anti-inflammatories (NSAID): Secondary | ICD-10-CM | POA: Insufficient documentation

## 2019-09-01 DIAGNOSIS — M8580 Other specified disorders of bone density and structure, unspecified site: Secondary | ICD-10-CM | POA: Diagnosis not present

## 2019-09-01 DIAGNOSIS — E785 Hyperlipidemia, unspecified: Secondary | ICD-10-CM | POA: Insufficient documentation

## 2019-09-01 DIAGNOSIS — Q438 Other specified congenital malformations of intestine: Secondary | ICD-10-CM | POA: Insufficient documentation

## 2019-09-01 DIAGNOSIS — Z7983 Long term (current) use of bisphosphonates: Secondary | ICD-10-CM | POA: Insufficient documentation

## 2019-09-01 DIAGNOSIS — K5730 Diverticulosis of large intestine without perforation or abscess without bleeding: Secondary | ICD-10-CM | POA: Diagnosis not present

## 2019-09-01 DIAGNOSIS — R1031 Right lower quadrant pain: Secondary | ICD-10-CM | POA: Insufficient documentation

## 2019-09-01 DIAGNOSIS — K5790 Diverticulosis of intestine, part unspecified, without perforation or abscess without bleeding: Secondary | ICD-10-CM | POA: Diagnosis not present

## 2019-09-01 DIAGNOSIS — K219 Gastro-esophageal reflux disease without esophagitis: Secondary | ICD-10-CM | POA: Insufficient documentation

## 2019-09-01 HISTORY — PX: COLONOSCOPY WITH PROPOFOL: SHX5780

## 2019-09-01 SURGERY — COLONOSCOPY WITH PROPOFOL
Anesthesia: General

## 2019-09-01 MED ORDER — PROPOFOL 10 MG/ML IV BOLUS
INTRAVENOUS | Status: DC | PRN
  Administered 2019-09-01: 40 mg via INTRAVENOUS
  Administered 2019-09-01: 20 mg via INTRAVENOUS
  Administered 2019-09-01 (×2): 30 mg via INTRAVENOUS

## 2019-09-01 MED ORDER — PROPOFOL 500 MG/50ML IV EMUL
INTRAVENOUS | Status: DC | PRN
  Administered 2019-09-01: 100 ug/kg/min via INTRAVENOUS

## 2019-09-01 MED ORDER — LIDOCAINE HCL (CARDIAC) PF 100 MG/5ML IV SOSY
PREFILLED_SYRINGE | INTRAVENOUS | Status: DC | PRN
  Administered 2019-09-01: 40 mg via INTRAVENOUS

## 2019-09-01 MED ORDER — PROPOFOL 500 MG/50ML IV EMUL
INTRAVENOUS | Status: AC
  Filled 2019-09-01: qty 50

## 2019-09-01 MED ORDER — PHENYLEPHRINE HCL (PRESSORS) 10 MG/ML IV SOLN
INTRAVENOUS | Status: DC | PRN
  Administered 2019-09-01: 50 ug via INTRAVENOUS
  Administered 2019-09-01: 100 ug via INTRAVENOUS
  Administered 2019-09-01: 50 ug via INTRAVENOUS

## 2019-09-01 MED ORDER — LIDOCAINE HCL (PF) 2 % IJ SOLN
INTRAMUSCULAR | Status: AC
  Filled 2019-09-01: qty 10

## 2019-09-01 MED ORDER — SODIUM CHLORIDE 0.9 % IV SOLN
INTRAVENOUS | Status: DC
  Administered 2019-09-01: 1000 mL via INTRAVENOUS

## 2019-09-01 NOTE — Anesthesia Postprocedure Evaluation (Signed)
Anesthesia Post Note  Patient: Bonnie Owens  Procedure(s) Performed: COLONOSCOPY WITH PROPOFOL (N/A )  Patient location during evaluation: Endoscopy Anesthesia Type: General Level of consciousness: awake and alert Pain management: pain level controlled Vital Signs Assessment: post-procedure vital signs reviewed and stable Respiratory status: spontaneous breathing and respiratory function stable Cardiovascular status: stable Anesthetic complications: no     Last Vitals:  Vitals:   09/01/19 1000 09/01/19 1010  BP: 123/78 125/80  Pulse: 70 67  Resp: 12 14  Temp:    SpO2: 100% 100%    Last Pain:  Vitals:   09/01/19 0940  TempSrc: Tympanic  PainSc:                  KEPHART,WILLIAM K

## 2019-09-01 NOTE — Op Note (Signed)
[facility] Gastroenterology Patient Name: Bonnie Owens Procedure Date: 09/01/2019 8:50 AM MRN: [id number] Account #: [id number] Date of Birth: [birth year] Admit Type: Outpatient Age: 68 Room: [facility] ENDO ROOM 3 Gender: Female Note Status: Finalized Procedure:            Colonoscopy Indications:          Abdominal pain in the right lower quadrant Providers:            Lollie Sails, MD Medicines:            Monitored Anesthesia Care Complications:        No immediate complications. Procedure:            Pre-Anesthesia Assessment:                       - ASA Grade Assessment: II - A patient with mild                        systemic disease.                       After obtaining informed consent, the colonoscope was                        passed under direct vision. Throughout the procedure,                        the patient's blood pressure, pulse, and oxygen                        saturations were monitored continuously. The                        Colonoscope was introduced through the anus and                        advanced to the the cecum, identified by appendiceal                        orifice and ileocecal valve. The colonoscopy was                        performed with moderate difficulty due to a redundant                        colon. Successful completion of the procedure was aided                        by changing the patient to a supine position, changing                        the patient to a prone position and using manual                        pressure. The patient tolerated the procedure well. The                        quality of the bowel preparation was good. Findings:      Multiple small-mouthed diverticula were found in the sigmoid colon and       descending colon.  The sigmoid colon, descending colon and transverse colon were moderately       redundant.      The retroflexed view of the distal rectum and anal verge was normal  and       showed no anal or rectal abnormalities.      The digital rectal exam was normal.      The IC valve was turned open, normal to appearance, though I was unable       to intubate due to angularity.      The exam was otherwise without abnormality. Impression:           - Diverticulosis in the sigmoid colon and in the                        descending colon.                       - Redundant colon.                       - The distal rectum and anal verge are normal on                        retroflexion view.                       - The examination was otherwise normal.                       - No specimens collected. Recommendation:       - Discharge patient to home.                       - consider low residue diet                       - consider referral to surgery versus GYN for opinion                        on enterocele. Procedure Code(s):    --- Professional ---                       [phone], Colonoscopy, flexible; diagnostic, including                        collection of specimen(s) by brushing or washing, when                        performed (separate procedure) Diagnosis Code(s):    --- Professional ---                       R10.31, Right lower quadrant pain                       K57.30, Diverticulosis of large intestine without                        perforation or abscess without bleeding                       Q43.8, Other specified congenital malformations of  intestine CPT copyright 2019 American Medical Association. All rights reserved. The codes documented in this report are preliminary and upon coder review may  be revised to meet current compliance requirements. Lollie Sails, MD 09/01/2019 9:45:42 AM This report has been signed electronically. Number of Addenda: 0 Note Initiated On: 09/01/2019 8:50 AM Scope Withdrawal Time: 0 hours 5 minutes 42 seconds  Total Procedure Duration: 0 hours 22 minutes 21 seconds       [facility]

## 2019-09-01 NOTE — H&P (Signed)
Outpatient short stay form Pre-procedure 09/01/2019 9:02 AM Bonnie Sails MD  Primary Physician: Dr. Steele Sizer  Reason for visit: Colonoscopy  History of present illness: Patient is a 68 year old female presenting today for colonoscopy in regards to right lower quadrant pain.  She is also had some increasing amounts of constipation.  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  She has had multiple pelvic instrumentations.  Her symptom of right lower quadrant pain is becoming more obvious in both intensity and frequency.  Has no rectal bleeding or black stools.    Current Facility-Administered Medications:  .  0.9 %  sodium chloride infusion, , Intravenous, Continuous, Bonnie Sails, MD, Last Rate: 20 mL/hr at 09/01/19 [id number]  Medications Prior to Admission  Medication Sig Dispense Refill Last Dose  . Ascorbic Acid (VITAMIN C PO) Take 1,000 mg by mouth daily.   Past Week at Unknown time  . aspirin 81 MG tablet Take 81 mg by mouth daily.   08/31/2019 at Unknown time  . Cholecalciferol (VITAMIN D-3 PO) Take 5,000 Units by mouth daily.   Past Week at Unknown time  . diltiazem (CARDIZEM CD) 120 MG 24 hr capsule Take 1 capsule (120 mg total) by mouth daily. 30 capsule 12 08/31/2019 at Unknown time  . fluticasone (FLONASE) 50 MCG/ACT nasal spray Place 2 sprays into both nostrils daily. 16 g 0 Past Week at Unknown time  . ibandronate (BONIVA) 150 MG tablet Take in the morning with a full glass of water, on an empty stomach, and do not take anything else by mouth or lie down for the next 30 min. 1 tablet 6 Past Month at Unknown time  . ibuprofen (ADVIL,MOTRIN) 200 MG tablet Take by mouth.   Past Week at Unknown time  . LUMIGAN 0.01 % SOLN    Past Week at Unknown time  . Misc Natural Products (OSTEO BI-FLEX JOINT SHIELD PO) Take 1 tablet by mouth daily.   Past Week at Unknown time  . Omega-3 Krill Oil 300 MG CAPS Take 1 tablet by mouth daily.   Past Week at Unknown time  . polyethylene glycol  powder (GLYCOLAX/MIRALAX) 17 GM/SCOOP powder Take 17 g by mouth daily. 527 g 0 08/31/2019 at Unknown time  . SUMAtriptan (IMITREX) 100 MG tablet Take 1 tablet (100 mg total) by mouth as needed. 9 tablet 0 Past Month at Unknown time  . vitamin B-12 (CYANOCOBALAMIN) 1000 MCG tablet Take by mouth.   Past Week at Unknown time     No Known Allergies   Past Medical History:  Diagnosis Date  . Anemia   . Anginal pain ([place])   . Chronic constipation   . Complication of anesthesia    Bad headache when waking  . Depression   . Dyspnea   . GERD (gastroesophageal reflux disease)   . Headache    occasional migraine  . Hyperlipidemia   . Left knee pain   . Mitral valve prolapse   . Osteopenia   . Urine incontinence   . Venous (peripheral) insufficiency   . Vitamin D deficiency     Review of systems:      Physical Exam    Heart and lungs: Regular rate and rhythm without rub or gallop lungs are bilaterally clear    HEENT: Normocephalic atraumatic eyes are anicteric    Other:    Pertinant exam for procedure: Soft nontender nondistended bowel sounds positive normoactive    Planned proceedures: Colonoscopy and indicated procedures. I have discussed  the risks benefits and complications of procedures to include not limited to bleeding, infection, perforation and the risk of sedation and the patient wishes to proceed.    Bonnie Sails, MD Gastroenterology 09/01/2019  9:02 AM

## 2019-09-01 NOTE — Anesthesia Procedure Notes (Signed)
Procedure Name: MAC Date/Time: 09/01/2019 9:06 AM Performed by: Lavone Orn, CRNA Pre-anesthesia Checklist: Patient identified, Emergency Drugs available, Suction available, Patient being monitored and Timeout performed Patient Re-evaluated:Patient Re-evaluated prior to induction Oxygen Delivery Method: Nasal cannula

## 2019-09-01 NOTE — Anesthesia Post-op Follow-up Note (Signed)
Anesthesia QCDR form completed.        

## 2019-09-01 NOTE — Transfer of Care (Signed)
Immediate Anesthesia Transfer of Care Note  Patient: ADISYNN AROSTEGUI  Procedure(s) Performed: COLONOSCOPY WITH PROPOFOL (N/A )  Patient Location: PACU  Anesthesia Type:General  Level of Consciousness: awake, alert  and oriented  Airway & Oxygen Therapy: Patient Spontanous Breathing  Post-op Assessment: Report given to RN and Post -op Vital signs reviewed and stable  Post vital signs: Reviewed and stable  Last Vitals:  Vitals Value Taken Time  BP 111/69 09/01/19 0946  Temp 36.4 C 09/01/19 0946  Pulse 64 09/01/19 0946  Resp 14 09/01/19 0946  SpO2 100 % 09/01/19 0946    Last Pain:  Vitals:   09/01/19 0805  TempSrc: Tympanic  PainSc: 0-No pain         Complications: No apparent anesthesia complications

## 2019-09-01 NOTE — Anesthesia Preprocedure Evaluation (Addendum)
Anesthesia Evaluation  Patient identified by MRN, date of birth, ID band Patient awake    Reviewed: Allergy & Precautions, NPO status , Patient's Chart, lab work & pertinent test results  History of Anesthesia Complications (+) history of anesthetic complications (severe HA)  Airway Mallampati: II       Dental   Pulmonary neg sleep apnea, neg COPD, Not current smoker,           Cardiovascular (-) hypertension(-) CHF (-) dysrhythmias (-) Valvular Problems/Murmurs     Neuro/Psych neg Seizures Depression    GI/Hepatic Neg liver ROS, hiatal hernia, GERD  Medicated,  Endo/Other  neg diabetes  Renal/GU negative Renal ROS     Musculoskeletal   Abdominal   Peds  Hematology  (+) anemia ,   Anesthesia Other Findings   Reproductive/Obstetrics                           Anesthesia Physical Anesthesia Plan  ASA: II  Anesthesia Plan: General   Post-op Pain Management:    Induction: Intravenous  PONV Risk Score and Plan: 2 and Propofol infusion, TIVA and Ondansetron  Airway Management Planned: Nasal Cannula  Additional Equipment:   Intra-op Plan:   Post-operative Plan:   Informed Consent: I have reviewed the patients History and Physical, chart, labs and discussed the procedure including the risks, benefits and alternatives for the proposed anesthesia with the patient or authorized representative who has indicated his/her understanding and acceptance.       Plan Discussed with:   Anesthesia Plan Comments:         Anesthesia Quick Evaluation

## 2019-09-03 ENCOUNTER — Encounter: Payer: Self-pay | Admitting: Gastroenterology

## 2019-09-15 ENCOUNTER — Other Ambulatory Visit: Payer: Self-pay | Admitting: Family Medicine

## 2019-09-15 DIAGNOSIS — J309 Allergic rhinitis, unspecified: Secondary | ICD-10-CM

## 2019-09-15 DIAGNOSIS — H6993 Unspecified Eustachian tube disorder, bilateral: Secondary | ICD-10-CM

## 2019-10-14 ENCOUNTER — Other Ambulatory Visit: Payer: Self-pay | Admitting: Family Medicine

## 2019-10-14 DIAGNOSIS — J309 Allergic rhinitis, unspecified: Secondary | ICD-10-CM

## 2019-10-14 DIAGNOSIS — H6993 Unspecified Eustachian tube disorder, bilateral: Secondary | ICD-10-CM

## 2019-11-13 ENCOUNTER — Other Ambulatory Visit: Payer: Self-pay

## 2019-11-13 DIAGNOSIS — Z20822 Contact with and (suspected) exposure to covid-19: Secondary | ICD-10-CM

## 2019-11-15 LAB — NOVEL CORONAVIRUS, NAA: SARS-CoV-2, NAA: NOT DETECTED

## 2019-12-02 ENCOUNTER — Ambulatory Visit (INDEPENDENT_AMBULATORY_CARE_PROVIDER_SITE_OTHER): Payer: Medicare HMO

## 2019-12-02 ENCOUNTER — Other Ambulatory Visit: Payer: Self-pay

## 2019-12-02 VITALS — BP 108/68 | HR 78 | Temp 96.8°F | Resp 16 | Ht 62.0 in | Wt 139.7 lb

## 2019-12-02 DIAGNOSIS — Z0000 Encounter for general adult medical examination without abnormal findings: Secondary | ICD-10-CM

## 2019-12-02 NOTE — Progress Notes (Signed)
Subjective:   Bonnie Owens is a 68 y.o. female who presents for Medicare Annual (Subsequent) preventive examination.  Review of Systems:   Cardiac Risk Factors include: advanced age (>17men, >50 women);hypertension     Objective:     Vitals: BP 108/68 (BP Location: Left Arm, Patient Position: Sitting, Cuff Size: Normal)   Pulse 78   Temp (!) 96.8 F (36 C) (Temporal)   Resp 16   Ht 5\' 2"  (1.575 m)   Wt 139 lb 11.2 oz (63.4 kg)   LMP  (LMP Unknown)   SpO2 98%   BMI 25.55 kg/m   Body mass index is 25.55 kg/m.  Advanced Directives 12/02/2019 09/01/2019 11/29/2018 11/20/2018 11/20/2018 05/14/2017 11/20/2016  Does Patient Have a Medical Advance Directive? Yes Yes Yes Yes No;Yes No Yes  Type of Paramedic of [place];Living will - [place];Living will - Out of facility DNR (pink MOST or yellow form) - [place];Living will  Does patient want to make changes to medical advance directive? - - - - - - -  Copy of [place] in Chart? Yes - validated most recent copy scanned in chart (See row information) - Yes - validated most recent copy scanned in chart (See row information) - - - No - copy requested  Would patient like information on creating a medical advance directive? - - - - - - -    Tobacco Social History   Tobacco Use  Smoking Status Never Smoker  Smokeless Tobacco Never Used     Counseling given: Not Answered   Clinical Intake:  Pre-visit preparation completed: Yes  Pain : 0-10 Pain Score: 4  Pain Type: Chronic pain Pain Location: Abdomen Pain Orientation: Right Pain Descriptors / Indicators: Aching, Discomfort Pain Onset: More than a month ago Pain Frequency: Intermittent     BMI - recorded: 25.55 Nutritional Status: BMI 25 -29 Overweight Nutritional Risks: None Diabetes: No  How often do you need to have someone help you when you read instructions, pamphlets, or  other written materials from your doctor or pharmacy?: 1 - Never  Interpreter Needed?: No  Information entered by :: Clemetine Marker LPN  Past Medical History:  Diagnosis Date  . Anemia   . Anginal pain ([place])   . Chronic constipation   . Complication of anesthesia    Bad headache when waking  . Depression   . Dyspnea   . GERD (gastroesophageal reflux disease)   . Glaucoma   . Headache    occasional migraine  . Hyperlipidemia   . Left knee pain   . Mitral valve prolapse   . Osteopenia   . Urine incontinence   . Venous (peripheral) insufficiency   . Vitamin D deficiency    Past Surgical History:  Procedure Laterality Date  . ABDOMINAL HYSTERECTOMY  1985  . APPENDECTOMY    . BACK SURGERY    . BASAL CELL CARCINOMA EXCISION Left 10/2016   Underneath Left Eye- Orchid Skin- Dr. Nehemiah Massed  . COLONOSCOPY N/A 11/14/2017   Procedure: COLONOSCOPY;  Surgeon: Toledo, Benay Pike, MD;  Location: [facility] ENDOSCOPY;  Service: Endoscopy;  Laterality: N/A;  . COLONOSCOPY WITH PROPOFOL N/A 09/01/2019   Procedure: COLONOSCOPY WITH PROPOFOL;  Surgeon: Lollie Sails, MD;  Location: [facility] ENDOSCOPY;  Service: Endoscopy;  Laterality: N/A;  . ESOPHAGOGASTRODUODENOSCOPY (EGD) WITH PROPOFOL N/A 11/20/2018   Procedure: ESOPHAGOGASTRODUODENOSCOPY (EGD) WITH PROPOFOL;  Surgeon: Toledo, Benay Pike, MD;  Location: [facility] ENDOSCOPY;  Service:  Gastroenterology;  Laterality: N/A;  . JOINT REPLACEMENT     (LT) TKR 2017  . lumbar descectomy    . sling procedure    . TOTAL KNEE ARTHROPLASTY Left 03/09/2016   Procedure: TOTAL KNEE ARTHROPLASTY;  Surgeon: Hessie Knows, MD;  Location: [facility] ORS;  Service: Orthopedics;  Laterality: Left;  . TUBAL LIGATION    . tummy tuck     Family History  Problem Relation Age of Onset  . Breast cancer Sister 70       twice, both breasts  . Graves' disease Sister   . Goiter Sister   . Dementia Mother   . Cancer Father        liver  . Thyroid disease Brother   . Prostate cancer  Brother   . Breast cancer Maternal Grandmother        ? age  . Dysfunctional uterine bleeding Daughter   . Dementia Maternal Grandfather   . Cancer Paternal Grandmother    Social History   Socioeconomic History  . Marital status: Married    Spouse name: Coralyn Mark   . Number of children: 2  . Years of education: Not on file  . Highest education level: 12th grade  Occupational History  . Occupation: Estate agent. 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 Smoking status: Never Smoker  . Smokeless tobacco: Never Used  Substance and Sexual Activity  . Alcohol use: Yes    Alcohol/week: 1.0 standard drinks    Types: 1 Glasses of wine per week    Comment: weekly  . Drug use: Never  . Sexual activity: Yes    Partners: Male    Birth control/protection: Surgical, Post-menopausal  Other Topics Concern  . Not on file  Social History Narrative   Husband is a Engineer, agricultural    She has 3 grandson.    Social Determinants of Health   Financial Resource Strain:   . Difficulty of Paying Living Expenses: Not on file  Food Insecurity:   . Worried About Charity fundraiser in the Last Year: Not on file  . Ran Out of Food in the Last Year: Not on file  Transportation Needs:   . Lack of Transportation (Medical): Not on file  . Lack of Transportation (Non-Medical): Not on file  Physical Activity: Insufficiently Active  . Days of Exercise per Week: 2 days  . Minutes of Exercise per Session: 30 min  Stress:   . Feeling of Stress : Not on file  Social Connections:   . Frequency of Communication with Friends and Family: Not on file  . Frequency of Social Gatherings with Friends and Family: Not on file  . Attends Religious Services: Not on file  . Active Member of Clubs or Organizations: Not on file  . Attends Archivist Meetings: Not on file  . Marital Status: Not on file    Outpatient Encounter Medications as of 12/02/2019  Medication Sig  . Ascorbic Acid (VITAMIN C  PO) Take 1,000 mg by mouth daily.  Marland Kitchen aspirin 81 MG tablet Take 81 mg by mouth daily.  . Cholecalciferol (VITAMIN D-3 PO) Take 5,000 Units by mouth daily.  Marland Kitchen diltiazem (CARDIZEM CD) 120 MG 24 hr capsule Take 1 capsule (120 mg total) by mouth daily.  . fluticasone (FLONASE) 50 MCG/ACT nasal spray Place 2 sprays into both nostrils daily.  Marland Kitchen ibandronate (BONIVA) 150 MG tablet Take in the morning with a full glass of water, on an  empty stomach, and do not take anything else by mouth or lie down for the next 30 min.  Marland Kitchen LUMIGAN 0.01 % SOLN   . Misc Natural Products (OSTEO BI-FLEX JOINT SHIELD PO) Take 1 tablet by mouth daily.  . Omega-3 Krill Oil 300 MG CAPS Take 1 tablet by mouth daily.  . polyethylene glycol powder (GLYCOLAX/MIRALAX) 17 GM/SCOOP powder Take 17 g by mouth daily.  . SUMAtriptan (IMITREX) 100 MG tablet Take 1 tablet (100 mg total) by mouth as needed.  . vitamin B-12 (CYANOCOBALAMIN) 1000 MCG tablet Take by mouth.  . [DISCONTINUED] ibuprofen (ADVIL,MOTRIN) 200 MG tablet Take by mouth.   No facility-administered encounter medications on file as of 12/02/2019.    Activities of Daily Living In your present state of health, do you have any difficulty performing the following activities: 12/02/2019 08/18/2019  Hearing? N N  Comment declines hearing aids -  Vision? N N  Comment - -  Difficulty concentrating or making decisions? N N  Walking or climbing stairs? N N  Dressing or bathing? N N  Doing errands, shopping? N N  Preparing Food and eating ? N -  Using the Toilet? N -  In the past six months, have you accidently leaked urine? N -  Do you have problems with loss of bowel control? N -  Managing your Medications? N -  Managing your Finances? N -  Housekeeping or managing your Housekeeping? N -  Some recent data might be hidden    Patient Care Team: Steele Sizer, MD as PCP - General (Family Medicine) Ubaldo Glassing Javier Docker, MD as Consulting Physician (Cardiology) Efrain Sella, MD as Consulting Physician (Gastroenterology) Hessie Knows, MD as Consulting Physician (Orthopedic Surgery)    Assessment:   This is a routine wellness examination for Nandita.  Exercise Activities and Dietary recommendations Current Exercise Habits: Home exercise routine, Type of exercise: walking(occasionally), Intensity: Mild, Exercise limited by: None identified  Goals    . Increase physical activity     Recommend increasing physical activity to 150 minutes per week.        Fall Risk Fall Risk  12/02/2019 08/18/2019 02/14/2019 02/12/2019 12/26/2018  Falls in the past year? 0 0 0 0 0  Number falls in past yr: 0 0 0 0 0  Injury with Fall? 0 0 0 0 0  Risk for fall due to : No Fall Risks - - - -  Follow up Falls prevention discussed - - - -   FALL RISK PREVENTION PERTAINING TO THE HOME:  Any stairs in or around the home? Yes  If so, do they handrails? Yes   Home free of loose throw rugs in walkways, pet beds, electrical cords, etc? Yes  Adequate lighting in your home to reduce risk of falls? Yes   ASSISTIVE DEVICES UTILIZED TO PREVENT FALLS:  Life alert? No  Use of a cane, walker or w/c? No  Grab bars in the bathroom? No  Shower chair or bench in shower? No  Elevated toilet seat or a handicapped toilet? No   DME ORDERS:  DME order needed?  No   TIMED UP AND GO:  Was the test performed? Yes Length of time to ambulate 10 feet: 5 sec.   GAIT:  Appearance of gait: Gait stead-fast and without the use of an assistive device.   Education: Fall risk prevention has been discussed.  Intervention(s) required? No    Depression Screen PHQ 2/9 Scores 12/02/2019 08/18/2019 02/14/2019 02/12/2019  PHQ -  2 Score 0 0 0 0  PHQ- 9 Score - 0 0 2     Cognitive Function - 6CIT deferred for 2020 AWV. Pt has no memory issues.      6CIT Screen 11/29/2018  What Year? 0 points  What month? 0 points  What time? 0 points  Count back from 20 0 points  Months in reverse 2  points  Repeat phrase 2 points  Total Score 4    Immunization History  Administered Date(s) Administered  . Fluad Quad(high Dose 65+) 08/18/2019  . Influenza Split 10/03/2012  . Influenza, High Dose Seasonal PF 08/16/2018  . Influenza, Seasonal, Injecte, Preservative Fre 10/02/2011  . Influenza,inj,Quad PF,6+ Mos 08/24/2014, 09/16/2015, 09/29/2016  . Influenza-Unspecified 08/24/2014, 09/14/2017  . Pneumococcal Conjugate-13 11/20/2016  . Pneumococcal Polysaccharide-23 04/14/2015  . Tdap 05/16/2010  . Zoster 10/02/2011  . Zoster Recombinat (Shingrix) 09/14/2017, 12/13/2017    Qualifies for Shingles Vaccine? Shingrx series completed.   Tdap: Up to date  Flu Vaccine: Up to date  Pneumococcal Vaccine: Up to date    Screening Tests Health Maintenance  Topic Date Due  . TETANUS/TDAP  05/16/2020  . MAMMOGRAM  07/29/2021  . COLONOSCOPY  08/31/2029  . INFLUENZA VACCINE  Completed  . DEXA SCAN  Completed  . Hepatitis C Screening  Completed  . PNA vac Low Risk Adult  Completed    Cancer Screenings:  Colorectal Screening: Completed 09/01/19. Repeat every 10 years;   Mammogram: Completed 07/30/19. Repeat every year.  Bone Density: Completed 12/31/17. Results reflect  OSTEOPENIA. Repeat every 2 years. Pt to discuss with Dr. Ancil Boozer at next appt for repeat screening.   Lung Cancer Screening: (Low Dose CT Chest recommended if Age 25-80 years, 30 pack-year currently smoking OR have quit w/in 15years.) does not qualify.    Additional Screening:  Hepatitis C Screening: does qualify; Completed 11/17/15  Vision Screening: Recommended annual ophthalmology exams for early detection of glaucoma and other disorders of the eye. Is the patient up to date with their annual eye exam?  Yes  Who is the provider or what is the name of the office in which the pt attends annual eye exams? [place] Screening: Recommended annual dental exams for proper oral hygiene  Community  Resource Referral:  CRR required this visit?  No      Plan:     I have personally reviewed and addressed the Medicare Annual Wellness questionnaire and have noted the following in the patient's chart:  A. Medical and social history B. Use of alcohol, tobacco or illicit drugs  C. Current medications and supplements D. Functional ability and status E.  Nutritional status F.  Physical activity G. Advance directives H. List of other physicians I.  Hospitalizations, surgeries, and ER visits in previous 12 months J.  [place] such as hearing and vision if needed, cognitive and depression L. Referrals and appointments   In addition, I have reviewed and discussed with patient certain preventive protocols, quality metrics, and best practice recommendations. A written personalized care plan for preventive services as well as general preventive health recommendations were provided to patient.   Signed,  Clemetine Marker, LPN Nurse Health Advisor   Nurse Notes: pt states ongoing issues with right lower abdomen. Would like to discuss recent screenings with Dr. Ancil Boozer. Advised to schedule appt at checkout for follow up as no future appt scheduled.

## 2019-12-02 NOTE — Patient Instructions (Signed)
[name] , Thank you for taking time to come for your Medicare Wellness Visit. I appreciate your ongoing commitment to your health goals. Please review the following plan we discussed and let me know if I can assist you in the future.   Screening recommendations/referrals: Colonoscopy: done 09/01/19 Mammogram: done 07/30/19 Bone Density: done 12/31/17 Recommended yearly ophthalmology/optometry visit for glaucoma screening and checkup Recommended yearly dental visit for hygiene and checkup  Vaccinations: Influenza vaccine: done 08/18/19 Pneumococcal vaccine: done 11/20/16 Tdap vaccine: done 05/16/10 Shingles vaccine: done 2019    Conditions/risks identified: Recommend healthy eating and physical activity.   Next appointment: Please follow up in one year for your Medicare Annual Wellness visit.     Preventive Care 69 Years and Older, Female Preventive care refers to lifestyle choices and visits with your health care provider that can promote health and wellness. What does preventive care include?  A yearly physical exam. This is also called an annual well check.  Dental exams once or twice a year.  Routine eye exams. Ask your health care provider how often you should have your eyes checked.  Personal lifestyle choices, including:  Daily care of your teeth and gums.  Regular physical activity.  Eating a healthy diet.  Avoiding tobacco and drug use.  Limiting alcohol use.  Practicing safe sex.  Taking low-dose aspirin every day.  Taking vitamin and mineral supplements as recommended by your health care provider. What happens during an annual well check? The services and screenings done by your health care provider during your annual well check will depend on your age, overall health, lifestyle risk factors, and family history of disease. Counseling  Your health care provider may ask you questions about your:  Alcohol use.  Tobacco use.  Drug use.  Emotional  well-being.  Home and relationship well-being.  Sexual activity.  Eating habits.  History of falls.  Memory and ability to understand (cognition).  Work and work Statistician.  Reproductive health. Screening  You may have the following tests or measurements:  Height, weight, and BMI.  Blood pressure.  Lipid and cholesterol levels. These may be checked every 5 years, or more frequently if you are over 65 years old.  Skin check.  Lung cancer screening. You may have this screening every year starting at age 17 if you have a 30-pack-year history of smoking and currently smoke or have quit within the past 15 years.  Fecal occult blood test (FOBT) of the stool. You may have this test every year starting at age 67.  Flexible sigmoidoscopy or colonoscopy. You may have a sigmoidoscopy every 5 years or a colonoscopy every 10 years starting at age 49.  Hepatitis C blood test.  Hepatitis B blood test.  Sexually transmitted disease (STD) testing.  Diabetes screening. This is done by checking your blood sugar (glucose) after you have not eaten for a while (fasting). You may have this done every 1-3 years.  Bone density scan. This is done to screen for osteoporosis. You may have this done starting at age 18.  Mammogram. This may be done every 1-2 years. Talk to your health care provider about how often you should have regular mammograms. Talk with your health care provider about your test results, treatment options, and if necessary, the need for more tests. Vaccines  Your health care provider may recommend certain vaccines, such as:  Influenza vaccine. This is recommended every year.  Tetanus, diphtheria, and acellular pertussis (Tdap, Td) vaccine. You may need a  Td booster every 10 years.  Zoster vaccine. You may need this after age 75.  Pneumococcal 13-valent conjugate (PCV13) vaccine. One dose is recommended after age 65.  Pneumococcal polysaccharide (PPSV23) vaccine. One  dose is recommended after age 47. Talk to your health care provider about which screenings and vaccines you need and how often you need them. This information is not intended to replace advice given to you by your health care provider. Make sure you discuss any questions you have with your health care provider. Document Released: 12/17/2015 Document Revised: 08/09/2016 Document Reviewed: 09/21/2015 Elsevier Interactive Patient Education  2017 [place] Prevention in the Home Falls can cause injuries. They can happen to people of all ages. There are many things you can do to make your home safe and to help prevent falls. What can I do on the outside of my home?  Regularly fix the edges of walkways and driveways and fix any cracks.  Remove anything that might make you trip as you walk through a door, such as a raised step or threshold.  Trim any bushes or trees on the path to your home.  Use bright outdoor lighting.  Clear any walking paths of anything that might make someone trip, such as rocks or tools.  Regularly check to see if handrails are loose or broken. Make sure that both sides of any steps have handrails.  Any raised decks and porches should have guardrails on the edges.  Have any leaves, snow, or ice cleared regularly.  Use sand or salt on walking paths during winter.  Clean up any spills in your garage right away. This includes oil or grease spills. What can I do in the bathroom?  Use night lights.  Install grab bars by the toilet and in the tub and shower. Do not use towel bars as grab bars.  Use non-skid mats or decals in the tub or shower.  If you need to sit down in the shower, use a plastic, non-slip stool.  Keep the floor dry. Clean up any water that spills on the floor as soon as it happens.  Remove soap buildup in the tub or shower regularly.  Attach bath mats securely with double-sided non-slip rug tape.  Do not have throw rugs and other  things on the floor that can make you trip. What can I do in the bedroom?  Use night lights.  Make sure that you have a light by your bed that is easy to reach.  Do not use any sheets or blankets that are too big for your bed. They should not hang down onto the floor.  Have a firm chair that has side arms. You can use this for support while you get dressed.  Do not have throw rugs and other things on the floor that can make you trip. What can I do in the kitchen?  Clean up any spills right away.  Avoid walking on wet floors.  Keep items that you use a lot in easy-to-reach places.  If you need to reach something above you, use a strong step stool that has a grab bar.  Keep electrical cords out of the way.  Do not use floor polish or wax that makes floors slippery. If you must use wax, use non-skid floor wax.  Do not have throw rugs and other things on the floor that can make you trip. What can I do with my stairs?  Do not leave any items on the stairs.  Make sure that there are handrails on both sides of the stairs and use them. Fix handrails that are broken or loose. Make sure that handrails are as long as the stairways.  Check any carpeting to make sure that it is firmly attached to the stairs. Fix any carpet that is loose or worn.  Avoid having throw rugs at the top or bottom of the stairs. If you do have throw rugs, attach them to the floor with carpet tape.  Make sure that you have a light switch at the top of the stairs and the bottom of the stairs. If you do not have them, ask someone to add them for you. What else can I do to help prevent falls?  Wear shoes that:  Do not have high heels.  Have rubber bottoms.  Are comfortable and fit you well.  Are closed at the toe. Do not wear sandals.  If you use a stepladder:  Make sure that it is fully opened. Do not climb a closed stepladder.  Make sure that both sides of the stepladder are locked into place.  Ask  someone to hold it for you, if possible.  Clearly mark and make sure that you can see:  Any grab bars or handrails.  First and last steps.  Where the edge of each step is.  Use tools that help you move around (mobility aids) if they are needed. These include:  Canes.  Walkers.  Scooters.  Crutches.  Turn on the lights when you go into a dark area. Replace any light bulbs as soon as they burn out.  Set up your furniture so you have a clear path. Avoid moving your furniture around.  If any of your floors are uneven, fix them.  If there are any pets around you, be aware of where they are.  Review your medicines with your doctor. Some medicines can make you feel dizzy. This can increase your chance of falling. Ask your doctor what other things that you can do to help prevent falls. This information is not intended to replace advice given to you by your health care provider. Make sure you discuss any questions you have with your health care provider. Document Released: 09/16/2009 Document Revised: 04/27/2016 Document Reviewed: 12/25/2014 Elsevier Interactive Patient Education  2017 [employer].

## 2019-12-08 ENCOUNTER — Other Ambulatory Visit: Payer: Self-pay | Admitting: Family Medicine

## 2019-12-08 DIAGNOSIS — G43009 Migraine without aura, not intractable, without status migrainosus: Secondary | ICD-10-CM

## 2019-12-18 ENCOUNTER — Ambulatory Visit: Payer: Medicare HMO | Admitting: Family Medicine

## 2019-12-18 ENCOUNTER — Encounter: Payer: Self-pay | Admitting: Family Medicine

## 2019-12-18 ENCOUNTER — Other Ambulatory Visit: Payer: Self-pay

## 2019-12-18 VITALS — BP 130/88 | HR 105 | Temp 96.8°F | Ht 62.0 in | Wt 136.5 lb

## 2019-12-18 DIAGNOSIS — M8580 Other specified disorders of bone density and structure, unspecified site: Secondary | ICD-10-CM

## 2019-12-18 DIAGNOSIS — E785 Hyperlipidemia, unspecified: Secondary | ICD-10-CM | POA: Diagnosis not present

## 2019-12-18 DIAGNOSIS — K469 Unspecified abdominal hernia without obstruction or gangrene: Secondary | ICD-10-CM

## 2019-12-18 DIAGNOSIS — Z9889 Other specified postprocedural states: Secondary | ICD-10-CM

## 2019-12-18 DIAGNOSIS — K219 Gastro-esophageal reflux disease without esophagitis: Secondary | ICD-10-CM

## 2019-12-18 DIAGNOSIS — I341 Nonrheumatic mitral (valve) prolapse: Secondary | ICD-10-CM | POA: Diagnosis not present

## 2019-12-18 DIAGNOSIS — E559 Vitamin D deficiency, unspecified: Secondary | ICD-10-CM | POA: Diagnosis not present

## 2019-12-18 DIAGNOSIS — G43009 Migraine without aura, not intractable, without status migrainosus: Secondary | ICD-10-CM | POA: Diagnosis not present

## 2019-12-18 DIAGNOSIS — R1031 Right lower quadrant pain: Secondary | ICD-10-CM

## 2019-12-18 DIAGNOSIS — K5909 Other constipation: Secondary | ICD-10-CM

## 2019-12-18 MED ORDER — SUMATRIPTAN SUCCINATE 100 MG PO TABS: 100.0000 mg | ORAL_TABLET | ORAL | 5 refills | Status: DC | PRN

## 2019-12-18 MED ORDER — POLYETHYLENE GLYCOL 3350 17 GM/SCOOP PO POWD: 17.0000 g | Freq: Every day | ORAL | 0 refills | Status: DC

## 2019-12-18 NOTE — Progress Notes (Signed)
Name: Bonnie Owens   MRN: [id number]    DOB: 1951-08-03   Date:12/18/2019       Progress Note  Subjective  Chief Complaint  Chief Complaint  Patient presents with  . Migraine  . Gastroesophageal Reflux  . Dyslipidemia    HPI  Hot flashes: she noticed flushing described as hot sensation from the chest up to her face, and she gets red and pours sweat on her face. The warning side is nausea a few minutes prior to episode. Not associated with vomiting or diarrhea. No fever She had multiple tests done including 5HIAA done in September that were negative and was started on Effexor and improved symptoms slightly, she stopped Effexor because of nausea and since she stopped nausea has improved, explained that tests by GI, ENT all negative, it may be from menopause. 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 She has a history of hysterectomy at age 76 and states right ovary removed - complicated surgery, but has left ovary, she also had bladder tech, she has seen  Dr. Nechama Guard and GI : Dr. Gustavo Lah , he recommended exploratory surgery, also discussed Uro-gyenocology   Osteopenia: with low Vitamin D but last level was at goal, continue Boniva, vitamin D supplementation , no longer having dyspepsia.   Weight loss: she has lost over 16  lbs in last year , she states she was on Weight Watchers, but also has a lot of other symptoms. Last sed rate and CRP negative   History of esophageal dilation: 11/2018, following a GERD appropriate diet , not reclining after meals and takes medication prn for GERD  Dyslipidemia: improved with weight loss  Migraine headaches: she states episodes are about once a week, it is described, usually right frontal area, described as throbbing , denies phonophobia or photophobia. Imitrex helps alleviate symptoms , symptoms resolves within a couple  of hours. She states last month she has 4 episodes but is back to baseline now.   Patient Active Problem List   Diagnosis Date Noted  . Strain of right hip 07/07/2019  . Gastritis 11/26/2018  . Hiatal hernia 11/26/2018  . Flushing 09/04/2018  . Palpitations 03/19/2017  . History of basal cell carcinoma 11/20/2016  . Primary osteoarthritis of left knee 03/09/2016  . History of total knee replacement, left 03/09/2016  . Osteopenia 11/17/2015  . Migraine without aura and without status migrainosus, not intractable 11/17/2015  . Urge urinary incontinence 11/17/2015  . Dyslipidemia 11/17/2015  . Large breasts 11/17/2015  . Menopause 11/17/2015  . Vitamin D deficiency 11/17/2015  . MVP (mitral valve prolapse) 11/17/2015  . History of lumbar fusion 11/17/2015  . Chronic constipation 11/17/2015  . Venous insufficiency 11/17/2015  . GERD (gastroesophageal reflux disease) 11/17/2015  . Eustachian tube dysfunction 11/17/2015  . Sciatica of left side     Past Surgical History:  Procedure Laterality Date  . ABDOMINAL HYSTERECTOMY  1985  . APPENDECTOMY    . BACK SURGERY    . BASAL CELL CARCINOMA EXCISION Left 10/2016   Underneath Left Eye- [facility]- Dr. Nehemiah Massed  . COLONOSCOPY N/A 11/14/2017   Procedure: COLONOSCOPY;  Surgeon: Toledo, Benay Pike, MD;  Location: [facility] ENDOSCOPY;  Service: Endoscopy;  Laterality: N/A;  . COLONOSCOPY WITH PROPOFOL N/A 09/01/2019   Procedure: COLONOSCOPY WITH PROPOFOL;  Surgeon: Lollie Sails, MD;  Location: [facility] ENDOSCOPY;  Service: Endoscopy;  Laterality: N/A;  .  ESOPHAGOGASTRODUODENOSCOPY (EGD) WITH PROPOFOL N/A 11/20/2018   Procedure: ESOPHAGOGASTRODUODENOSCOPY (EGD) WITH PROPOFOL;  Surgeon: Toledo, Benay Pike, MD;  Location: [facility] ENDOSCOPY;  Service: Gastroenterology;  Laterality: N/A;  . JOINT REPLACEMENT     (LT) TKR 2017  . lumbar descectomy    . sling procedure    . TOTAL KNEE ARTHROPLASTY Left 03/09/2016   Procedure: TOTAL KNEE  ARTHROPLASTY;  Surgeon: Hessie Knows, MD;  Location: [facility] ORS;  Service: Orthopedics;  Laterality: Left;  . TUBAL LIGATION    . tummy tuck      Family History  Problem Relation Age of Onset  . Breast cancer Sister 70       twice, both breasts  . Graves' disease Sister   . Goiter Sister   . Dementia Mother   . Cancer Father        liver  . Thyroid disease Brother   . Prostate cancer Brother   . Breast cancer Maternal Grandmother        ? age  . Dysfunctional uterine bleeding Daughter   . Dementia Maternal Grandfather   . Cancer Paternal Grandmother      Current Outpatient Medications:  .  Ascorbic Acid (VITAMIN C PO), Take 1,000 mg by mouth daily., Disp: , Rfl:  .  aspirin 81 MG tablet, Take 81 mg by mouth daily., Disp: , Rfl:  .  Cholecalciferol (VITAMIN D-3 PO), Take 5,000 Units by mouth daily., Disp: , Rfl:  .  diltiazem (CARDIZEM CD) 120 MG 24 hr capsule, Take 1 capsule (120 mg total) by mouth daily., Disp: 30 capsule, Rfl: 12 .  fluticasone (FLONASE) 50 MCG/ACT nasal spray, Place 2 sprays into both nostrils daily., Disp: 16 g, Rfl: 0 .  ibandronate (BONIVA) 150 MG tablet, Take in the morning with a full glass of water, on an empty stomach, and do not take anything else by mouth or lie down for the next 30 min., Disp: 1 tablet, Rfl: 6 .  LUMIGAN 0.01 % SOLN, , Disp: , Rfl:  .  Misc Natural Products (OSTEO BI-FLEX JOINT SHIELD PO), Take 1 tablet by mouth daily., Disp: , Rfl:  .  Omega-3 Krill Oil 300 MG CAPS, Take 1 tablet by mouth daily., Disp: , Rfl:  .  polyethylene glycol powder (GLYCOLAX/MIRALAX) 17 GM/SCOOP powder, Take 17 g by mouth daily., Disp: 527 g, Rfl: 0 .  SUMAtriptan (IMITREX) 100 MG tablet, Take 1 tablet (100 mg total) by mouth as needed., Disp: 9 tablet, Rfl: 5 .  vitamin B-12 (CYANOCOBALAMIN) 1000 MCG tablet, Take by mouth., Disp: , Rfl:   No Known Allergies  I personally reviewed active problem list, medication list, allergies, family history, social  history with the patient/caregiver today.   ROS  Constitutional: Negative for fever or weight change.  Respiratory: Negative for cough and shortness of breath.   Cardiovascular: Negative for chest pain or palpitations.  Gastrointestinal: Negative for abdominal pain, no bowel changes.  Musculoskeletal: Negative for gait problem or joint swelling.  Skin: Negative for rash.  Neurological: Negative for dizziness or headache.  No other specific complaints in a complete review of systems (except as listed in HPI above).  Objective    Vitals:   12/18/19 1458  BP: 130/88  Pulse: (!) 105  Temp: (!) 96.8 F (36 C)  TempSrc: Temporal  SpO2: 96%  Weight: 136 lb 8 oz (61.9 kg)  Height: 5\' 2"  (1.575 m)    Body mass index is 24.97 kg/m.  Physical Exam  Constitutional: Patient  appears well-developed and well-nourished. Obese  No distress.  HEENT: head atraumatic, normocephalic, pupils equal and reactive to light Cardiovascular: Normal rate, regular rhythm and normal heart sounds.  No murmur heard. No BLE edema. Pulmonary/Chest: Effort normal and breath sounds normal. No respiratory distress. Abdominal: Soft.  There is no tenderness. Psychiatric: Patient has a normal mood and affect. behavior is normal. Judgment and thought content normal.  Recent Results (from the past 2160 hour(s))  Novel Coronavirus, NAA ([facility])     Status: None   Collection Time: 11/13/19 11:54 AM   Specimen: Oropharyngeal(OP) collection in vial transport medium   OROPHARYNGEA  TESTING  Result Value Ref Range   SARS-CoV-2, NAA Not Detected Not Detected    Comment: This nucleic acid amplification test was developed and its performance characteristics determined by [employer]. Nucleic acid amplification tests include PCR and TMA. This test has not been FDA cleared or approved. This test has been authorized by FDA under an Emergency Use Authorization (EUA). This test is only authorized for the  duration of time the declaration that circumstances exist justifying the authorization of the emergency use of in vitro diagnostic tests for detection of SARS-CoV-2 virus and/or diagnosis of COVID-19 infection under section 564(b)(1) of the Act, 21 U.S.C. PT:2852782) (1), unless the authorization is terminated or revoked sooner. When diagnostic testing is negative, the possibility of a false negative result should be considered in the context of a patient's recent exposures and the presence of clinical signs and symptoms consistent with COVID-19. An individual without symptoms of COVID-19 and who is not shedding SARS-CoV-2 virus would  expect to have a negative (not detected) result in this assay.       PHQ2/9: Depression screen [facility] 2/9 12/18/2019 12/02/2019 08/18/2019 02/14/2019 02/12/2019  Decreased Interest 0 0 0 0 0  Down, Depressed, Hopeless 0 0 0 0 0  PHQ - 2 Score 0 0 0 0 0  Altered sleeping 0 - 0 0 1  Tired, decreased energy 0 - 0 0 1  Change in appetite 0 - 0 0 0  Feeling bad or failure about yourself  0 - 0 0 0  Trouble concentrating 0 - 0 0 0  Moving slowly or fidgety/restless 0 - 0 0 0  Suicidal thoughts 0 - 0 0 0  PHQ-9 Score 0 - 0 0 2  Difficult doing work/chores - - Somewhat difficult - Not difficult at all  Some recent data might be hidden    phq 9 is negative   Fall Risk: Fall Risk  12/18/2019 12/02/2019 08/18/2019 02/14/2019 02/12/2019  Falls in the past year? 0 0 0 0 0  Number falls in past yr: 0 0 0 0 0  Injury with Fall? 0 0 0 0 0  Risk for fall due to : - No Fall Risks - - -  Follow up - Falls prevention discussed - - -     Functional Status Survey: Is the patient deaf or have difficulty hearing?: No Does the patient have difficulty seeing, even when wearing glasses/contacts?: No Does the patient have difficulty concentrating, remembering, or making decisions?: No Does the patient have difficulty walking or climbing stairs?: No Does the patient have  difficulty dressing or bathing?: No Does the patient have difficulty doing errands alone such as visiting a doctor's office or shopping?: No    Assessment & Plan  1. Enterocele  - Ambulatory referral to Urogynecology  2. Gastroesophageal reflux disease without esophagitis   3. History of esophageal dilatation  Had esophageal dilation and is doing better  4. Dyslipidemia  Doing well with dietary changes  5. Vitamin D deficiency  Continue supplementation   6. Osteopenia, unspecified location   Continue Boniva  7. MVP (mitral valve prolapse)  stable  8. Migraine without aura and without status migrainosus, not intractable  - SUMAtriptan (IMITREX) 100 MG tablet; Take 1 tablet (100 mg total) by mouth as needed.  Dispense: 9 tablet; Refill: 5  9. Chronic constipation  - polyethylene glycol powder (GLYCOLAX/MIRALAX) 17 GM/SCOOP powder; Take 17 g by mouth daily.  Dispense: 527 g; Refill: 0  10. Right lower quadrant pain  - Ambulatory referral to Urogynecology  It may be secondary to scar tissue

## 2019-12-22 ENCOUNTER — Other Ambulatory Visit: Payer: Self-pay | Admitting: Family Medicine

## 2019-12-22 DIAGNOSIS — M8580 Other specified disorders of bone density and structure, unspecified site: Secondary | ICD-10-CM

## 2019-12-22 NOTE — Telephone Encounter (Signed)
Requested Prescriptions  Pending Prescriptions Disp Refills  . ibandronate (BONIVA) 150 MG tablet [Pharmacy Med Name: IBANDRONATE SODIUM 150 MG TAB] 1 tablet 0    Sig: TAKE ONE TABLET BY MOUTH EVERY 30 DAYS     Endocrinology:  Bisphosphonates Passed - 12/22/2019 12:41 PM      Passed - Ca in normal range and within 360 days    Calcium  Date Value Ref Range Status  08/18/2019 9.8 8.6 - 10.4 mg/dL Final         Passed - Vitamin D in normal range and within 360 days    Vit D, 25-Hydroxy  Date Value Ref Range Status  08/18/2019 32 30 - 100 ng/mL Final    Comment:    Vitamin D Status         25-OH Vitamin D: . Deficiency:                    <20 ng/mL Insufficiency:             20 - 29 ng/mL Optimal:                 > or = 30 ng/mL . For 25-OH Vitamin D testing on patients on  D2-supplementation and patients for whom quantitation  of D2 and D3 fractions is required, the [employer](TM) 25-OH VIT D, (D2,D3), LC/MS/MS is recommended: order  code [id number] (patients >11yrs). See Note 1 . Note 1 . For additional information, please refer to  http://education.[facility].com/faq/FAQ199  (This link is being provided for informational/ educational purposes only.)          Passed - Valid encounter within last 12 months    Recent Outpatient Visits          4 days ago [facility] Steele Sizer, MD   4 months ago Dyslipidemia   [facility] Steele Sizer, MD   10 months ago [facility] Steele Sizer, MD   10 months ago Well woman exam   [facility] Steele Sizer, MD   12 months ago Acute non-recurrent frontal sinusitis   Mucarabones, NP      Future Appointments            In 5 months Ancil Boozer, Drue Stager, MD [facility], [place]   In 11 months  [facility], [facility]

## 2020-01-07 ENCOUNTER — Other Ambulatory Visit: Payer: Self-pay | Admitting: General Surgery

## 2020-01-07 DIAGNOSIS — R232 Flushing: Secondary | ICD-10-CM

## 2020-01-07 NOTE — Progress Notes (Signed)
met

## 2020-01-10 ENCOUNTER — Other Ambulatory Visit
Admission: RE | Admit: 2020-01-10 | Discharge: 2020-01-10 | Disposition: A | Discharge status: Home / Self Care | Payer: Medicare HMO | Source: Ambulatory Visit | Attending: General Surgery | Admitting: General Surgery

## 2020-01-10 DIAGNOSIS — R232 Flushing: Secondary | ICD-10-CM

## 2020-01-13 ENCOUNTER — Encounter: Payer: Self-pay | Admitting: Family Medicine

## 2020-01-16 LAB — METANEPHRINES, URINE, 24 HOUR
Metaneph Total, Ur: 54 ug/L
Metanephrines, 24H Ur: 78 ug/24 hr (ref 36–209)
Normetanephrine, 24H Ur: 167 ug/24 hr (ref 131–612)
Normetanephrine, Ur: 115 ug/L
Total Volume: 1450

## 2020-01-17 LAB — CATECHOLAMINES,UR.,FREE,24 HR
Dopamine, Rand Ur: 134 ug/L
Dopamine, Ur, 24Hr: 194 ug/24 hr (ref 0–510)
Epinephrine, Rand Ur: 3 ug/L
Epinephrine, U, 24Hr: 4 ug/24 hr (ref 0–20)
Norepinephrine, Rand Ur: 21 ug/L
Norepinephrine,U,24H: 30 ug/24 hr (ref 0–135)
Total Volume: 1450

## 2020-06-17 ENCOUNTER — Ambulatory Visit: Payer: Medicare HMO | Admitting: Family Medicine

## 2020-07-05 ENCOUNTER — Other Ambulatory Visit: Payer: Self-pay | Admitting: Family Medicine

## 2020-07-05 DIAGNOSIS — Z1231 Encounter for screening mammogram for malignant neoplasm of breast: Secondary | ICD-10-CM

## 2020-07-20 NOTE — Progress Notes (Signed)
Name: Bonnie Owens   MRN: [id number]    DOB: 29-Apr-1951   Date:07/21/2020       Progress Note  Subjective  Chief Complaint  Chief Complaint  Patient presents with   Follow-up    last seen 6 months ago    HPI   Hot flashes: she noticed flushing described as hot sensation from the chest up to her face, and she gets red and pours sweat on her face. The warning side is nausea a few minutes prior to episode. Not associated with vomiting or diarrhea. No fever She had multiple tests done including 5HIAA done in September that were negative and was started on Effexor and improved symptoms slightly, she stopped Effexor because of nausea and since she stopped nausea has improved, explained that tests by GI, ENT, general surgeon  all negative. He states symptoms not as frequent, down from daily to about 3-4 times per week and not as intense.   Enterocele: she has noticed right lower quadrant fullness for months, it is intermittent not associated with change in bowel movements of bladder problems. She has a history of hysterectomy at age 83 and states right ovary removed - complicated surgery, but has left ovary,she also had bladder tech, she has seen  Dr. Nechama Guard and GI : Dr. Gustavo Lah , he recommended exploratory surgery, she saw Dr. Fleet Contras but he recommended watchful waiting. She states the pain improved when she saw ortho and had a steroid injection, the right lower quadrant pain was secondary to nerve pain instead of gastrointestinal problem - seen by Dr. Roland Rack   Osteopenia: with low Vitamin D but last level was at goal, continue Boniva, vitamin D supplementation  Weight loss: she had lost over 16  lbs in the previous year, she states she was on Weight Watchers, currently weight has been stable since January 136 lbs-138 lbs .   History of esophageal dilation: 11/2018, following a GERD appropriate diet ,he takes Tums when she has indigestion or substernal burning, usually happens at night.  She tries not to eat before bed.   Dyslipidemia: improved with weight loss, we will recheck labs   Migraine headaches: she states episodes are about twice a month, , usually right frontal area, described as throbbing , denies phonophobia or photophobia. Imitrex helps alleviate symptoms , symptoms resolves within a couple of hours.She is not sure why is better, used to be weekly   Right medial ankle pain: it happened at the end of July, seen the Podiatrist, had to wear a boot but now only using ice, she still has some swelling and increase in warmth , no redness . She states pain is under control now, only painful when walking/standing more than usual  Patient Active Problem List   Diagnosis Date Noted   Strain of right hip 07/07/2019   Gastritis 11/26/2018   Hiatal hernia 11/26/2018   Flushing 09/04/2018   Palpitations 03/19/2017   History of basal cell carcinoma 11/20/2016   Primary osteoarthritis of left knee 03/09/2016   History of total knee replacement, left 03/09/2016   Osteopenia 11/17/2015   Migraine without aura and without status migrainosus, not intractable 11/17/2015   Urge urinary incontinence 11/17/2015   Dyslipidemia 11/17/2015   Large breasts 11/17/2015   Menopause 11/17/2015   Vitamin D deficiency 11/17/2015   MVP (mitral valve prolapse) 11/17/2015   History of lumbar fusion 11/17/2015   Chronic constipation 11/17/2015   Venous insufficiency 11/17/2015   GERD (gastroesophageal reflux disease) 11/17/2015   Eustachian  tube dysfunction 11/17/2015   Sciatica of left side     Past Surgical History:  Procedure Laterality Date   ABDOMINAL HYSTERECTOMY  1985   APPENDECTOMY     BACK SURGERY     BASAL CELL CARCINOMA EXCISION Left 10/2016   Underneath Left Eye- [facility]- Dr. Nehemiah Massed   COLONOSCOPY N/A 11/14/2017   Procedure: COLONOSCOPY;  Surgeon: Toledo, Benay Pike, MD;  Location: [facility] ENDOSCOPY;  Service: Endoscopy;  Laterality: N/A;    COLONOSCOPY WITH PROPOFOL N/A 09/01/2019   Procedure: COLONOSCOPY WITH PROPOFOL;  Surgeon: Lollie Sails, MD;  Location: [facility] ENDOSCOPY;  Service: Endoscopy;  Laterality: N/A;   ESOPHAGOGASTRODUODENOSCOPY (EGD) WITH PROPOFOL N/A 11/20/2018   Procedure: ESOPHAGOGASTRODUODENOSCOPY (EGD) WITH PROPOFOL;  Surgeon: Toledo, Benay Pike, MD;  Location: [facility] ENDOSCOPY;  Service: Gastroenterology;  Laterality: N/A;   JOINT REPLACEMENT     (LT) TKR 2017   lumbar descectomy     sling procedure     TOTAL KNEE ARTHROPLASTY Left 03/09/2016   Procedure: TOTAL KNEE ARTHROPLASTY;  Surgeon: Hessie Knows, MD;  Location: [facility] ORS;  Service: Orthopedics;  Laterality: Left;   TUBAL LIGATION     tummy tuck      Family History  Problem Relation Age of Onset   Breast cancer Sister 22       twice, both breasts   Graves' disease Sister    Goiter Sister    Dementia Mother    Cancer Father        liver   Thyroid disease Brother    Prostate cancer Brother    Breast cancer Maternal Grandmother        ? age   Dysfunctional uterine bleeding Daughter    Dementia Maternal Grandfather    Cancer Paternal Grandmother     Social History   Tobacco Use   Smoking status: Never Smoker   Smokeless tobacco: Never Used  Substance Use Topics   Alcohol use: Yes    Alcohol/week: 1.0 standard drink    Types: 1 Glasses of wine per week    Comment: weekly     Current Outpatient Medications:    Ascorbic Acid (VITAMIN C PO), Take 1,000 mg by mouth daily., Disp: , Rfl:    aspirin 81 MG tablet, Take 81 mg by mouth daily., Disp: , Rfl:    Cholecalciferol (VITAMIN D-3 PO), Take 5,000 Units by mouth daily., Disp: , Rfl:    diltiazem (CARDIZEM CD) 120 MG 24 hr capsule, Take 1 capsule (120 mg total) by mouth daily., Disp: 30 capsule, Rfl: 12   fluticasone (FLONASE) 50 MCG/ACT nasal spray, Place 2 sprays into both nostrils daily., Disp: 16 g, Rfl: 0   ibandronate (BONIVA) 150 MG tablet, TAKE ONE  TABLET BY MOUTH EVERY 30 DAYS, Disp: 1 tablet, Rfl: 0   LUMIGAN 0.01 % SOLN, , Disp: , Rfl:    Misc Natural Products (OSTEO BI-FLEX JOINT SHIELD PO), Take 1 tablet by mouth daily., Disp: , Rfl:    Omega-3 Krill Oil 300 MG CAPS, Take 1 tablet by mouth daily., Disp: , Rfl:    polyethylene glycol powder (GLYCOLAX/MIRALAX) 17 GM/SCOOP powder, Take 17 g by mouth daily., Disp: 527 g, Rfl: 0   SUMAtriptan (IMITREX) 100 MG tablet, Take 1 tablet (100 mg total) by mouth as needed., Disp: 9 tablet, Rfl: 5   vitamin B-12 (CYANOCOBALAMIN) 1000 MCG tablet, Take by mouth., Disp: , Rfl:    latanoprost (XALATAN) 0.005 % ophthalmic solution, , Disp: , Rfl:   No Known Allergies  I personally reviewed active problem list, medication list, allergies, family history, social history, health maintenance with the patient/caregiver today.   Review of Systems  Constitutional: Negative for chills and fever.  HENT: Negative for congestion and hearing loss.   Eyes: Negative for blurred vision.  Respiratory: Negative for cough.   Cardiovascular: Negative for chest pain and palpitations.  Gastrointestinal: Negative for abdominal pain.  Genitourinary: Negative for dysuria, frequency and urgency.  Musculoskeletal: Negative for myalgias.  Neurological: Negative for dizziness and headaches.  Psychiatric/Behavioral: Negative for suicidal ideas.  All other systems reviewed and are negative.     Objective  Vitals:   07/21/20 0837  BP: 124/84  Pulse: 74  SpO2: 98%  Weight: 138 lb (62.6 kg)  Height: 5\' 2"  (1.575 m)    Body mass index is 25.24 kg/m.  Constitutional: Patient appears well-developed and well-nourished.  No distress.  HEENT: head atraumatic, normocephalic, pupils equal and reactive to light,  neck supple, throat within normal limits Cardiovascular: Normal rate, regular rhythm and normal heart sounds.  No murmur heard. No BLE edema. Pulmonary/Chest: Effort normal and breath sounds normal. No  respiratory distress. Abdominal: Soft.  There is no tenderness. Psychiatric: Patient has a normal mood and affect. behavior is normal. Judgment and thought content normal.   PHQ2/9: Depression screen [facility] 2/9 07/21/2020 12/18/2019 12/02/2019 08/18/2019 02/14/2019  Decreased Interest 0 0 0 0 0  Down, Depressed, Hopeless 0 0 0 0 0  PHQ - 2 Score 0 0 0 0 0  Altered sleeping 0 0 - 0 0  Tired, decreased energy 0 0 - 0 0  Change in appetite 0 0 - 0 0  Feeling bad or failure about yourself  0 0 - 0 0  Trouble concentrating 0 0 - 0 0  Moving slowly or fidgety/restless 0 0 - 0 0  Suicidal thoughts 0 0 - 0 0  PHQ-9 Score 0 0 - 0 0  Difficult doing work/chores - - - Somewhat difficult -  Some recent data might be hidden    phq 9 is negative   Fall Risk: Fall Risk  07/21/2020 12/18/2019 12/02/2019 08/18/2019 02/14/2019  Falls in the past year? 0 0 0 0 0  Number falls in past yr: - 0 0 0 0  Injury with Fall? - 0 0 0 0  Risk for fall due to : - - No Fall Risks - -  Follow up - - Falls prevention discussed - -    Functional Status Survey: Is the patient deaf or have difficulty hearing?: Yes Does the patient have difficulty seeing, even when wearing glasses/contacts?: No Does the patient have difficulty concentrating, remembering, or making decisions?: No Does the patient have difficulty walking or climbing stairs?: No Does the patient have difficulty dressing or bathing?: No Does the patient have difficulty doing errands alone such as visiting a doctor's office or shopping?: No    Assessment & Plan  1. Dyslipidemia  - Lipid panel  2. Gastroesophageal reflux disease without esophagitis  Taking prn tums  3. Vitamin D deficiency  Still taking supplementation   4. Migraine without aura and without status migrainosus, not intractable  - SUMAtriptan (IMITREX) 100 MG tablet; Take 1 tablet (100 mg total) by mouth as needed.  Dispense: 9 tablet; Refill: 5  5. Osteopenia, unspecified  location  - ibandronate (BONIVA) 150 MG tablet; TAKE ONE TABLET BY MOUTH EVERY 30 DAYS  Dispense: 1 tablet; Refill: 12 - DG Bone Density; Future  6. Chronic constipation  7. MVP (mitral valve prolapse)  Sees Dr. Ubaldo Glassing, stable, takes cardizem  8. Long-term use of high-risk medication  - COMPLETE METABOLIC PANEL WITH GFR - CBC with Differential/Platelet  I,Shannon M Levens,acting as a scribe for Loistine Chance, MD.,have documented all relevant documentation on the behalf of Loistine Chance, MD,as directed by  Loistine Chance, MD while in the presence of Loistine Chance, MD.

## 2020-07-21 ENCOUNTER — Ambulatory Visit (INDEPENDENT_AMBULATORY_CARE_PROVIDER_SITE_OTHER): Payer: Medicare HMO | Admitting: Family Medicine

## 2020-07-21 ENCOUNTER — Other Ambulatory Visit: Payer: Self-pay

## 2020-07-21 ENCOUNTER — Encounter: Payer: Self-pay | Admitting: Family Medicine

## 2020-07-21 VITALS — BP 124/84 | HR 74 | Ht 62.0 in | Wt 138.0 lb

## 2020-07-21 DIAGNOSIS — K5909 Other constipation: Secondary | ICD-10-CM

## 2020-07-21 DIAGNOSIS — K219 Gastro-esophageal reflux disease without esophagitis: Secondary | ICD-10-CM

## 2020-07-21 DIAGNOSIS — Z79899 Other long term (current) drug therapy: Secondary | ICD-10-CM

## 2020-07-21 DIAGNOSIS — E785 Hyperlipidemia, unspecified: Secondary | ICD-10-CM | POA: Diagnosis not present

## 2020-07-21 DIAGNOSIS — G43009 Migraine without aura, not intractable, without status migrainosus: Secondary | ICD-10-CM

## 2020-07-21 DIAGNOSIS — E559 Vitamin D deficiency, unspecified: Secondary | ICD-10-CM | POA: Diagnosis not present

## 2020-07-21 DIAGNOSIS — I341 Nonrheumatic mitral (valve) prolapse: Secondary | ICD-10-CM

## 2020-07-21 DIAGNOSIS — M8580 Other specified disorders of bone density and structure, unspecified site: Secondary | ICD-10-CM

## 2020-07-21 LAB — CBC WITH DIFFERENTIAL/PLATELET
Absolute Monocytes: 510 cells/uL (ref 200–950)
Basophils Absolute: 48 cells/uL (ref 0–200)
Basophils Relative: 0.8 %
Eosinophils Absolute: 48 cells/uL (ref 15–500)
Eosinophils Relative: 0.8 %
HCT: 42.4 % (ref 35.0–45.0)
Hemoglobin: 14.1 g/dL (ref 11.7–15.5)
Lymphs Abs: 2190 cells/uL (ref 850–3900)
MCH: 31.7 pg (ref 27.0–33.0)
MCHC: 33.3 g/dL (ref 32.0–36.0)
MCV: 95.3 fL (ref 80.0–100.0)
MPV: 8.7 fL (ref 7.5–12.5)
Monocytes Relative: 8.5 %
Neutro Abs: 3204 cells/uL (ref 1500–7800)
Neutrophils Relative %: 53.4 %
Platelets: 326 10*3/uL (ref 140–400)
RBC: 4.45 10*6/uL (ref 3.80–5.10)
RDW: 12.4 % (ref 11.0–15.0)
Total Lymphocyte: 36.5 %
WBC: 6 10*3/uL (ref 3.8–10.8)

## 2020-07-21 LAB — COMPLETE METABOLIC PANEL WITH GFR
AG Ratio: 1.8 (calc) (ref 1.0–2.5)
ALT: 11 U/L (ref 6–29)
AST: 16 U/L (ref 10–35)
Albumin: 4.4 g/dL (ref 3.6–5.1)
Alkaline phosphatase (APISO): 53 U/L (ref 37–153)
BUN: 13 mg/dL (ref 7–25)
CO2: 30 mmol/L (ref 20–32)
Calcium: 9.4 mg/dL (ref 8.6–10.4)
Chloride: 101 mmol/L (ref 98–110)
Creat: 0.6 mg/dL (ref 0.60–0.93)
GFR, Est African American: 107 mL/min/{1.73_m2} (ref >=60)
GFR, Est Non African American: 92 mL/min/{1.73_m2} (ref >=60)
Globulin: 2.5 g/dL (calc) (ref 1.9–3.7)
Glucose, Bld: 86 mg/dL (ref 65–99)
Potassium: 4.3 mmol/L (ref 3.5–5.3)
Sodium: 139 mmol/L (ref 135–146)
Total Bilirubin: 0.4 mg/dL (ref 0.2–1.2)
Total Protein: 6.9 g/dL (ref 6.1–8.1)

## 2020-07-21 LAB — LIPID PANEL
Cholesterol: 234 mg/dL — ABNORMAL HIGH (ref <200)
HDL: 55 mg/dL (ref >=50)
LDL Cholesterol (Calc): 156 mg/dL (calc) — ABNORMAL HIGH
Non-HDL Cholesterol (Calc): 179 mg/dL (calc) — ABNORMAL HIGH (ref <130)
Total CHOL/HDL Ratio: 4.3 (calc) (ref <5.0)
Triglycerides: 117 mg/dL (ref <150)

## 2020-07-21 MED ORDER — SUMATRIPTAN SUCCINATE 100 MG PO TABS: 100.0000 mg | ORAL_TABLET | ORAL | 5 refills | Status: DC | PRN

## 2020-07-21 MED ORDER — IBANDRONATE SODIUM 150 MG PO TABS: ORAL_TABLET | ORAL | 12 refills | Status: DC

## 2020-07-30 ENCOUNTER — Ambulatory Visit
Admission: RE | Admit: 2020-07-30 | Discharge: 2020-07-30 | Disposition: A | Discharge status: Home / Self Care | Payer: Medicare HMO | Source: Ambulatory Visit | Attending: Family Medicine | Admitting: Family Medicine

## 2020-07-30 ENCOUNTER — Other Ambulatory Visit: Payer: Self-pay

## 2020-07-30 DIAGNOSIS — Z1231 Encounter for screening mammogram for malignant neoplasm of breast: Secondary | ICD-10-CM | POA: Insufficient documentation

## 2020-08-05 ENCOUNTER — Ambulatory Visit
Admission: RE | Admit: 2020-08-05 | Discharge: 2020-08-05 | Disposition: A | Discharge status: Home / Self Care | Payer: Medicare HMO | Source: Ambulatory Visit | Attending: Family Medicine | Admitting: Family Medicine

## 2020-08-05 DIAGNOSIS — Z13820 Encounter for screening for osteoporosis: Secondary | ICD-10-CM | POA: Diagnosis not present

## 2020-08-05 DIAGNOSIS — Z780 Asymptomatic menopausal state: Secondary | ICD-10-CM | POA: Insufficient documentation

## 2020-08-05 DIAGNOSIS — M8580 Other specified disorders of bone density and structure, unspecified site: Secondary | ICD-10-CM | POA: Insufficient documentation

## 2020-08-17 ENCOUNTER — Ambulatory Visit: Payer: Medicare HMO | Attending: Internal Medicine

## 2020-08-17 ENCOUNTER — Other Ambulatory Visit: Payer: Self-pay

## 2020-08-17 DIAGNOSIS — Z23 Encounter for immunization: Secondary | ICD-10-CM

## 2020-08-17 NOTE — Progress Notes (Signed)
   Covid-19 Vaccination Clinic  Name:  Bonnie Owens    MRN: [id number] DOB: 29-Sep-1951  08/17/2020  [name] was observed post Covid-19 immunization for 15 minutes without incident. She was provided with Vaccine Information Sheet and instruction to access the V-Safe system.   [name] was instructed to call 911 with any severe reactions post vaccine: Marland Kitchen Difficulty breathing  . Swelling of face and throat  . A fast heartbeat  . A bad rash all over body  . Dizziness and weakness

## 2020-12-02 ENCOUNTER — Ambulatory Visit: Payer: Medicare HMO

## 2020-12-08 ENCOUNTER — Ambulatory Visit: Payer: Medicare HMO | Admitting: Family Medicine

## 2021-01-19 NOTE — Progress Notes (Unsigned)
Name: Bonnie Owens   MRN: [id number]    DOB: 1951-08-31   Date:01/21/2021       Progress Note  Subjective  Chief Complaint  Follow up   HPI   Hot flashes: she noticed flushing described as hot sensation from the chest up to her face, and she gets red and pours sweat on her face. The warning side is nausea a few minutes prior to episode. Not associated with vomiting or diarrhea. No fever She had multiple tests done including 5HIAA done in September that were negative and was started on Effexor and improved symptoms slightly, she stopped Effexor because of nausea and since she stopped nausea has improved, explained that tests by GI, ENT, general surgeon  all negative. He states symptoms are now usually at night, but not as intense as it used to be and does not last as long, she feels anxious at night. 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 She has a history of hysterectomy at age 56 and states right ovary removed - complicated surgery, but has left ovary,she also had bladder tech, she has seen  Dr. Carlena Hurl and GI : Dr. Gustavo Lah , he recommended exploratory surgery, she saw Dr. Fleet Contras but he recommended watchful waiting. She states the pain improved when she saw ortho and had a steroid injection, the right lower quadrant pain was secondary to nerve pain instead of gastrointestinal problem - seen by Dr. Roland Rack . Unchanged. She states sometimes has fullness on right lower quadrant when she is constipation otherwise she is fine   Osteopenia: with low Vitamin D but last level was at goal, continue Boniva, vitamin D supplementation  Weight loss: she had lost over 16  lbs in 2020 with  [employer], currently weight has been stable since January 2021 weight has been stable. No longer going to [employer] but follow up  their diet    History of esophageal dilation: 11/2018, following a GERD appropriate diet  No dysphagia or choking She eats earlier in the evenings. Only tums prn only, not daily   Dyslipidemia: discussed results, she will continue life style modifications for now   The 10-year ASCVD risk score Mikey Bussing DC Brooke Bonito., et al., 2013) is: 8.5%   Values used to calculate the score:     Age: 70 years     Sex: Female     Is Non-Hispanic African American: No     Diabetic: No     Tobacco smoker: No     Systolic Blood Pressure: 094 mmHg     Is BP treated: No     HDL Cholesterol: 55 mg/dL     Total Cholesterol: 234 mg/dL  Migraine headaches: she states episodes are stable about 2 times a month, usually worse in the Fall .  Usually right frontal area, described as throbbing sometimes sharp , denies phonophobia or photophobia. Imitrex helps alleviate symptoms , symptoms resolves within a couple of hours. She denies side effects of medication   Right medial ankle pain: has an injury at the end of July, a bird bath fell on ankle , she saw  Podiatrist, had to wear a boot but now only using ice, she still has intermittent swelling but stable.   History Left knee partial replacement back in 2017: she went back to Dr. Rudene Christians because of increase in  instability and popping sensation and was diagnosed with hard wear malfunction and was advised to have a revision but wants to hold off   MVP: taking cardizem, she states medication works well, seeing cardiologist - Dr. Ubaldo Glassing yearly, only has palpitations when very tired  Patient Active Problem List   Diagnosis Date Noted  . Strain of right hip 07/07/2019  . Gastritis 11/26/2018  . Hiatal hernia 11/26/2018  . Flushing 09/04/2018  . Palpitations 03/19/2017  . History of basal cell carcinoma 11/20/2016  . Primary osteoarthritis of left knee 03/09/2016  . History of total knee replacement, left 03/09/2016  . Osteopenia 11/17/2015  . Migraine without aura and without  status migrainosus, not intractable 11/17/2015  . Urge urinary incontinence 11/17/2015  . Dyslipidemia 11/17/2015  . Large breasts 11/17/2015  . Menopause 11/17/2015  . Vitamin D deficiency 11/17/2015  . MVP (mitral valve prolapse) 11/17/2015  . History of lumbar fusion 11/17/2015  . Chronic constipation 11/17/2015  . Venous insufficiency 11/17/2015  . GERD (gastroesophageal reflux disease) 11/17/2015  . Eustachian tube dysfunction 11/17/2015  . Sciatica of left side     Past Surgical History:  Procedure Laterality Date  . ABDOMINAL HYSTERECTOMY  1985  . APPENDECTOMY    . BACK SURGERY    . BASAL CELL CARCINOMA EXCISION Left 10/2016   Underneath Left Eye- [facility]- Dr. Nehemiah Massed  . COLONOSCOPY N/A 11/14/2017   Procedure: COLONOSCOPY;  Surgeon: Toledo, Benay Pike, MD;  Location: [facility] ENDOSCOPY;  Service: Endoscopy;  Laterality: N/A;  . COLONOSCOPY WITH PROPOFOL N/A 09/01/2019   Procedure: COLONOSCOPY WITH PROPOFOL;  Surgeon: Lollie Sails, MD;  Location: [facility] ENDOSCOPY;  Service: Endoscopy;  Laterality: N/A;  . ESOPHAGOGASTRODUODENOSCOPY (EGD) WITH PROPOFOL N/A 11/20/2018   Procedure: ESOPHAGOGASTRODUODENOSCOPY (EGD) WITH PROPOFOL;  Surgeon: Toledo, Benay Pike, MD;  Location: [facility] ENDOSCOPY;  Service: Gastroenterology;  Laterality: N/A;  . JOINT REPLACEMENT     (LT) TKR 2017  . lumbar descectomy    . sling procedure    . TOTAL KNEE ARTHROPLASTY Left 03/09/2016   Procedure: TOTAL KNEE ARTHROPLASTY;  Surgeon: Hessie Knows, MD;  Location: [facility] ORS;  Service: Orthopedics;  Laterality: Left;  . TUBAL LIGATION    . tummy tuck      Family History  Problem Relation Age of Onset  . Breast cancer Sister 70       twice, both breasts  . Graves' disease Sister   . Goiter Sister   . Dementia Mother   . Cancer Father        liver  . Thyroid disease Brother   . Prostate cancer Brother   . Breast cancer Maternal Grandmother        ? age  . Dysfunctional uterine bleeding Daughter    . Dementia Maternal Grandfather   . Cancer Paternal Grandmother     Social History   Tobacco Use  . Smoking status: Never Smoker  . Smokeless tobacco: Never Used  Substance Use Topics  . Alcohol use: Yes    Alcohol/week: 1.0 standard drink    Types: 1 Glasses of wine per week    Comment: weekly     Current Outpatient Medications:  .  Ascorbic Acid (VITAMIN C PO), Take 1,000 mg by mouth daily., Disp: , Rfl:  .  aspirin 81 MG tablet, Take 81 mg by mouth daily., Disp: , Rfl:  .  Cholecalciferol (VITAMIN D-3 PO), Take 5,000 Units by mouth daily., Disp: , Rfl:  .  diltiazem (CARDIZEM CD) 120 MG 24  hr capsule, Take 1 capsule (120 mg total) by mouth daily., Disp: 30 capsule, Rfl: 12 .  fluticasone (FLONASE) 50 MCG/ACT nasal spray, Place 2 sprays into both nostrils daily., Disp: 16 g, Rfl: 0 .  hydrOXYzine (ATARAX/VISTARIL) 10 MG tablet, Take 1-2 tablets (10-20 mg total) by mouth at bedtime., Disp: 90 tablet, Rfl: 0 .  ibandronate (BONIVA) 150 MG tablet, TAKE ONE TABLET BY MOUTH EVERY 30 DAYS, Disp: 1 tablet, Rfl: 12 .  latanoprost (XALATAN) 0.005 % ophthalmic solution, , Disp: , Rfl:  .  LUMIGAN 0.01 % SOLN, , Disp: , Rfl:  .  Misc Natural Products (OSTEO BI-FLEX JOINT SHIELD PO), Take 1 tablet by mouth daily., Disp: , Rfl:  .  Omega-3 Krill Oil 300 MG CAPS, Take 1 tablet by mouth daily., Disp: , Rfl:  .  polyethylene glycol powder (GLYCOLAX/MIRALAX) 17 GM/SCOOP powder, Take 17 g by mouth daily., Disp: 527 g, Rfl: 0 .  SUMAtriptan (IMITREX) 100 MG tablet, Take 1 tablet (100 mg total) by mouth as needed., Disp: 9 tablet, Rfl: 5 .  vitamin B-12 (CYANOCOBALAMIN) 1000 MCG tablet, Take by mouth., Disp: , Rfl:   No Known Allergies  I personally reviewed active problem list, medication list, allergies, family history, social history, health maintenance with the patient/caregiver today.   ROS  Constitutional: Negative for fever or weight change.  Respiratory: Negative for cough and  shortness of breath.   Cardiovascular: Negative for chest pain or palpitations.  Gastrointestinal: Negative for abdominal pain, no bowel changes.  Musculoskeletal: Negative for gait problem but has intermittent left knee  joint swelling.  Skin: Negative for rash.  Neurological: Negative for dizziness , positive for  headache.  No other specific complaints in a complete review of systems (except as listed in HPI above).  Objective  Vitals:   01/21/21 0936  BP: 118/70  Pulse: 89  Resp: 16  Temp: 98 F (36.7 C)  TempSrc: Oral  SpO2: 100%  Weight: 139 lb 9.6 oz (63.3 kg)  Height: 5\' 2"  (1.575 m)    Body mass index is 25.53 kg/m.  Physical Exam  Constitutional: Patient appears well-developed and well-nourished. Obese No distress.  HEENT: head atraumatic, normocephalic, pupils equal and reactive to light,  neck supple Cardiovascular: Normal rate, regular rhythm and normal heart sounds.  No murmur heard. No BLE edema. Pulmonary/Chest: Effort normal and breath sounds normal. No respiratory distress. Abdominal: Soft.  There is no tenderness. Muscular skeletal: crepitus with extension of left knee  Psychiatric: Patient has a normal mood and affect. behavior is normal. Judgment and thought content normal.  PHQ2/9: Depression screen [facility] 2/9 01/21/2021 07/21/2020 12/18/2019 12/02/2019 08/18/2019  Decreased Interest 0 0 0 0 0  Down, Depressed, Hopeless 0 0 0 0 0  PHQ - 2 Score 0 0 0 0 0  Altered sleeping - 0 0 - 0  Tired, decreased energy - 0 0 - 0  Change in appetite - 0 0 - 0  Feeling bad or failure about yourself  - 0 0 - 0  Trouble concentrating - 0 0 - 0  Moving slowly or fidgety/restless - 0 0 - 0  Suicidal thoughts - 0 0 - 0  PHQ-9 Score - 0 0 - 0  Difficult doing work/chores - - - - Somewhat difficult  Some recent data might be hidden    phq 9 is negative   Fall Risk: Fall Risk  01/21/2021 07/21/2020 12/18/2019 12/02/2019 08/18/2019  Falls in the past year? 0 0 0 0  0   Number falls in past yr: 0 - 0 0 0  Injury with Fall? 0 - 0 0 0  Risk for fall due to : - - - No Fall Risks -  Follow up - - - Falls prevention discussed -     Functional Status Survey: Is the patient deaf or have difficulty hearing?: No Does the patient have difficulty seeing, even when wearing glasses/contacts?: No Does the patient have difficulty concentrating, remembering, or making decisions?: No Does the patient have difficulty walking or climbing stairs?: No Does the patient have difficulty dressing or bathing?: No Does the patient have difficulty doing errands alone such as visiting a doctor's office or shopping?: No    Assessment & Plan  1. Dyslipidemia  On life style modification   2. Gastroesophageal reflux disease without esophagitis  Stable   3. Vitamin D deficiency  Continue vitamin D supplementation   4. Need for immunization against influenza  - Flu Vaccine QUAD High Dose(Fluad)  5. Migraine without aura and without status migrainosus, not intractable  Stable   6. Chronic constipation  taking miralax a few times a week and doing well   7. MVP (mitral valve prolapse)  Stable on cardizem   8. Insomnia, unspecified type  She seems anxious at night, worried about hot flashes and inability to sleep  - hydrOXYzine (ATARAX/VISTARIL) 10 MG tablet; Take 1-2 tablets (10-20 mg total) by mouth at bedtime.  Dispense: 90 tablet; Refill: 0

## 2021-01-21 ENCOUNTER — Encounter: Payer: Self-pay | Admitting: Family Medicine

## 2021-01-21 ENCOUNTER — Ambulatory Visit (INDEPENDENT_AMBULATORY_CARE_PROVIDER_SITE_OTHER): Payer: Medicare HMO | Admitting: Family Medicine

## 2021-01-21 ENCOUNTER — Other Ambulatory Visit: Payer: Self-pay

## 2021-01-21 VITALS — BP 118/70 | HR 89 | Temp 98.0°F | Resp 16 | Ht 62.0 in | Wt 139.6 lb

## 2021-01-21 DIAGNOSIS — E559 Vitamin D deficiency, unspecified: Secondary | ICD-10-CM

## 2021-01-21 DIAGNOSIS — E785 Hyperlipidemia, unspecified: Secondary | ICD-10-CM

## 2021-01-21 DIAGNOSIS — K5909 Other constipation: Secondary | ICD-10-CM

## 2021-01-21 DIAGNOSIS — G4700 Insomnia, unspecified: Secondary | ICD-10-CM

## 2021-01-21 DIAGNOSIS — K219 Gastro-esophageal reflux disease without esophagitis: Secondary | ICD-10-CM

## 2021-01-21 DIAGNOSIS — G43009 Migraine without aura, not intractable, without status migrainosus: Secondary | ICD-10-CM

## 2021-01-21 DIAGNOSIS — Z23 Encounter for immunization: Secondary | ICD-10-CM | POA: Diagnosis not present

## 2021-01-21 DIAGNOSIS — I341 Nonrheumatic mitral (valve) prolapse: Secondary | ICD-10-CM

## 2021-01-21 MED ORDER — HYDROXYZINE HCL 10 MG PO TABS: 10.0000 mg | ORAL_TABLET | Freq: Every evening | ORAL | 0 refills | Status: DC

## 2021-01-25 ENCOUNTER — Ambulatory Visit (INDEPENDENT_AMBULATORY_CARE_PROVIDER_SITE_OTHER): Payer: Medicare HMO

## 2021-01-25 DIAGNOSIS — Z0000 Encounter for general adult medical examination without abnormal findings: Secondary | ICD-10-CM | POA: Diagnosis not present

## 2021-01-25 NOTE — Patient Instructions (Signed)
Bonnie Owens , Thank you for taking time to come for your Medicare Wellness Visit. I appreciate your ongoing commitment to your health goals. Please review the following plan we discussed and let me know if I can assist you in the future.   Screening recommendations/referrals: Colonoscopy: done 09/01/19 Mammogram: done 07/30/20 Bone Density: done 08/05/20 Recommended yearly ophthalmology/optometry visit for glaucoma screening and checkup Recommended yearly dental visit for hygiene and checkup  Vaccinations: Influenza vaccine: done 01/21/21 Pneumococcal vaccine: done 11/20/16 Tdap vaccine: due Shingles vaccine: done 09/14/17 & 12/13/17   Covid-19: done 01/15/20, 02/05/20 & 08/17/20  Conditions/risks identified: Recommend increasing physical activity   Next appointment: Follow up in one year for your annual wellness visit    Preventive Care 46 Years and Older, Female Preventive care refers to lifestyle choices and visits with your health care provider that can promote health and wellness. What does preventive care include?  A yearly physical exam. This is also called an annual well check.  Dental exams once or twice a year.  Routine eye exams. Ask your health care provider how often you should have your eyes checked.  Personal lifestyle choices, including:  Daily care of your teeth and gums.  Regular physical activity.  Eating a healthy diet.  Avoiding tobacco and drug use.  Limiting alcohol use.  Practicing safe sex.  Taking low-dose aspirin every day.  Taking vitamin and mineral supplements as recommended by your health care provider. What happens during an annual well check? The services and screenings done by your health care provider during your annual well check will depend on your age, overall health, lifestyle risk factors, and family history of disease. Counseling  Your health care provider may ask you questions about your:  Alcohol use.  Tobacco use.  Drug  use.  Emotional well-being.  Home and relationship well-being.  Sexual activity.  Eating habits.  History of falls.  Memory and ability to understand (cognition).  Work and work Statistician.  Reproductive health. Screening  You may have the following tests or measurements:  Height, weight, and BMI.  Blood pressure.  Lipid and cholesterol levels. These may be checked every 5 years, or more frequently if you are over 44 years old.  Skin check.  Lung cancer screening. You may have this screening every year starting at age 59 if you have a 30-pack-year history of smoking and currently smoke or have quit within the past 15 years.  Fecal occult blood test (FOBT) of the stool. You may have this test every year starting at age 79.  Flexible sigmoidoscopy or colonoscopy. You may have a sigmoidoscopy every 5 years or a colonoscopy every 10 years starting at age 64.  Hepatitis C blood test.  Hepatitis B blood test.  Sexually transmitted disease (STD) testing.  Diabetes screening. This is done by checking your blood sugar (glucose) after you have not eaten for a while (fasting). You may have this done every 1-3 years.  Bone density scan. This is done to screen for osteoporosis. You may have this done starting at age 40.  Mammogram. This may be done every 1-2 years. Talk to your health care provider about how often you should have regular mammograms. Talk with your health care provider about your test results, treatment options, and if necessary, the need for more tests. Vaccines  Your health care provider may recommend certain vaccines, such as:  Influenza vaccine. This is recommended every year.  Tetanus, diphtheria, and acellular pertussis (Tdap, Td) vaccine. You may  need a Td booster every 10 years.  Zoster vaccine. You may need this after age 86.  Pneumococcal 13-valent conjugate (PCV13) vaccine. One dose is recommended after age 5.  Pneumococcal polysaccharide  (PPSV23) vaccine. One dose is recommended after age 32. Talk to your health care provider about which screenings and vaccines you need and how often you need them. This information is not intended to replace advice given to you by your health care provider. Make sure you discuss any questions you have with your health care provider. Document Released: 12/17/2015 Document Revised: 08/09/2016 Document Reviewed: 09/21/2015 Elsevier Interactive Patient Education  2017 [place] Prevention in the Home Falls can cause injuries. They can happen to people of all ages. There are many things you can do to make your home safe and to help prevent falls. What can I do on the outside of my home?  Regularly fix the edges of walkways and driveways and fix any cracks.  Remove anything that might make you trip as you walk through a door, such as a raised step or threshold.  Trim any bushes or trees on the path to your home.  Use bright outdoor lighting.  Clear any walking paths of anything that might make someone trip, such as rocks or tools.  Regularly check to see if handrails are loose or broken. Make sure that both sides of any steps have handrails.  Any raised decks and porches should have guardrails on the edges.  Have any leaves, snow, or ice cleared regularly.  Use sand or salt on walking paths during winter.  Clean up any spills in your garage right away. This includes oil or grease spills. What can I do in the bathroom?  Use night lights.  Install grab bars by the toilet and in the tub and shower. Do not use towel bars as grab bars.  Use non-skid mats or decals in the tub or shower.  If you need to sit down in the shower, use a plastic, non-slip stool.  Keep the floor dry. Clean up any water that spills on the floor as soon as it happens.  Remove soap buildup in the tub or shower regularly.  Attach bath mats securely with double-sided non-slip rug tape.  Do not have  throw rugs and other things on the floor that can make you trip. What can I do in the bedroom?  Use night lights.  Make sure that you have a light by your bed that is easy to reach.  Do not use any sheets or blankets that are too big for your bed. They should not hang down onto the floor.  Have a firm chair that has side arms. You can use this for support while you get dressed.  Do not have throw rugs and other things on the floor that can make you trip. What can I do in the kitchen?  Clean up any spills right away.  Avoid walking on wet floors.  Keep items that you use a lot in easy-to-reach places.  If you need to reach something above you, use a strong step stool that has a grab bar.  Keep electrical cords out of the way.  Do not use floor polish or wax that makes floors slippery. If you must use wax, use non-skid floor wax.  Do not have throw rugs and other things on the floor that can make you trip. What can I do with my stairs?  Do not leave any items on  the stairs.  Make sure that there are handrails on both sides of the stairs and use them. Fix handrails that are broken or loose. Make sure that handrails are as long as the stairways.  Check any carpeting to make sure that it is firmly attached to the stairs. Fix any carpet that is loose or worn.  Avoid having throw rugs at the top or bottom of the stairs. If you do have throw rugs, attach them to the floor with carpet tape.  Make sure that you have a light switch at the top of the stairs and the bottom of the stairs. If you do not have them, ask someone to add them for you. What else can I do to help prevent falls?  Wear shoes that:  Do not have high heels.  Have rubber bottoms.  Are comfortable and fit you well.  Are closed at the toe. Do not wear sandals.  If you use a stepladder:  Make sure that it is fully opened. Do not climb a closed stepladder.  Make sure that both sides of the stepladder are  locked into place.  Ask someone to hold it for you, if possible.  Clearly mark and make sure that you can see:  Any grab bars or handrails.  First and last steps.  Where the edge of each step is.  Use tools that help you move around (mobility aids) if they are needed. These include:  Canes.  Walkers.  Scooters.  Crutches.  Turn on the lights when you go into a dark area. Replace any light bulbs as soon as they burn out.  Set up your furniture so you have a clear path. Avoid moving your furniture around.  If any of your floors are uneven, fix them.  If there are any pets around you, be aware of where they are.  Review your medicines with your doctor. Some medicines can make you feel dizzy. This can increase your chance of falling. Ask your doctor what other things that you can do to help prevent falls. This information is not intended to replace advice given to you by your health care provider. Make sure you discuss any questions you have with your health care provider. Document Released: 09/16/2009 Document Revised: 04/27/2016 Document Reviewed: 12/25/2014 Elsevier Interactive Patient Education  2017 [employer].

## 2021-01-25 NOTE — Progress Notes (Signed)
Subjective:   Bonnie Owens is a 70 y.o. female who presents for Medicare Annual (Subsequent) preventive examination.  Virtual Visit via Telephone Note  I connected with  Bonnie Owens on 01/25/21 at  9:20 AM EST by telephone and verified that I am speaking with the correct person using two identifiers.  Location: Patient: home Provider: [place] Persons participating in the virtual visit: Bonnie Owens   I discussed the limitations, risks, security and privacy concerns of performing an evaluation and management service by telephone and the availability of in person appointments. The patient expressed understanding and agreed to proceed.  Interactive audio and video telecommunications were attempted between this nurse and patient, however failed, due to patient having technical difficulties OR patient did not have access to video capability.  We continued and completed visit with audio only.  Some vital signs may be absent or patient reported.   Bonnie Marker, LPN    Review of Systems     Cardiac Risk Factors include: advanced age (>66men, >82 women);hypertension     Objective:    There were no vitals filed for this visit. There is no height or weight on file to calculate BMI.  Advanced Directives 01/25/2021 12/02/2019 09/01/2019 11/29/2018 11/20/2018 11/20/2018 05/14/2017  Does Patient Have a Medical Advance Directive? Yes Yes Yes Yes Yes No;Yes No  Type of Paramedic of [place];Living will [place];Living will - [place];Living will - Out of facility DNR (pink MOST or yellow form) -  Does patient want to make changes to medical advance directive? - - - - - - -  Copy of [place] in Chart? Yes - validated most recent copy scanned in chart (See row information) Yes - validated most recent copy scanned in chart (See row information) - Yes - validated most recent copy scanned in  chart (See row information) - - -  Would patient like information on creating a medical advance directive? - - - - - - -    Current Medications (verified) Outpatient Encounter Medications as of 01/25/2021  Medication Sig  . Ascorbic Acid (VITAMIN C PO) Take 1,000 mg by mouth daily.  Marland Kitchen aspirin 81 MG tablet Take 81 mg by mouth daily.  . Cholecalciferol (VITAMIN D-3 PO) Take 5,000 Units by mouth daily.  Marland Kitchen diltiazem (CARDIZEM CD) 120 MG 24 hr capsule Take 1 capsule (120 mg total) by mouth daily.  . fluticasone (FLONASE) 50 MCG/ACT nasal spray Place 2 sprays into both nostrils daily.  . hydrOXYzine (ATARAX/VISTARIL) 10 MG tablet Take 1-2 tablets (10-20 mg total) by mouth at bedtime.  . ibandronate (BONIVA) 150 MG tablet TAKE ONE TABLET BY MOUTH EVERY 30 DAYS  . latanoprost (XALATAN) 0.005 % ophthalmic solution   . LUMIGAN 0.01 % SOLN Place into both eyes at bedtime.  . Misc Natural Products (OSTEO BI-FLEX JOINT SHIELD PO) Take 1 tablet by mouth daily.  . Omega-3 Krill Oil 300 MG CAPS Take 1 tablet by mouth daily.  . polyethylene glycol powder (GLYCOLAX/MIRALAX) 17 GM/SCOOP powder Take 17 g by mouth daily.  . SUMAtriptan (IMITREX) 100 MG tablet Take 1 tablet (100 mg total) by mouth as needed.  . vitamin B-12 (CYANOCOBALAMIN) 1000 MCG tablet Take by mouth.   No facility-administered encounter medications on file as of 01/25/2021.    Allergies (verified) Patient has no known allergies.   History: Past Medical History:  Diagnosis Date  . Anemia   . Anginal pain ([place])   .  Chronic constipation   . Complication of anesthesia    Bad headache when waking  . Depression   . Dyspnea   . GERD (gastroesophageal reflux disease)   . Glaucoma   . Headache    occasional migraine  . Hyperlipidemia   . Left knee pain   . Mitral valve prolapse   . Osteopenia   . Urine incontinence   . Venous (peripheral) insufficiency   . Vitamin D deficiency    Past Surgical History:  Procedure Laterality  Date  . ABDOMINAL HYSTERECTOMY  1985  . APPENDECTOMY    . BACK SURGERY    . BASAL CELL CARCINOMA EXCISION Left 10/2016   Underneath Left Eye- [facility]- Dr. Nehemiah Massed  . COLONOSCOPY N/A 11/14/2017   Procedure: COLONOSCOPY;  Surgeon: Toledo, Benay Pike, MD;  Location: [facility] ENDOSCOPY;  Service: Endoscopy;  Laterality: N/A;  . COLONOSCOPY WITH PROPOFOL N/A 09/01/2019   Procedure: COLONOSCOPY WITH PROPOFOL;  Surgeon: Lollie Sails, MD;  [facility] END[facility]COPY;  Service: Endoscopy;  Laterality: N/A;  . ESOPHAGOGASTRODUODENOSCOPY (EGD) WITH PROPOFOL N/A 11/20/2018   Procedure: ESOPHAGOGASTRODUODENOSCOPY (EGD) WITH PROPOFOL;  Surgeon: Toledo, Benay Pike, MD;  Location: [facility] ENDOSCOPY;  Service: Gastroenterology;  Laterality: N/A;  . JOINT REPLACEMENT     (LT) TKR 2017  . lumbar descectomy    . sling procedure    . TOTAL KNEE ARTHROPLASTY Left 03/09/2016   Procedure: TOTAL KNEE ARTHROPLASTY;  Surgeon: Hessie Knows, MD;  Location: [facility] ORS;  Service: Orthopedics;  Laterality: Left;  . TUBAL LIGATION    . tummy tuck     Family History  Problem Relation Age of Onset  . Breast cancer Sister 70       twice, both breasts  . Graves' disease Sister   . Goiter Sister   . Dementia Mother   . Cancer Father        liver  . Thyroid disease Brother   . Prostate cancer Brother   . Breast cancer Maternal Grandmother        ? age  . Dysfunctional uterine bleeding Bonnie Owens   . Dementia Maternal Grandfather   . Cancer Paternal Grandmother    Social History   Socioeconomic History  . Marital status: Married    Spouse name: Bonnie Owens   . Number of children: 2  . Years of education: Not on file  . Highest education level: 12th grade  Occupational History  . Occupation: Estate agent. 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 Smoking status: Never Smoker  . Smokeless tobacco: Never Used  Vaping Use  . Vaping Use: Never used  Substance and Sexual Activity  . Alcohol use: Yes     Alcohol/week: 1.0 standard drink    Types: 1 Glasses of wine per week    Comment: weekly  . Drug use: Never  . Sexual activity: Yes    Partners: Male    Birth control/protection: Surgical, Post-menopausal  Other Topics Concern  . Not on file  Social History Narrative   Husband is a Engineer, agricultural    She has 3 grandson.    Social Determinants of Health   Financial Resource Strain: Low Risk   . Difficulty of Paying Living Expenses: Not hard at all  Food Insecurity: No Food Insecurity  . Worried About Charity fundraiser in the Last Year: Never true  . Ran Out of Food in the Last Year: Never true  Transportation Needs: No Transportation Needs  . Lack  of Transportation (Medical): No  . Lack of Transportation (Non-Medical): No  Physical Activity: Inactive  . Days of Exercise per Week: 0 days  . Minutes of Exercise per Session: 0 min  Stress: No Stress Concern Present  . Feeling of Stress : Only a little  Social Connections: Moderately Integrated  . Frequency of Communication with Friends and Family: More than three times a week  . Frequency of Social Gatherings with Friends and Family: Twice a week  . Attends Religious Services: More than 4 times per year  . Active Member of Clubs or Organizations: No  . Attends Archivist Meetings: Never  . Marital Status: Married    Tobacco Counseling Counseling given: Not Answered   Clinical Intake:  Pre-visit preparation completed: Yes  Pain : No/denies pain     Nutritional Risks: None Diabetes: No  How often do you need to have someone help you when you read instructions, pamphlets, or other written materials from your doctor or pharmacy?: 1 - Never    Interpreter Needed?: No  Information entered by :: Bonnie Marker LPN   Activities of Daily Living In your present state of health, do you have any difficulty performing the following activities: 01/25/2021 01/21/2021  Hearing? N N  Comment declines hearing aids -   Vision? N N  Difficulty concentrating or making decisions? N N  Walking or climbing stairs? N N  Dressing or bathing? N N  Doing errands, shopping? N N  Preparing Food and eating ? N -  Using the Toilet? N -  In the past six months, have you accidently leaked urine? N -  Do you have problems with loss of bowel control? N -  Managing your Medications? N -  Managing your Finances? N -  Housekeeping or managing your Housekeeping? N -  Some recent data might be hidden    Patient Care Team: Steele Sizer, MD as PCP - General (Family Medicine) Ubaldo Glassing Javier Docker, MD as Consulting Physician (Cardiology) Efrain Sella, MD as Consulting Physician (Gastroenterology) Hessie Knows, MD as Consulting Physician (Orthopedic Surgery)  Indicate any recent Medical Services you may have received from other than [facility] providers in the past year (date may be approximate).     Assessment:   This is a routine wellness examination for Brannon.  Hearing/Vision screen  Hearing Screening   125Hz  250Hz  500Hz  1000Hz  2000Hz  3000Hz  4000Hz  6000Hz  8000Hz   Right ear:           Left ear:           Comments: Pt states mild hearing difficulty in left ear does not require hearing aids; sees Dr. Tami Ribas  Vision Screening Comments: Annual vision screenings done at [facility] issues and exercise activities discussed: Current Exercise Habits: The patient does not participate in regular exercise at present, Exercise limited by: None identified  Goals    . Increase physical activity     Recommend increasing physical activity to 150 minutes per week.       Depression Screen PHQ 2/9 Scores 01/25/2021 01/21/2021 07/21/2020 12/18/2019 12/02/2019 08/18/2019 02/14/2019  PHQ - 2 Score 0 0 0 0 0 0 0  PHQ- 9 Score - - 0 0 - 0 0    Fall Risk Fall Risk  01/25/2021 01/21/2021 07/21/2020 12/18/2019 12/02/2019  Falls in the past year? 0 0 0 0 0  Number falls in past yr: 0 0 - 0 0  Injury with Fall? 0 0 - 0 0  Risk for fall due to : No Fall Risks - - - No Fall Risks  Follow up Falls prevention discussed - - - Falls prevention discussed    FALL RISK PREVENTION PERTAINING TO THE HOME:  Any stairs in or around the home? Yes  If so, are there any without handrails? No  Home free of loose throw rugs in walkways, pet beds, electrical cords, etc? Yes  Adequate lighting in your home to reduce risk of falls? Yes   ASSISTIVE DEVICES UTILIZED TO PREVENT FALLS:  Life alert? No  Use of a cane, walker or w/c? No  Grab bars in the bathroom? No  Shower chair or bench in shower? No  Elevated toilet seat or a handicapped toilet? No   TIMED UP AND GO:  Was the test performed? No . Telephonic visit.   Cognitive Function: Normal cognitive status assessed by direct observation by this Nurse Health Advisor. No abnormalities found.       6CIT Screen 11/29/2018  What Year? 0 points  What month? 0 points  What time? 0 points  Count back from 20 0 points  Months in reverse 2 points  Repeat phrase 2 points  Total Score 4    Immunizations Immunization History  Administered Date(s) Administered  . Fluad Quad(high Dose 65+) 08/18/2019, 01/21/2021  . Influenza Split 10/03/2012  . Influenza, High Dose Seasonal PF 08/16/2018  . Influenza, Seasonal, Injecte, Preservative Fre 10/02/2011  . Influenza,inj,Quad PF,6+ Mos 08/24/2014, 09/16/2015, 09/29/2016  . Influenza-Unspecified 08/24/2014, 09/14/2017  . PFIZER(Purple Top)SARS-COV-2 Vaccination 01/15/2020, 02/05/2020, 08/17/2020  . Pneumococcal Conjugate-13 11/20/2016  . Pneumococcal Polysaccharide-23 04/14/2015  . Tdap 05/16/2010  . Zoster 10/02/2011  . Zoster Recombinat (Shingrix) 09/14/2017, 12/13/2017    TDAP status: Due, Education has been provided regarding the importance of this vaccine. Advised may receive this vaccine at local pharmacy or Health Dept. Aware to provide a copy of the vaccination record if obtained from local pharmacy or Health  Dept. Verbalized acceptance and understanding.  Flu Vaccine status: Up to date  Pneumococcal vaccine status: Up to date  Covid-19 vaccine status: Completed vaccines  Qualifies for Shingles Vaccine? Yes   Zostavax completed Yes   Shingrix Completed?: Yes  Screening Tests Health Maintenance  Topic Date Due  . TETANUS/TDAP  05/16/2020  . COVID-19 Vaccine (4 - Booster for Pfizer series) 02/14/2021  . MAMMOGRAM  07/30/2022  . COLONOSCOPY (Pts 45-19yrs Insurance coverage will need to be confirmed)  08/31/2029  . INFLUENZA VACCINE  Completed  . DEXA SCAN  Completed  . Hepatitis C Screening  Completed  . PNA vac Low Risk Adult  Completed    Health Maintenance  Health Maintenance Due  Topic Date Due  . TETANUS/TDAP  05/16/2020    Colorectal cancer screening: Type of screening: Colonoscopy. Completed 09/01/19. Repeat every 10 years  Mammogram status: Completed 07/30/20. Repeat every year  Bone Density status: Completed 08/05/20. Results reflect: Bone density results: OSTEOPENIA. Repeat every 2 years.  Lung Cancer Screening: (Low Dose CT Chest recommended if Age 71-80 years, 30 pack-year currently smoking OR have quit w/in 15years.) does not qualify.   Additional Screening:  Hepatitis C Screening: does qualify; Completed 11/17/15  Vision Screening: Recommended annual ophthalmology exams for early detection of glaucoma and other disorders of the eye. Is the patient up to date with their annual eye exam?  Yes  Who is the provider or what is the name of the office in which the patient attends annual eye exams? Dr. Matilde Sprang  Dental  Screening: Recommended annual dental exams for proper oral hygiene  Community Resource Referral / Chronic Care Management: CRR required this visit?  No   CCM required this visit?  No      Plan:     I have personally reviewed and noted the following in the patient's chart:   . Medical and social history . Use of alcohol, tobacco or illicit drugs   . Current medications and supplements . Functional ability and status . Nutritional status . Physical activity . Advanced directives . List of other physicians . Hospitalizations, surgeries, and ER visits in previous 12 months . Vitals . Screenings to include cognitive, depression, and falls . Referrals and appointments  In addition, I have reviewed and discussed with patient certain preventive protocols, quality metrics, and best practice recommendations. A written personalized care plan for preventive services as well as general preventive health recommendations were provided to patient.     Bonnie Marker, LPN   [id number]   Nurse Notes: none

## 2021-02-08 ENCOUNTER — Other Ambulatory Visit: Payer: Self-pay

## 2021-02-08 ENCOUNTER — Other Ambulatory Visit: Payer: Self-pay | Admitting: Family Medicine

## 2021-02-08 DIAGNOSIS — K5909 Other constipation: Secondary | ICD-10-CM

## 2021-02-08 NOTE — Telephone Encounter (Signed)
Requested Prescriptions  Pending Prescriptions Disp Refills  . Polyethylene Glycol 3350 (PEG 3350) 17 GM/SCOOP POWD [Pharmacy Med Name: POLYETHYLENE GLYCOL 3350 POWD] 527 g 0    Sig: Take 17 g by mouth daily.     Gastroenterology:  Laxatives Passed - 02/08/2021 10:57 AM      Passed - Valid encounter within last 12 months    Recent Outpatient Visits          2 weeks ago Dyslipidemia   [facility] Steele Sizer, MD   6 months ago Dyslipidemia   [facility] Steele Sizer, MD   1 year ago [facility] Steele Sizer, MD   1 year ago Dyslipidemia   [facility] Steele Sizer, MD   1 year ago [facility] Steele Sizer, MD      Future Appointments            In 4 months Ancil Boozer, Drue Stager, MD [facility], [place]   In 5 months Steele Sizer, MD [facility], [place]   In 11 months  [facility], [facility]

## 2021-06-20 NOTE — Progress Notes (Signed)
Name: Bonnie Owens   MRN: [id number]    DOB: 05-13-1951   Date:06/21/2021       Progress Note  Subjective  Chief Complaint  Annual Exam  HPI  Patient presents for annual CPE.  Diet: eats a balanced diet  Exercise: continue regular physical activity    [place] Office Visit from 12/18/2019 in [facility]  AUDIT-C Score 0      Depression: Phq 9 is  negative Depression screen [facility] 2/9 06/21/2021 01/25/2021 01/21/2021 07/21/2020 12/18/2019  Decreased Interest 0 0 0 0 0  Down, Depressed, Hopeless 0 0 0 0 0  PHQ - 2 Score 0 0 0 0 0  Altered sleeping - - - 0 0  Tired, decreased energy - - - 0 0  Change in appetite - - - 0 0  Feeling bad or failure about yourself  - - - 0 0  Trouble concentrating - - - 0 0  Moving slowly or fidgety/restless - - - 0 0  Suicidal thoughts - - - 0 0  PHQ-9 Score - - - 0 0  Difficult doing work/chores - - - - -  Some recent data might be hidden   Hypertension: BP Readings from Last 3 Encounters:  06/21/21 120/64  01/21/21 118/70  07/21/20 124/84   Obesity: Wt Readings from Last 3 Encounters:  06/21/21 141 lb (64 kg)  01/21/21 139 lb 9.6 oz (63.3 kg)  07/21/20 138 lb (62.6 kg)   BMI Readings from Last 3 Encounters:  06/21/21 25.79 kg/m  01/21/21 25.53 kg/m  07/21/20 25.24 kg/m     Vaccines:   Shingrix: up to date  Pneumonia: educated and discussed with patient. Flu: educated and discussed with patient.  Hep C Screening: 11/17/15 STD testing and prevention (HIV/chl/gon/syphilis): not interested  Intimate partner violence:negative Sexual History : same partner , no pain or discomfort  Menstrual History/LMP/Abnormal Bleeding: discussed post-menopausal bleeding  Incontinence Symptoms: mild symptoms, usually when bladder is very full   Breast cancer:  - Last Mammogram: 07/30/2020 - BRCA gene screening: N/A  Osteoporosis: Discussed high calcium and vitamin D supplementation, weight bearing  exercises  Cervical cancer screening: N/A  Skin cancer: Discussed monitoring for atypical lesions  Colorectal cancer: 09/01/2019   Lung cancer:  Low Dose CT Chest recommended if Age 52-80 years, 20 pack-year currently smoking OR have quit w/in 15years. Patient does not qualify.   ECG: 11/21/17  Advanced Care Planning: A voluntary discussion about advance care planning including the explanation and discussion of advance directives.  Discussed health care proxy and Living will, and the patient was able to identify a health care proxy as husband .   Lipids: Lab Results  Component Value Date   CHOL 234 (H) 07/21/2020   CHOL 225 (H) 08/18/2019   CHOL 251 (H) 10/03/2018   Lab Results  Component Value Date   HDL 55 07/21/2020   HDL 64 08/18/2019   HDL 59 10/03/2018   Lab Results  Component Value Date   LDLCALC 156 (H) 07/21/2020   LDLCALC 137 (H) 08/18/2019   LDLCALC 167 (H) 10/03/2018   Lab Results  Component Value Date   TRIG 117 07/21/2020   TRIG 119 08/18/2019   TRIG 120 10/03/2018   Lab Results  Component Value Date   CHOLHDL 4.3 07/21/2020   CHOLHDL 3.5 08/18/2019   CHOLHDL 4.3 10/03/2018   No results found for: LDLDIRECT  Glucose: Glucose, Bld  Date Value Ref Range Status  07/21/2020 86 65 -  99 mg/dL Final    Comment:    .            Fasting reference interval .   08/18/2019 86 65 - 99 mg/dL Final    Comment:    .            Fasting reference interval .   08/16/2018 89 65 - 99 mg/dL Final    Comment:    .            Fasting reference interval .     Patient Active Problem List   Diagnosis Date Noted   Strain of right hip 07/07/2019   Gastritis 11/26/2018   Hiatal hernia 11/26/2018   Flushing 09/04/2018   Palpitations 03/19/2017   History of basal cell carcinoma 11/20/2016   Primary osteoarthritis of left knee 03/09/2016   History of total knee replacement, left 03/09/2016   Osteopenia 11/17/2015   Migraine without aura and without status  migrainosus, not intractable 11/17/2015   Urge urinary incontinence 11/17/2015   Dyslipidemia 11/17/2015   Large breasts 11/17/2015   Menopause 11/17/2015   Vitamin D deficiency 11/17/2015   MVP (mitral valve prolapse) 11/17/2015   History of lumbar fusion 11/17/2015   Chronic constipation 11/17/2015   Venous insufficiency 11/17/2015   GERD (gastroesophageal reflux disease) 11/17/2015   Eustachian tube dysfunction 11/17/2015   Sciatica of left side     Past Surgical History:  Procedure Laterality Date   ABDOMINAL HYSTERECTOMY  1985   APPENDECTOMY     BACK SURGERY     BASAL CELL CARCINOMA EXCISION Left 10/2016   Underneath Left Eye- [place] [facility]- Dr. Nehemiah Massed   COLONOSCOPY N/A 11/14/2017   Procedure: COLONOSCOPY;  Surgeon: Toledo, Benay Pike, MD;  Location: [facility] ENDOSCOPY;  Service: Endoscopy;  Laterality: N/A;   COLONOSCOPY WITH PROPOFOL N/A 09/01/2019   Procedure: COLONOSCOPY WITH PROPOFOL;  Surgeon: Lollie Sails, MD;  Location: [facility] ENDOSCOPY;  Service: Endoscopy;  Laterality: N/A;   ESOPHAGOGASTRODUODENOSCOPY (EGD) WITH PROPOFOL N/A 11/20/2018   Procedure: ESOPHAGOGASTRODUODENOSCOPY (EGD) WITH PROPOFOL;  Surgeon: Toledo, Benay Pike, MD;  Location: [facility] ENDOSCOPY;  Service: Gastroenterology;  Laterality: N/A;   JOINT REPLACEMENT Left    (LT) TKR 2017   lumbar descectomy     sling procedure     TOTAL KNEE ARTHROPLASTY Left 03/09/2016   Procedure: TOTAL KNEE ARTHROPLASTY;  Surgeon: Hessie Knows, MD;  Location: [facility] ORS;  Service: Orthopedics;  Laterality: Left;   TUBAL LIGATION     tummy tuck      Family History  Problem Relation Age of Onset   Breast cancer Sister 72       twice, both breasts   Graves' disease Sister    Goiter Sister    Dementia Mother    Cancer Father        liver   Thyroid disease Brother    Prostate cancer Brother    Breast cancer Maternal Grandmother        ? age   Dysfunctional uterine bleeding Daughter    Dementia Maternal Grandfather     Cancer Paternal Grandmother     Social History   Socioeconomic History   Marital status: Married    Spouse name: Coralyn Mark    Number of children: 2   Years of education: Not on file   Highest education level: 12th grade  Occupational History   Occupation: Estate agent. 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    Social Determinants of Health   Financial Resource Strain: Low Risk    Difficulty of Paying Living Expenses: Not hard at all  Food Insecurity: No Food Insecurity   Worried About Charity fundraiser in the Last Year: Never true   [place] in the Last Year: Never true  Transportation Needs: No Transportation Needs   Lack of Transportation (Medical): No   Lack of Transportation (Non-Medical): No  Physical Activity: Insufficiently Active   Days of Exercise per Week: 4 days   Minutes of Exercise per Session: 30 min  Stress: Stress Concern Present   Feeling of Stress : To some extent  Social Connections: Engineer, building services of Communication with Friends and Family: More than three times a week   Frequency of Social Gatherings with Friends and Family: More than three times a week   Attends Religious Services: More than 4 times per year   Active Member of [employer] or Organizations: Yes   Attends Music therapist: More than 4 times per year   Marital Status: Married  Human resources officer Violence: Not At Risk   Fear of Current or Ex-Partner: No   Emotionally Abused: No    Physically Abused: No   Sexually Abused: No     Current Outpatient Medications:    Ascorbic Acid (VITAMIN C PO), Take 1,000 mg by mouth daily., Disp: , Rfl:    aspirin 81 MG tablet, Take 81 mg by mouth daily., Disp: , Rfl:    Cholecalciferol (VITAMIN D-3 PO), Take 5,000 Units by mouth daily., Disp: , Rfl:    diltiazem (CARDIZEM CD) 120 MG 24 hr capsule, Take 1 capsule (120 mg total) by mouth daily., Disp: 30 capsule, Rfl: 12   fluticasone (FLONASE) 50 MCG/ACT nasal spray, Place 2 sprays into both nostrils daily., Disp: 16 g, Rfl: 0   hydrOXYzine (ATARAX/VISTARIL) 10 MG tablet, Take 1-2 tablets (10-20 mg total) by mouth at bedtime., Disp: 90 tablet, Rfl: 0   ibandronate (BONIVA) 150 MG tablet, TAKE ONE TABLET BY MOUTH EVERY 30 DAYS, Disp: 1 tablet, Rfl: 12   latanoprost (XALATAN) 0.005 % ophthalmic solution, , Disp: , Rfl:    Misc Natural Products (OSTEO BI-FLEX JOINT SHIELD PO), Take 1 tablet by mouth daily., Disp: , Rfl:    Omega-3 Krill Oil 300 MG CAPS, Take 1 tablet by mouth daily., Disp: , Rfl:    Polyethylene Glycol 3350 (PEG 3350) 17 GM/SCOOP POWD, Take 17 g by mouth daily., Disp: 527 g, Rfl: 0   SUMAtriptan (IMITREX) 100 MG tablet, Take 1 tablet (100 mg total) by mouth as needed., Disp: 9 tablet, Rfl: 5   vitamin B-12 (CYANOCOBALAMIN) 1000 MCG tablet, Take by mouth., Disp: , Rfl:   No Known Allergies   ROS  Constitutional: Negative for fever or weight change.  Respiratory: Negative for cough and shortness of breath.   Cardiovascular: Negative for chest pain or palpitations.  Gastrointestinal: Negative  for abdominal pain, no bowel changes.  Musculoskeletal: Negative for gait problem or joint swelling.  Skin: Negative for rash.  Neurological: Negative for dizziness or headache.  No other specific complaints in a complete review of systems (except as listed in HPI above).   Objective  Vitals:   06/21/21 0917  BP: 120/64  Pulse: 92  Resp: 16  Temp: 98 F (36.7 C)   TempSrc: Oral  SpO2: 95%  Weight: 141 lb (64 kg)  Height: 5' 2" (1.575 m)    Body mass index is 25.79 kg/m.  Physical Exam  Constitutional: Patient appears well-developed and well-nourished. No distress.  HENT: Head: Normocephalic and atraumatic. Ears: B TMs ok, no erythema or effusion; Nose: Nose normal. Mouth/Throat: Oropharynx is clear and moist. No oropharyngeal exudate.  Eyes: Conjunctivae and EOM are normal. Pupils are equal, round, and reactive to light. No scleral icterus.  Neck: Normal range of motion. Neck supple. No JVD present. No thyromegaly present.  Cardiovascular: Normal rate, regular rhythm and normal heart sounds.  No murmur heard. No BLE edema. Pulmonary/Chest: Effort normal and breath sounds normal. No respiratory distress. Abdominal: Soft. Bowel sounds are normal, no distension. There is no tenderness. no masses Breast: no lumps or masses, no nipple discharge or rashes FEMALE GENITALIA:  Not done  RECTAL: not done  Musculoskeletal: Normal range of motion, no joint effusions. No gross deformities Neurological: he is alert and oriented to person, place, and time. No cranial nerve deficit. Coordination, balance, strength, speech and gait are normal.  Skin: Skin is warm and dry. No rash noted. No erythema.  Psychiatric: Patient has a normal mood and affect. behavior is normal. Judgment and thought content normal.   Fall Risk: Fall Risk  06/21/2021 01/25/2021 01/21/2021 07/21/2020 12/18/2019  Falls in the past year? 0 0 0 0 0  Number falls in past yr: 0 0 0 - 0  Injury with Fall? 0 0 0 - 0  Risk for fall due to : - No Fall Risks - - -  Follow up - Falls prevention discussed - - -     Functional Status Survey: Is the patient deaf or have difficulty hearing?: Yes Does the patient have difficulty seeing, even when wearing glasses/contacts?: Yes Does the patient have difficulty concentrating, remembering, or making decisions?: No Does the patient have difficulty  walking or climbing stairs?: No Does the patient have difficulty dressing or bathing?: No Does the patient have difficulty doing errands alone such as visiting a doctor's office or shopping?: No   Assessment & Plan  1. Well adult exam  - Lipid panel - CBC with Differential/Platelet - COMPLETE METABOLIC PANEL WITH GFR - MM 3D SCREEN BREAST BILATERAL; Future  2. Vitamin D deficiency   3. Long-term use of high-risk medication  - CBC with Differential/Platelet - COMPLETE METABOLIC PANEL WITH GFR  4. Osteopenia, unspecified location   5. Dyslipidemia  - Lipid panel  6. Breast cancer screening by mammogram  - MM 3D SCREEN BREAST BILATERAL; Future   -USPSTF grade A and B recommendations reviewed with patient; age-appropriate recommendations, preventive care, screening tests, etc discussed and encouraged; healthy living encouraged; see AVS for patient education given to patient -Discussed importance of 150 minutes of physical activity weekly, eat two servings of fish weekly, eat one serving of tree nuts ( cashews, pistachios, pecans, almonds.Marland Kitchen) every other day, eat 6 servings of fruit/vegetables daily and drink plenty of water and avoid sweet beverages.

## 2021-06-20 NOTE — Patient Instructions (Addendum)
Diclofenac gel or voltaren gel 1%   Preventive Care 71 Years and Older, Female Preventive care refers to lifestyle choices and visits with your health care provider that can promote health and wellness. This includes: A yearly physical exam. This is also called an annual wellness visit. Regular dental and eye exams. Immunizations. Screening for certain conditions. Healthy lifestyle choices, such as: Eating a healthy diet. Getting regular exercise. Not using drugs or products that contain nicotine and tobacco. Limiting alcohol use. What can I expect for my preventive care visit? Physical exam Your health care provider will check your: Height and weight. These may be used to calculate your BMI (body mass index). BMI is a measurement that tells if you are at a healthy weight. Heart rate and blood pressure. Body temperature. Skin for abnormal spots. Counseling Your health care provider may ask you questions about your: Past medical problems. Family's medical history. Alcohol, tobacco, and drug use. Emotional well-being. Home life and relationship well-being. Sexual activity. Diet, exercise, and sleep habits. History of falls. Memory and ability to understand (cognition). Work and work Statistician. Pregnancy and menstrual history. Access to firearms. What immunizations do I need?  Vaccines are usually given at various ages, according to a schedule. Your health care provider will recommend vaccines for you based on your age, medicalhistory, and lifestyle or other factors, such as travel or where you work. What tests do I need? Blood tests Lipid and cholesterol levels. These may be checked every 5 years, or more often depending on your overall health. Hepatitis C test. Hepatitis B test. Screening Lung cancer screening. You may have this screening every year starting at age 28 if you have a 30-pack-year history of smoking and currently smoke or have quit within the past 15  years. Colorectal cancer screening. All adults should have this screening starting at age 31 and continuing until age 27. Your health care provider may recommend screening at age 39 if you are at increased risk. You will have tests every 1-10 years, depending on your results and the type of screening test. Diabetes screening. This is done by checking your blood sugar (glucose) after you have not eaten for a while (fasting). You may have this done every 1-3 years. Mammogram. This may be done every 1-2 years. Talk with your health care provider about how often you should have regular mammograms. Abdominal aortic aneurysm (AAA) screening. You may need this if you are a current or former smoker. BRCA-related cancer screening. This may be done if you have a family history of breast, ovarian, tubal, or peritoneal cancers. Other tests STD (sexually transmitted disease) testing, if you are at risk. Bone density scan. This is done to screen for osteoporosis. You may have this done starting at age 46. Talk with your health care provider about your test results, treatment options,and if necessary, the need for more tests. Follow these instructions at home: Eating and drinking  Eat a diet that includes fresh fruits and vegetables, whole grains, lean protein, and low-fat dairy products. Limit your intake of foods with high amounts of sugar, saturated fats, and salt. Take vitamin and mineral supplements as recommended by your health care provider. Do not drink alcohol if your health care provider tells you not to drink. If you drink alcohol: Limit how much you have to 0-1 drink a day. Be aware of how much alcohol is in your drink. In the [place] one drink equals one 12 oz bottle of beer (355 mL), one 5 oz  glass of wine (148 mL), or one 1 oz glass of hard liquor (44 mL).  Lifestyle Take daily care of your teeth and gums. Brush your teeth every morning and night with fluoride toothpaste. Floss one time  each day. Stay active. Exercise for at least 30 minutes 5 or more days each week. Do not use any products that contain nicotine or tobacco, such as cigarettes, e-cigarettes, and chewing tobacco. If you need help quitting, ask your health care provider. Do not use drugs. If you are sexually active, practice safe sex. Use a condom or other form of protection in order to prevent STIs (sexually transmitted infections). Talk with your health care provider about taking a low-dose aspirin or statin. Find healthy ways to cope with stress, such as: Meditation, yoga, or listening to music. Journaling. Talking to a trusted person. Spending time with friends and family. Safety Always wear your seat belt while driving or riding in a vehicle. Do not drive: If you have been drinking alcohol. Do not ride with someone who has been drinking. When you are tired or distracted. While texting. Wear a helmet and other protective equipment during sports activities. If you have firearms in your house, make sure you follow all gun safety procedures. What's next? Visit your health care provider once a year for an annual wellness visit. Ask your health care provider how often you should have your eyes and teeth checked. Stay up to date on all vaccines. This information is not intended to replace advice given to you by your health care provider. Make sure you discuss any questions you have with your healthcare provider. Document Revised: 11/10/2020 Document Reviewed: 11/14/2018 Elsevier Patient Education  2022 [employer].

## 2021-06-21 ENCOUNTER — Ambulatory Visit (INDEPENDENT_AMBULATORY_CARE_PROVIDER_SITE_OTHER): Payer: Medicare HMO | Admitting: Family Medicine

## 2021-06-21 ENCOUNTER — Encounter: Payer: Self-pay | Admitting: Family Medicine

## 2021-06-21 ENCOUNTER — Other Ambulatory Visit: Payer: Self-pay

## 2021-06-21 VITALS — BP 120/64 | HR 92 | Temp 98.0°F | Resp 16 | Ht 62.0 in | Wt 141.0 lb

## 2021-06-21 DIAGNOSIS — M8580 Other specified disorders of bone density and structure, unspecified site: Secondary | ICD-10-CM | POA: Diagnosis not present

## 2021-06-21 DIAGNOSIS — E559 Vitamin D deficiency, unspecified: Secondary | ICD-10-CM | POA: Diagnosis not present

## 2021-06-21 DIAGNOSIS — Z0000 Encounter for general adult medical examination without abnormal findings: Secondary | ICD-10-CM | POA: Diagnosis not present

## 2021-06-21 DIAGNOSIS — Z79899 Other long term (current) drug therapy: Secondary | ICD-10-CM

## 2021-06-21 DIAGNOSIS — Z1231 Encounter for screening mammogram for malignant neoplasm of breast: Secondary | ICD-10-CM

## 2021-06-21 DIAGNOSIS — E785 Hyperlipidemia, unspecified: Secondary | ICD-10-CM

## 2021-06-21 LAB — CBC WITH DIFFERENTIAL/PLATELET
Absolute Monocytes: 494 cells/uL (ref 200–950)
Basophils Absolute: 39 cells/uL (ref 0–200)
Basophils Relative: 0.6 %
Eosinophils Absolute: 39 cells/uL (ref 15–500)
Eosinophils Relative: 0.6 %
HCT: 42.2 % (ref 35.0–45.0)
Hemoglobin: 14.2 g/dL (ref 11.7–15.5)
Lymphs Abs: 2275 cells/uL (ref 850–3900)
MCH: 32 pg (ref 27.0–33.0)
MCHC: 33.6 g/dL (ref 32.0–36.0)
MCV: 95 fL (ref 80.0–100.0)
MPV: 8.6 fL (ref 7.5–12.5)
Monocytes Relative: 7.6 %
Neutro Abs: 3653 cells/uL (ref 1500–7800)
Neutrophils Relative %: 56.2 %
Platelets: 306 10*3/uL (ref 140–400)
RBC: 4.44 10*6/uL (ref 3.80–5.10)
RDW: 11.9 % (ref 11.0–15.0)
Total Lymphocyte: 35 %
WBC: 6.5 10*3/uL (ref 3.8–10.8)

## 2021-06-21 LAB — COMPLETE METABOLIC PANEL WITH GFR
AG Ratio: 1.7 (calc) (ref 1.0–2.5)
ALT: 12 U/L (ref 6–29)
AST: 19 U/L (ref 10–35)
Albumin: 4.3 g/dL (ref 3.6–5.1)
Alkaline phosphatase (APISO): 54 U/L (ref 37–153)
BUN: 14 mg/dL (ref 7–25)
CO2: 29 mmol/L (ref 20–32)
Calcium: 9 mg/dL (ref 8.6–10.4)
Chloride: 103 mmol/L (ref 98–110)
Creat: 0.64 mg/dL (ref 0.60–1.00)
Globulin: 2.6 g/dL (calc) (ref 1.9–3.7)
Glucose, Bld: 86 mg/dL (ref 65–99)
Potassium: 4.6 mmol/L (ref 3.5–5.3)
Sodium: 139 mmol/L (ref 135–146)
Total Bilirubin: 0.6 mg/dL (ref 0.2–1.2)
Total Protein: 6.9 g/dL (ref 6.1–8.1)
eGFR: 94 mL/min/{1.73_m2} (ref >=60)

## 2021-06-21 LAB — LIPID PANEL
Cholesterol: 220 mg/dL — ABNORMAL HIGH (ref <200)
HDL: 62 mg/dL (ref >=50)
LDL Cholesterol (Calc): 137 mg/dL (calc) — ABNORMAL HIGH
Non-HDL Cholesterol (Calc): 158 mg/dL (calc) — ABNORMAL HIGH (ref <130)
Total CHOL/HDL Ratio: 3.5 (calc) (ref <5.0)
Triglycerides: 100 mg/dL (ref <150)

## 2021-07-25 ENCOUNTER — Other Ambulatory Visit: Payer: Self-pay | Admitting: Family Medicine

## 2021-07-25 DIAGNOSIS — M8580 Other specified disorders of bone density and structure, unspecified site: Secondary | ICD-10-CM

## 2021-07-28 ENCOUNTER — Ambulatory Visit: Payer: Medicare HMO | Admitting: Family Medicine

## 2021-08-01 ENCOUNTER — Other Ambulatory Visit: Payer: Self-pay | Admitting: Family Medicine

## 2021-08-01 ENCOUNTER — Other Ambulatory Visit: Payer: Self-pay

## 2021-08-01 ENCOUNTER — Ambulatory Visit
Admission: RE | Admit: 2021-08-01 | Discharge: 2021-08-01 | Disposition: A | Discharge status: Home / Self Care | Payer: Medicare HMO | Source: Ambulatory Visit | Attending: Family Medicine | Admitting: Family Medicine

## 2021-08-01 DIAGNOSIS — Z1231 Encounter for screening mammogram for malignant neoplasm of breast: Secondary | ICD-10-CM | POA: Insufficient documentation

## 2021-08-01 DIAGNOSIS — Z0000 Encounter for general adult medical examination without abnormal findings: Secondary | ICD-10-CM

## 2021-08-01 DIAGNOSIS — G43009 Migraine without aura, not intractable, without status migrainosus: Secondary | ICD-10-CM

## 2021-08-30 ENCOUNTER — Other Ambulatory Visit: Payer: Self-pay | Admitting: Family Medicine

## 2021-08-30 DIAGNOSIS — G43009 Migraine without aura, not intractable, without status migrainosus: Secondary | ICD-10-CM

## 2021-09-30 ENCOUNTER — Other Ambulatory Visit: Payer: Self-pay | Admitting: Family Medicine

## 2021-09-30 DIAGNOSIS — K5909 Other constipation: Secondary | ICD-10-CM

## 2021-10-25 ENCOUNTER — Other Ambulatory Visit: Payer: Self-pay | Admitting: Family Medicine

## 2021-10-25 DIAGNOSIS — G43009 Migraine without aura, not intractable, without status migrainosus: Secondary | ICD-10-CM

## 2021-11-21 ENCOUNTER — Other Ambulatory Visit: Payer: Self-pay | Admitting: Family Medicine

## 2021-11-21 DIAGNOSIS — G43009 Migraine without aura, not intractable, without status migrainosus: Secondary | ICD-10-CM

## 2021-12-16 ENCOUNTER — Other Ambulatory Visit: Payer: Self-pay | Admitting: Orthopedic Surgery

## 2021-12-19 ENCOUNTER — Other Ambulatory Visit: Payer: Self-pay | Admitting: Family Medicine

## 2021-12-19 DIAGNOSIS — G43009 Migraine without aura, not intractable, without status migrainosus: Secondary | ICD-10-CM

## 2021-12-27 ENCOUNTER — Other Ambulatory Visit: Payer: Self-pay

## 2021-12-27 ENCOUNTER — Encounter
Admission: RE | Admit: 2021-12-27 | Discharge: 2021-12-27 | Disposition: A | Discharge status: Home / Self Care | Payer: Medicare HMO | Source: Ambulatory Visit | Attending: Orthopedic Surgery | Admitting: Orthopedic Surgery

## 2021-12-27 VITALS — BP 120/83 | HR 83 | Temp 97.8°F | Resp 12 | Ht 62.0 in | Wt 143.0 lb

## 2021-12-27 DIAGNOSIS — Z01812 Encounter for preprocedural laboratory examination: Secondary | ICD-10-CM

## 2021-12-27 DIAGNOSIS — Z01818 Encounter for other preprocedural examination: Secondary | ICD-10-CM | POA: Insufficient documentation

## 2021-12-27 LAB — CBC WITH DIFFERENTIAL/PLATELET
Abs Immature Granulocytes: 0.02 10*3/uL (ref 0.00–0.07)
Basophils Absolute: 0 10*3/uL (ref 0.0–0.1)
Basophils Relative: 1 %
Eosinophils Absolute: 0 10*3/uL (ref 0.0–0.5)
Eosinophils Relative: 1 %
HCT: 43.3 % (ref 36.0–46.0)
Hemoglobin: 14.5 g/dL (ref 12.0–15.0)
Immature Granulocytes: 0 %
Lymphocytes Relative: 40 %
Lymphs Abs: 2.1 10*3/uL (ref 0.7–4.0)
MCH: 32.2 pg (ref 26.0–34.0)
MCHC: 33.5 g/dL (ref 30.0–36.0)
MCV: 96.2 fL (ref 80.0–100.0)
Monocytes Absolute: 0.4 10*3/uL (ref 0.1–1.0)
Monocytes Relative: 8 %
Neutro Abs: 2.6 10*3/uL (ref 1.7–7.7)
Neutrophils Relative %: 50 %
Platelets: 290 10*3/uL (ref 150–400)
RBC: 4.5 MIL/uL (ref 3.87–5.11)
RDW: 12.5 % (ref 11.5–15.5)
WBC: 5.2 10*3/uL (ref 4.0–10.5)
nRBC: 0 % (ref 0.0–0.2)

## 2021-12-27 LAB — URINALYSIS, ROUTINE W REFLEX MICROSCOPIC
Bacteria, UA: NONE SEEN
Bilirubin Urine: NEGATIVE
Glucose, UA: NEGATIVE mg/dL
Hgb urine dipstick: NEGATIVE
Leukocytes,Ua: NEGATIVE
Nitrite: NEGATIVE
Protein, ur: NEGATIVE mg/dL
Specific Gravity, Urine: 1.02 (ref 1.005–1.030)
Squamous Epithelial / HPF: NONE SEEN (ref 0–5)
pH: 6 (ref 5.0–8.0)

## 2021-12-27 LAB — COMPREHENSIVE METABOLIC PANEL
ALT: 14 U/L (ref 0–44)
AST: 18 U/L (ref 15–41)
Albumin: 4.1 g/dL (ref 3.5–5.0)
Alkaline Phosphatase: 53 U/L (ref 38–126)
Anion gap: 8 (ref 5–15)
BUN: 14 mg/dL (ref 8–23)
CO2: 28 mmol/L (ref 22–32)
Calcium: 9.2 mg/dL (ref 8.9–10.3)
Chloride: 102 mmol/L (ref 98–111)
Creatinine, Ser: 0.52 mg/dL (ref 0.44–1.00)
GFR, Estimated: >60 mL/min (ref >60)
Glucose, Bld: 89 mg/dL (ref 70–99)
Potassium: 4 mmol/L (ref 3.5–5.1)
Sodium: 138 mmol/L (ref 135–145)
Total Bilirubin: 0.8 mg/dL (ref 0.3–1.2)
Total Protein: 7.3 g/dL (ref 6.5–8.1)

## 2021-12-27 LAB — SURGICAL PCR SCREEN
MRSA, PCR: POSITIVE — AB
Staphylococcus aureus: POSITIVE — AB

## 2021-12-27 LAB — TYPE AND SCREEN
ABO/RH(D): A NEG
Antibody Screen: NEGATIVE

## 2021-12-27 NOTE — Patient Instructions (Addendum)
Your procedure is scheduled on: Thursday, February 2 Report to the Registration Desk on the 1st floor of the [facility]. To find out your arrival time, please call [phone] between 1PM - 3PM on: Wednesday, February 1  REMEMBER: Instructions that are not followed completely may result in serious medical risk, up to and including death; or upon the discretion of your surgeon and anesthesiologist your surgery may need to be rescheduled.  Do not eat food after midnight the night before surgery.  No gum chewing, lozengers or hard candies.  You may however, drink CLEAR liquids up to 2 hours before you are scheduled to arrive for your surgery. Do not drink anything within 2 hours of your scheduled arrival time.  Clear liquids include: - water  - apple juice without pulp - gatorade (not RED, PURPLE, OR BLUE) - black coffee or tea (Do NOT add milk or creamers to the coffee or tea) Do NOT drink anything that is not on this list.  In addition, your doctor has ordered for you to drink the provided  Ensure Pre-Surgery Clear Carbohydrate Drink  Drinking this carbohydrate drink up to two hours before surgery helps to reduce insulin resistance and improve patient outcomes. Please complete drinking 2 hours prior to scheduled arrival time.  TAKE THESE MEDICATIONS THE MORNING OF SURGERY WITH A SIP OF WATER:  Diltiazem (Cardizem)  Follow recommendations from Cardiologist regarding stopping Aspirin.  One week prior to surgery: starting January 26 Stop Anti-inflammatories (NSAIDS) such as Advil, Aleve, Ibuprofen, Motrin, Naproxen, Naprosyn and Aspirin based products such as Excedrin, Goodys Powder, BC Powder. Stop ANY OVER THE COUNTER supplements until after surgery. Stop osteo bi-flex, vitamin D, vitamin C You may however, continue to take Tylenol if needed for pain up until the day of surgery.  No Alcohol for 24 hours before or after surgery.  No Smoking including e-cigarettes for 24 hours  prior to surgery.   On the morning of surgery brush your teeth with toothpaste and water, you may rinse your mouth with mouthwash if you wish. Do not swallow any toothpaste or mouthwash.  Use CHG Soap as directed on instruction sheet.  Do not wear jewelry, make-up, hairpins, clips or nail polish.  Do not wear lotions, powders, or perfumes.   Do not shave body from the neck down 48 hours prior to surgery just in case you cut yourself which could leave a site for infection.  Also, freshly shaved skin may become irritated if using the CHG soap.  Contact lenses, hearing aids and dentures may not be worn into surgery.  Do not bring valuables to the hospital. [facility] is not responsible for any missing/lost belongings or valuables.   Notify your doctor if there is any change in your medical condition (cold, fever, infection).  Wear comfortable clothing (specific to your surgery type) to the hospital.  After surgery, you can help prevent lung complications by doing breathing exercises.  Take deep breaths and cough every 1-2 hours. Your doctor may order a device called an Incentive Spirometer to help you take deep breaths.  If you are being admitted to the hospital overnight, leave your suitcase in the car. After surgery it may be brought to your room.  If you are being discharged the day of surgery, you will not be allowed to drive home. You will need a responsible adult (18 years or older) to drive you home and stay with you that night.   If you are taking public transportation, you  will need to have a responsible adult (18 years or older) with you. Please confirm with your physician that it is acceptable to use public transportation.   Please call the [place] Dept. at [phone] if you have any questions about these instructions.  Surgery Visitation Policy:  Patients undergoing a surgery or procedure may have one family member or support person with them as long  as that person is not COVID-19 positive or experiencing its symptoms.  That person may remain in the waiting area during the procedure and may rotate out with other people.  Inpatient Visitation:    Visiting hours are 7 a.m. to 8 p.m. Up to two visitors ages 16+ are allowed at one time in a patient room. The visitors may rotate out with other people during the day. Visitors must check out when they leave, or other visitors will not be allowed. One designated support person may remain overnight. The visitor must pass COVID-19 screenings, use hand sanitizer when entering and exiting the patients room and wear a mask at all times, including in the patients room. Patients must also wear a mask when staff or their visitor are in the room. Masking is required regardless of vaccination status.

## 2021-12-27 NOTE — Progress Notes (Signed)
°  Perioperative Services  Abnormal Lab Notification  Date: 12/27/21  Name: Bonnie Owens MRN:   [id number]  Re: Abnormal labs noted during PAT appointment  Provider Notified: Hessie Knows, MD Notification mode: Routed and/or faxed via [place] of concern: Lab Results  Component Value Date   STAPHAUREUS POSITIVE (A) 12/27/2021   MRSAPCR POSITIVE (A) 12/27/2021    Notes: Patient is scheduled for a Left knee revision, femoral component and tibial insert (Left: Knee) on 01/05/2022. She is scheduled to receive CEFAZOLIN pre-operatively. Surgical PCR (+) for MRSA; see above.  PLANS:  Review renal function. Estimated Creatinine Clearance: 57 mL/min (by C-G formula based on SCr of 0.52 mg/dL). Review allergies. No documented allergy to vancomycin. CEFAZOLIN discontinued. Order added for VANCOMYCIN 1 GRAM IV to preoperative prophylactic regimen.  Notify primary attending surgeon of (+) MRSA result and additional order placed for antibiotic by perioperative APP.   This is a Community education officer; no formal response is required.  Honor Loh, MSN, APRN, FNP-C, CEN [facility]  Peri-operative Services Nurse Practitioner Phone: [phone] 12/27/21 2:17 PM

## 2021-12-28 ENCOUNTER — Other Ambulatory Visit: Payer: Medicare HMO

## 2022-01-03 ENCOUNTER — Other Ambulatory Visit: Payer: Self-pay

## 2022-01-03 ENCOUNTER — Other Ambulatory Visit
Admission: RE | Admit: 2022-01-03 | Discharge: 2022-01-03 | Disposition: A | Discharge status: Home / Self Care | Payer: Medicare HMO | Source: Ambulatory Visit | Attending: Orthopedic Surgery | Admitting: Orthopedic Surgery

## 2022-01-03 DIAGNOSIS — Z20822 Contact with and (suspected) exposure to covid-19: Secondary | ICD-10-CM | POA: Insufficient documentation

## 2022-01-03 DIAGNOSIS — Z01812 Encounter for preprocedural laboratory examination: Secondary | ICD-10-CM | POA: Insufficient documentation

## 2022-01-03 LAB — SARS CORONAVIRUS 2 (TAT 6-24 HRS): SARS Coronavirus 2: NEGATIVE

## 2022-01-05 ENCOUNTER — Other Ambulatory Visit: Payer: Self-pay

## 2022-01-05 ENCOUNTER — Inpatient Hospital Stay
Admission: RE | Admit: 2022-01-05 | Discharge: 2022-01-07 | DRG: 468 | Disposition: A | Discharge status: Home Health | Payer: Medicare HMO | Attending: Orthopedic Surgery | Admitting: Orthopedic Surgery

## 2022-01-05 ENCOUNTER — Inpatient Hospital Stay: Payer: Medicare HMO | Admitting: Urgent Care

## 2022-01-05 ENCOUNTER — Inpatient Hospital Stay: Payer: Medicare HMO

## 2022-01-05 ENCOUNTER — Encounter: Payer: Self-pay | Admitting: Orthopedic Surgery

## 2022-01-05 ENCOUNTER — Encounter
Admission: RE | Disposition: A | Discharge status: Home / Self Care | Payer: Self-pay | Source: Home / Self Care | Attending: Orthopedic Surgery

## 2022-01-05 DIAGNOSIS — Y792 Prosthetic and other implants, materials and accessory orthopedic devices associated with adverse incidents: Secondary | ICD-10-CM | POA: Diagnosis present

## 2022-01-05 DIAGNOSIS — Z85828 Personal history of other malignant neoplasm of skin: Secondary | ICD-10-CM | POA: Diagnosis not present

## 2022-01-05 DIAGNOSIS — I341 Nonrheumatic mitral (valve) prolapse: Secondary | ICD-10-CM | POA: Diagnosis present

## 2022-01-05 DIAGNOSIS — G8918 Other acute postprocedural pain: Secondary | ICD-10-CM

## 2022-01-05 DIAGNOSIS — Z79899 Other long term (current) drug therapy: Secondary | ICD-10-CM

## 2022-01-05 DIAGNOSIS — T84023A Instability of internal left knee prosthesis, initial encounter: Secondary | ICD-10-CM | POA: Diagnosis present

## 2022-01-05 DIAGNOSIS — E785 Hyperlipidemia, unspecified: Secondary | ICD-10-CM | POA: Diagnosis present

## 2022-01-05 DIAGNOSIS — Z20822 Contact with and (suspected) exposure to covid-19: Secondary | ICD-10-CM | POA: Diagnosis present

## 2022-01-05 DIAGNOSIS — K219 Gastro-esophageal reflux disease without esophagitis: Secondary | ICD-10-CM | POA: Diagnosis present

## 2022-01-05 DIAGNOSIS — Z96652 Presence of left artificial knee joint: Secondary | ICD-10-CM

## 2022-01-05 DIAGNOSIS — Z7982 Long term (current) use of aspirin: Secondary | ICD-10-CM

## 2022-01-05 DIAGNOSIS — Z981 Arthrodesis status: Secondary | ICD-10-CM

## 2022-01-05 DIAGNOSIS — H409 Unspecified glaucoma: Secondary | ICD-10-CM | POA: Diagnosis present

## 2022-01-05 DIAGNOSIS — R110 Nausea: Secondary | ICD-10-CM | POA: Diagnosis not present

## 2022-01-05 HISTORY — PX: TOTAL KNEE REVISION: SHX996

## 2022-01-05 LAB — CBC
HCT: 37.2 % (ref 36.0–46.0)
Hemoglobin: 12.3 g/dL (ref 12.0–15.0)
MCH: 32.4 pg (ref 26.0–34.0)
MCHC: 33.1 g/dL (ref 30.0–36.0)
MCV: 97.9 fL (ref 80.0–100.0)
Platelets: 253 10*3/uL (ref 150–400)
RBC: 3.8 MIL/uL — ABNORMAL LOW (ref 3.87–5.11)
RDW: 12.2 % (ref 11.5–15.5)
WBC: 12 10*3/uL — ABNORMAL HIGH (ref 4.0–10.5)
nRBC: 0 % (ref 0.0–0.2)

## 2022-01-05 LAB — CREATININE, SERUM
Creatinine, Ser: 0.54 mg/dL (ref 0.44–1.00)
GFR, Estimated: >60 mL/min (ref >60)

## 2022-01-05 SURGERY — TOTAL KNEE REVISION
Anesthesia: Spinal | Site: Knee | Laterality: Left

## 2022-01-05 MED ORDER — KETAMINE HCL 50 MG/5ML IJ SOSY
PREFILLED_SYRINGE | INTRAMUSCULAR | Status: AC
  Filled 2022-01-05: qty 5

## 2022-01-05 MED ORDER — ACETAMINOPHEN 10 MG/ML IV SOLN
INTRAVENOUS | Status: AC
  Filled 2022-01-05: qty 100

## 2022-01-05 MED ORDER — ORAL CARE MOUTH RINSE: 15.0000 mL | Freq: Once | OROMUCOSAL | Status: AC

## 2022-01-05 MED ORDER — METHOCARBAMOL 1000 MG/10ML IJ SOLN
500.0000 mg | Freq: Four times a day (QID) | INTRAVENOUS | Status: DC | PRN
  Filled 2022-01-05: qty 5

## 2022-01-05 MED ORDER — FLEET ENEMA 7-19 GM/118ML RE ENEM: 1.0000 | ENEMA | Freq: Once | RECTAL | Status: DC | PRN

## 2022-01-05 MED ORDER — MENTHOL 3 MG MT LOZG
1.0000 | LOZENGE | OROMUCOSAL | Status: DC | PRN
  Filled 2022-01-05: qty 9

## 2022-01-05 MED ORDER — MORPHINE SULFATE (PF) 4 MG/ML IV SOLN
INTRAVENOUS | Status: AC
  Filled 2022-01-05: qty 1

## 2022-01-05 MED ORDER — CEFAZOLIN SODIUM-DEXTROSE 2-4 GM/100ML-% IV SOLN: 2.0000 g | Freq: Four times a day (QID) | INTRAVENOUS | Status: AC

## 2022-01-05 MED ORDER — DILTIAZEM HCL ER COATED BEADS 120 MG PO CP24
120.0000 mg | ORAL_CAPSULE | Freq: Every day | ORAL | Status: DC
  Administered 2022-01-06 – 2022-01-07 (×2): 120 mg via ORAL
  Filled 2022-01-05 (×2): qty 1

## 2022-01-05 MED ORDER — FAMOTIDINE 20 MG PO TABS
ORAL_TABLET | ORAL | Status: AC
  Administered 2022-01-05: 20 mg via ORAL
  Filled 2022-01-05: qty 1

## 2022-01-05 MED ORDER — MIDAZOLAM HCL 2 MG/2ML IJ SOLN
INTRAMUSCULAR | Status: AC
  Filled 2022-01-05: qty 2

## 2022-01-05 MED ORDER — ONDANSETRON HCL 4 MG/2ML IJ SOLN: 4.0000 mg | Freq: Four times a day (QID) | INTRAMUSCULAR | Status: DC | PRN

## 2022-01-05 MED ORDER — PROPOFOL 10 MG/ML IV BOLUS
INTRAVENOUS | Status: AC
  Filled 2022-01-05: qty 40

## 2022-01-05 MED ORDER — TRAMADOL HCL 50 MG PO TABS
ORAL_TABLET | ORAL | Status: AC
  Filled 2022-01-05: qty 1

## 2022-01-05 MED ORDER — SODIUM CHLORIDE 0.9 % IR SOLN
Status: DC | PRN
  Administered 2022-01-05: 3012 mL

## 2022-01-05 MED ORDER — EPHEDRINE 5 MG/ML INJ
INTRAVENOUS | Status: AC
  Filled 2022-01-05: qty 5

## 2022-01-05 MED ORDER — HYDROCODONE-ACETAMINOPHEN 5-325 MG PO TABS
ORAL_TABLET | ORAL | Status: AC
  Filled 2022-01-05: qty 1

## 2022-01-05 MED ORDER — VANCOMYCIN HCL 1000 MG IV SOLR
INTRAVENOUS | Status: AC
  Filled 2022-01-05: qty 20

## 2022-01-05 MED ORDER — VITAMIN B-12 1000 MCG PO TABS
1000.0000 ug | ORAL_TABLET | Freq: Every day | ORAL | Status: DC
  Administered 2022-01-06 – 2022-01-07 (×2): 1000 ug via ORAL
  Filled 2022-01-05 (×2): qty 1

## 2022-01-05 MED ORDER — POLYETHYLENE GLYCOL 3350 17 G PO PACK
17.0000 g | PACK | Freq: Every day | ORAL | Status: DC | PRN
  Filled 2022-01-05: qty 1

## 2022-01-05 MED ORDER — CHLORHEXIDINE GLUCONATE 0.12 % MT SOLN
OROMUCOSAL | Status: AC
  Administered 2022-01-05: 15 mL via OROMUCOSAL
  Filled 2022-01-05: qty 15

## 2022-01-05 MED ORDER — FENTANYL CITRATE (PF) 100 MCG/2ML IJ SOLN
INTRAMUSCULAR | Status: AC
  Filled 2022-01-05: qty 2

## 2022-01-05 MED ORDER — LIDOCAINE HCL (CARDIAC) PF 100 MG/5ML IV SOSY
PREFILLED_SYRINGE | INTRAVENOUS | Status: DC | PRN
  Administered 2022-01-05: 2 mL via INTRAVENOUS

## 2022-01-05 MED ORDER — ZOLPIDEM TARTRATE 5 MG PO TABS: 5.0000 mg | ORAL_TABLET | Freq: Every evening | ORAL | Status: DC | PRN

## 2022-01-05 MED ORDER — FAMOTIDINE 20 MG PO TABS
20.0000 mg | ORAL_TABLET | Freq: Once | ORAL | Status: AC
Start: 2022-01-05 — End: 2022-01-05

## 2022-01-05 MED ORDER — NEOMYCIN-POLYMYXIN B GU 40-200000 IR SOLN
Status: AC
  Filled 2022-01-05: qty 20

## 2022-01-05 MED ORDER — CEFAZOLIN SODIUM-DEXTROSE 2-4 GM/100ML-% IV SOLN
INTRAVENOUS | Status: AC
  Administered 2022-01-05: 2 g via INTRAVENOUS
  Filled 2022-01-05: qty 100

## 2022-01-05 MED ORDER — CHLORHEXIDINE GLUCONATE 0.12 % MT SOLN: 15.0000 mL | Freq: Once | OROMUCOSAL | Status: AC

## 2022-01-05 MED ORDER — METHOCARBAMOL 500 MG PO TABS
ORAL_TABLET | ORAL | Status: AC
  Filled 2022-01-05: qty 1

## 2022-01-05 MED ORDER — SUMATRIPTAN SUCCINATE 50 MG PO TABS
100.0000 mg | ORAL_TABLET | ORAL | Status: DC | PRN
  Filled 2022-01-05: qty 2

## 2022-01-05 MED ORDER — PROPOFOL 10 MG/ML IV BOLUS
INTRAVENOUS | Status: DC | PRN
  Administered 2022-01-05: 40 mg via INTRAVENOUS

## 2022-01-05 MED ORDER — SURGIRINSE WOUND IRRIGATION SYSTEM - OPTIME
TOPICAL | Status: DC | PRN
  Administered 2022-01-05: 900 mL

## 2022-01-05 MED ORDER — PHENYLEPHRINE HCL-NACL 20-0.9 MG/250ML-% IV SOLN
INTRAVENOUS | Status: AC
  Filled 2022-01-05: qty 250

## 2022-01-05 MED ORDER — FLUTICASONE PROPIONATE 50 MCG/ACT NA SUSP
2.0000 | Freq: Every day | NASAL | Status: DC | PRN
  Filled 2022-01-05: qty 16

## 2022-01-05 MED ORDER — SODIUM CHLORIDE (PF) 0.9 % IJ SOLN
INTRAMUSCULAR | Status: DC | PRN
  Administered 2022-01-05: 91 mL

## 2022-01-05 MED ORDER — BUPIVACAINE HCL (PF) 0.5 % IJ SOLN
INTRAMUSCULAR | Status: DC | PRN
  Administered 2022-01-05: 2.5 mL

## 2022-01-05 MED ORDER — BUPIVACAINE HCL (PF) 0.5 % IJ SOLN
INTRAMUSCULAR | Status: AC
  Filled 2022-01-05: qty 10

## 2022-01-05 MED ORDER — METHOCARBAMOL 500 MG PO TABS
500.0000 mg | ORAL_TABLET | Freq: Four times a day (QID) | ORAL | Status: DC | PRN
  Administered 2022-01-05: 500 mg via ORAL

## 2022-01-05 MED ORDER — MIDAZOLAM HCL 5 MG/5ML IJ SOLN
INTRAMUSCULAR | Status: DC | PRN
Start: 2022-01-05 — End: 2022-01-05
  Administered 2022-01-05: 1.5 mg via INTRAVENOUS
  Administered 2022-01-05: .5 mg via INTRAVENOUS

## 2022-01-05 MED ORDER — VANCOMYCIN HCL 1000 MG IV SOLR
INTRAVENOUS | Status: DC | PRN
  Administered 2022-01-05: 1000 mg

## 2022-01-05 MED ORDER — FENTANYL CITRATE (PF) 100 MCG/2ML IJ SOLN
INTRAMUSCULAR | Status: DC | PRN
  Administered 2022-01-05 (×3): 50 ug via INTRAVENOUS

## 2022-01-05 MED ORDER — SODIUM CHLORIDE 0.9 % IV SOLN
1000.0000 mg | Freq: Once | INTRAVENOUS | Status: DC
  Filled 2022-01-05: qty 20

## 2022-01-05 MED ORDER — ASPIRIN 81 MG PO CHEW
CHEWABLE_TABLET | ORAL | Status: AC
  Administered 2022-01-05: 81 mg via ORAL
  Filled 2022-01-05: qty 1

## 2022-01-05 MED ORDER — EPHEDRINE SULFATE (PRESSORS) 50 MG/ML IJ SOLN
INTRAMUSCULAR | Status: DC | PRN
  Administered 2022-01-05 (×5): 5 mg via INTRAVENOUS

## 2022-01-05 MED ORDER — HYDROCODONE-ACETAMINOPHEN 7.5-325 MG PO TABS
1.0000 | ORAL_TABLET | ORAL | Status: DC | PRN
  Filled 2022-01-05: qty 2

## 2022-01-05 MED ORDER — ACETAMINOPHEN 325 MG PO TABS: 325.0000 mg | ORAL_TABLET | Freq: Four times a day (QID) | ORAL | Status: DC | PRN

## 2022-01-05 MED ORDER — BISACODYL 5 MG PO TBEC
5.0000 mg | DELAYED_RELEASE_TABLET | Freq: Every day | ORAL | Status: DC | PRN
  Filled 2022-01-05: qty 1

## 2022-01-05 MED ORDER — VITAMIN D3 25 MCG (1000 UNIT) PO TABS
5000.0000 [IU] | ORAL_TABLET | Freq: Every day | ORAL | Status: DC
  Administered 2022-01-06 – 2022-01-07 (×2): 5000 [IU] via ORAL
  Filled 2022-01-05 (×3): qty 5

## 2022-01-05 MED ORDER — VANCOMYCIN HCL IN DEXTROSE 1-5 GM/200ML-% IV SOLN
1000.0000 mg | INTRAVENOUS | Status: AC
Start: 2022-01-05 — End: 2022-01-05

## 2022-01-05 MED ORDER — HYDROCODONE-ACETAMINOPHEN 5-325 MG PO TABS
1.0000 | ORAL_TABLET | ORAL | Status: DC | PRN
  Administered 2022-01-05 (×2): 1 via ORAL
  Administered 2022-01-06: 2 via ORAL
  Administered 2022-01-06 (×2): 1 via ORAL
  Administered 2022-01-07: 2 via ORAL
  Administered 2022-01-07: 1 via ORAL
  Administered 2022-01-07: 2 via ORAL
  Filled 2022-01-05: qty 2
  Filled 2022-01-05: qty 1
  Filled 2022-01-05 (×3): qty 2

## 2022-01-05 MED ORDER — CALCIUM CARBONATE ANTACID 500 MG PO CHEW
1.0000 | CHEWABLE_TABLET | Freq: Every day | ORAL | Status: DC | PRN
  Filled 2022-01-05: qty 1

## 2022-01-05 MED ORDER — DOCUSATE SODIUM 100 MG PO CAPS
ORAL_CAPSULE | ORAL | Status: AC
  Administered 2022-01-05: 100 mg via ORAL
  Filled 2022-01-05: qty 1

## 2022-01-05 MED ORDER — METOCLOPRAMIDE HCL 5 MG/ML IJ SOLN: 5.0000 mg | Freq: Three times a day (TID) | INTRAMUSCULAR | Status: DC | PRN

## 2022-01-05 MED ORDER — PHENOL 1.4 % MT LIQD
1.0000 | OROMUCOSAL | Status: DC | PRN
  Filled 2022-01-05: qty 177

## 2022-01-05 MED ORDER — ONDANSETRON HCL 4 MG PO TABS: 4.0000 mg | ORAL_TABLET | Freq: Four times a day (QID) | ORAL | Status: DC | PRN

## 2022-01-05 MED ORDER — PROPOFOL 500 MG/50ML IV EMUL
INTRAVENOUS | Status: DC | PRN
  Administered 2022-01-05: 75 ug/kg/min via INTRAVENOUS

## 2022-01-05 MED ORDER — PROPOFOL 1000 MG/100ML IV EMUL
INTRAVENOUS | Status: AC
  Filled 2022-01-05: qty 100

## 2022-01-05 MED ORDER — SODIUM CHLORIDE 0.9 % IV SOLN: INTRAVENOUS | Status: DC

## 2022-01-05 MED ORDER — LATANOPROST 0.005 % OP SOLN
1.0000 [drp] | Freq: Every day | OPHTHALMIC | Status: DC
  Administered 2022-01-06: 1 [drp] via OPHTHALMIC
  Filled 2022-01-05: qty 2.5

## 2022-01-05 MED ORDER — PHENYLEPHRINE HCL (PRESSORS) 10 MG/ML IV SOLN
INTRAVENOUS | Status: DC | PRN
Start: 2022-01-05 — End: 2022-01-05
  Administered 2022-01-05: 80 ug via INTRAVENOUS
  Administered 2022-01-05 (×2): 40 ug via INTRAVENOUS

## 2022-01-05 MED ORDER — FENTANYL CITRATE (PF) 100 MCG/2ML IJ SOLN: 25.0000 ug | INTRAMUSCULAR | Status: DC | PRN

## 2022-01-05 MED ORDER — VANCOMYCIN HCL IN DEXTROSE 1-5 GM/200ML-% IV SOLN
INTRAVENOUS | Status: AC
  Administered 2022-01-05: 1000 mg via INTRAVENOUS
  Filled 2022-01-05: qty 200

## 2022-01-05 MED ORDER — ONDANSETRON HCL 4 MG/2ML IJ SOLN: 4.0000 mg | Freq: Once | INTRAMUSCULAR | Status: DC | PRN

## 2022-01-05 MED ORDER — ENOXAPARIN SODIUM 30 MG/0.3ML IJ SOSY
30.0000 mg | PREFILLED_SYRINGE | Freq: Two times a day (BID) | INTRAMUSCULAR | Status: DC
  Administered 2022-01-06 – 2022-01-07 (×3): 30 mg via SUBCUTANEOUS
  Filled 2022-01-05 (×4): qty 0.3

## 2022-01-05 MED ORDER — BUPIVACAINE LIPOSOME 1.3 % IJ SUSP
INTRAMUSCULAR | Status: AC
  Filled 2022-01-05: qty 20

## 2022-01-05 MED ORDER — KETAMINE HCL 10 MG/ML IJ SOLN
INTRAMUSCULAR | Status: DC | PRN
  Administered 2022-01-05: 30 mg via INTRAVENOUS

## 2022-01-05 MED ORDER — MORPHINE SULFATE (PF) 4 MG/ML IV SOLN
0.5000 mg | INTRAVENOUS | Status: DC | PRN
  Administered 2022-01-05: 1 mg via INTRAVENOUS

## 2022-01-05 MED ORDER — MORPHINE SULFATE (PF) 10 MG/ML IV SOLN
INTRAVENOUS | Status: AC
  Filled 2022-01-05: qty 1

## 2022-01-05 MED ORDER — TRAMADOL HCL 50 MG PO TABS
50.0000 mg | ORAL_TABLET | Freq: Four times a day (QID) | ORAL | Status: DC
  Administered 2022-01-05 – 2022-01-07 (×7): 50 mg via ORAL
  Filled 2022-01-05 (×5): qty 1

## 2022-01-05 MED ORDER — BUPIVACAINE-EPINEPHRINE (PF) 0.25% -1:200000 IJ SOLN
INTRAMUSCULAR | Status: AC
  Filled 2022-01-05: qty 30

## 2022-01-05 MED ORDER — ASPIRIN 81 MG PO CHEW
81.0000 mg | CHEWABLE_TABLET | Freq: Every day | ORAL | Status: DC
  Administered 2022-01-06 – 2022-01-07 (×2): 81 mg via ORAL
  Filled 2022-01-05: qty 1

## 2022-01-05 MED ORDER — DIPHENHYDRAMINE HCL 12.5 MG/5ML PO ELIX
12.5000 mg | ORAL_SOLUTION | ORAL | Status: DC | PRN
  Filled 2022-01-05: qty 10

## 2022-01-05 MED ORDER — SODIUM CHLORIDE 0.9 % IR SOLN
Status: DC | PRN
  Administered 2022-01-05: 300 mL

## 2022-01-05 MED ORDER — METOCLOPRAMIDE HCL 10 MG PO TABS
5.0000 mg | ORAL_TABLET | Freq: Three times a day (TID) | ORAL | Status: DC | PRN
  Administered 2022-01-06: 10 mg via ORAL

## 2022-01-05 MED ORDER — SODIUM CHLORIDE FLUSH 0.9 % IV SOLN
INTRAVENOUS | Status: AC
  Filled 2022-01-05: qty 40

## 2022-01-05 MED ORDER — DOCUSATE SODIUM 100 MG PO CAPS
100.0000 mg | ORAL_CAPSULE | Freq: Two times a day (BID) | ORAL | Status: DC
  Administered 2022-01-06 – 2022-01-07 (×3): 100 mg via ORAL
  Filled 2022-01-05 (×2): qty 1

## 2022-01-05 MED ORDER — LACTATED RINGERS IV SOLN: INTRAVENOUS | Status: DC

## 2022-01-05 MED ORDER — PANTOPRAZOLE SODIUM 40 MG PO TBEC
40.0000 mg | DELAYED_RELEASE_TABLET | Freq: Every day | ORAL | Status: DC
  Administered 2022-01-05 – 2022-01-07 (×3): 40 mg via ORAL
  Filled 2022-01-05 (×4): qty 1

## 2022-01-05 MED ORDER — ACETAMINOPHEN 10 MG/ML IV SOLN
INTRAVENOUS | Status: DC | PRN
  Administered 2022-01-05: 1000 mg via INTRAVENOUS

## 2022-01-05 MED ORDER — ALUM & MAG HYDROXIDE-SIMETH 200-200-20 MG/5ML PO SUSP: 30.0000 mL | ORAL | Status: DC | PRN

## 2022-01-05 MED ORDER — ASCORBIC ACID 500 MG PO TABS
1000.0000 mg | ORAL_TABLET | Freq: Every day | ORAL | Status: DC
  Administered 2022-01-06 – 2022-01-07 (×2): 1000 mg via ORAL
  Filled 2022-01-05 (×2): qty 2

## 2022-01-05 SURGICAL SUPPLY — 68 items
ADAPTER OFFSET 5 (Connector) ×1 IMPLANT
AUG FEM DIST SZ3 12 (Knees) ×4 IMPLANT
AUGMENT FEM DIST SZ3 12 (Knees) IMPLANT
BASIN GRAD PLASTIC 32OZ STRL (MISCELLANEOUS) ×1 IMPLANT
BLADE SAW 90X13X1.19 OSCILLAT (BLADE) ×2 IMPLANT
BLADE SAW 90X25X1.19 OSCILLAT (BLADE) ×2 IMPLANT
BNDG ELASTIC 6X5.8 VLCR STR LF (GAUZE/BANDAGES/DRESSINGS) ×2 IMPLANT
CANISTER WOUND CARE 500ML ATS (WOUND CARE) ×2 IMPLANT
CHLORAPREP W/TINT 26 (MISCELLANEOUS) ×4 IMPLANT
COOLER POLAR GLACIER W/PUMP (MISCELLANEOUS) ×2 IMPLANT
COVER BACK TABLE REUSABLE LG (DRAPES) ×2 IMPLANT
CUFF TOURN SGL QUICK 24 (TOURNIQUET CUFF)
CUFF TOURN SGL QUICK 34 (TOURNIQUET CUFF)
CUFF TRNQT CYL 24X4X16.5-23 (TOURNIQUET CUFF) IMPLANT
CUFF TRNQT CYL 34X4.125X (TOURNIQUET CUFF) IMPLANT
DRAPE 3/4 80X56 (DRAPES) ×8 IMPLANT
ELECT CAUTERY BLADE 6.4 (BLADE) ×2 IMPLANT
ELECT REM PT RETURN 9FT ADLT (ELECTROSURGICAL) ×2
ELECTRODE REM PT RTRN 9FT ADLT (ELECTROSURGICAL) ×1 IMPLANT
FEMORAL COMPONENT REV PS L S3 (Femur) ×1 IMPLANT
GAUZE SPONGE 4X4 12PLY STRL (GAUZE/BANDAGES/DRESSINGS) ×1 IMPLANT
GAUZE XEROFORM 1X8 LF (GAUZE/BANDAGES/DRESSINGS) ×1 IMPLANT
GLOVE SURG ORTHO LTX SZ8 (GLOVE) ×2 IMPLANT
GLOVE SURG SYN 9.0  PF PI (GLOVE) ×1
GLOVE SURG SYN 9.0 PF PI (GLOVE) ×1 IMPLANT
GLOVE SURG UNDER LTX SZ8 (GLOVE) ×2 IMPLANT
GLOVE SURG UNDER POLY LF SZ9 (GLOVE) ×2 IMPLANT
GOWN SRG 2XL LVL 4 RGLN SLV (GOWNS) ×1 IMPLANT
GOWN STRL NON-REIN 2XL LVL4 (GOWNS) ×1
GOWN STRL REUS W/ TWL LRG LVL3 (GOWN DISPOSABLE) ×1 IMPLANT
GOWN STRL REUS W/ TWL XL LVL3 (GOWN DISPOSABLE) ×1 IMPLANT
GOWN STRL REUS W/TWL LRG LVL3 (GOWN DISPOSABLE) ×1
GOWN STRL REUS W/TWL XL LVL3 (GOWN DISPOSABLE) ×1
HOLDER FOLEY CATH W/STRAP (MISCELLANEOUS) ×2 IMPLANT
INSERT TIB ~~LOC~~ SZ2 20 (Insert) ×1 IMPLANT
IV NS IRRIG 3000ML ARTHROMATIC (IV SOLUTION) ×2 IMPLANT
KIT PREVENA INCISION MGT20CM45 (CANNISTER) ×2 IMPLANT
KIT STIMULAN RAPID CURE 5CC (Orthopedic Implant) ×1 IMPLANT
KIT TURNOVER KIT A (KITS) ×2 IMPLANT
KNIFE SCULPS 14X20 (INSTRUMENTS) ×2 IMPLANT
MANIFOLD NEPTUNE II (INSTRUMENTS) ×2 IMPLANT
NDL SPNL 18GX3.5 QUINCKE PK (NEEDLE) ×1 IMPLANT
NDL SPNL 20GX3.5 QUINCKE YW (NEEDLE) ×1 IMPLANT
NEEDLE SPNL 18GX3.5 QUINCKE PK (NEEDLE) IMPLANT
NEEDLE SPNL 20GX3.5 QUINCKE YW (NEEDLE) ×2 IMPLANT
NS IRRIG 500ML POUR BTL (IV SOLUTION) ×1 IMPLANT
PACK TOTAL KNEE (MISCELLANEOUS) ×2 IMPLANT
PAD WRAPON POLAR KNEE (MISCELLANEOUS) ×2 IMPLANT
PULSAVAC PLUS IRRIG FAN TIP (DISPOSABLE) ×2
SCALPEL PROTECTED #10 DISP (BLADE) ×4 IMPLANT
STAPLER SKIN PROX 35W (STAPLE) ×2 IMPLANT
STEM EXTEN REV 14MMX105L (Stem) ×1 IMPLANT
SUCTION FRAZIER HANDLE 10FR (MISCELLANEOUS) ×1
SUCTION TUBE FRAZIER 10FR DISP (MISCELLANEOUS) ×1 IMPLANT
SUT DVC 2 QUILL PDO  T11 36X36 (SUTURE) ×1
SUT DVC 2 QUILL PDO T11 36X36 (SUTURE) ×1 IMPLANT
SUT ETHIBOND 2 V 37 (SUTURE) ×1 IMPLANT
SUT TICRON 2-0 30IN 311381 (SUTURE) IMPLANT
SUT V-LOC 90 ABS DVC 3-0 CL (SUTURE) ×2 IMPLANT
SWAB CULTURE AMIES ANAERIB BLU (MISCELLANEOUS) IMPLANT
SYR 20ML LL LF (SYRINGE) ×1 IMPLANT
SYR 50ML LL SCALE MARK (SYRINGE) ×4 IMPLANT
TIP FAN IRRIG PULSAVAC PLUS (DISPOSABLE) ×1 IMPLANT
TOWEL OR 17X26 4PK STRL BLUE (TOWEL DISPOSABLE) ×2 IMPLANT
TOWER CARTRIDGE SMART MIX (DISPOSABLE) ×2 IMPLANT
TRAY FOLEY MTR SLVR 16FR STAT (SET/KITS/TRAYS/PACK) ×2 IMPLANT
WATER STERILE IRR 1000ML POUR (IV SOLUTION) ×1 IMPLANT
WRAPON POLAR PAD KNEE (MISCELLANEOUS) ×4

## 2022-01-05 NOTE — Anesthesia Procedure Notes (Signed)
Spinal  Patient location during procedure: OR Start time: 01/05/2022 9:54 AM End time: 01/05/2022 9:57 AM Reason for block: surgical anesthesia Staffing Performed: resident/CRNA  Anesthesiologist: Molli Barrows, MD Resident/CRNA: Demetrius Charity, CRNA Preanesthetic Checklist Completed: patient identified, IV checked, site marked, risks and benefits discussed, surgical consent, monitors and equipment checked, pre-op evaluation and timeout performed Spinal Block Patient position: sitting Prep: ChloraPrep Patient monitoring: heart rate, continuous pulse ox, blood pressure and cardiac monitor Approach: midline Location: L3-4 Injection technique: single-shot Needle Needle type: Whitacre and Introducer  Needle gauge: 25 G Needle length: 9 cm Assessment Sensory level: T10 Events: CSF return Additional Notes Sterile aseptic technique used throughout the procedure.  Negative paresthesia. Negative blood return. Positive free-flowing CSF. Expiration date of kit checked and confirmed. Patient tolerated procedure well, without complications.

## 2022-01-05 NOTE — Op Note (Signed)
01/05/2022  12:08 PM  PATIENT:  Bonnie Owens  71 y.o. female  PRE-OPERATIVE DIAGNOSIS:  Primary osteoarthritis of left knee M17.12 Instability of internal left knee prosthesis, initial encounter  T84.023A  POST-OPERATIVE DIAGNOSIS: Instability left total knee  PROCEDURE:  Procedure(s): Left knee revision, femoral component and tibial insert (Left)  SURGEON: Laurene Footman, MD  ASSISTANTS: Rachelle Hora, PA-C  ANESTHESIA:   spinal  EBL:  Total I/O In: -  Out: 275 [Urine:200; Blood:75]  BLOOD ADMINISTERED:none  DRAINS:  Incisional wound VAC    LOCAL MEDICATIONS USED:  MARCAINE    and OTHER morphine and Exparel  SPECIMEN:  No Specimen  DISPOSITION OF SPECIMEN:  N/A  COUNTS:  YES  TOURNIQUET:   Total Tourniquet Time Documented: Thigh (Left) - 35 minutes Total: Thigh (Left) - 35 minutes   IMPLANTS: Medacta GM K revision femoral component size 3 left PS, distal femoral augments 12 mm medial and lateral with a 14 x 105 stem and 5 mm offset connection with size to 20 mm semiconstrained tibial insert  DICTATION: .Dragon Dictation patient was brought to the operating room and after adequate spinal anesthesia was obtained the left leg was prepped and draped in the usual sterile fashion.  Start the case after appropriate patient identification and timeout procedures were completed most of the prior incision was opened except the most distal extent.  Medial arthrotomy was then performed and inspection revealed instability worse in extension than flexion.  Scar was removed from behind the patella for mobilization and the prior tibial insert was removed first removing the screw and then removing the insert.  This allowed for better exposure of the distal femoral component and within flexible osteotomes the cement metal interface was broken and the femoral component removed without any loss of bone.  Next the distal femoral drill hole was placed and hand reaming carried out up to 14 mm  followed by a distal cut removing cement to freshen the edges ends up followed by the anterior and posterior cuts with minimal resection of bone on either side.  The box cut was then carried out after determining a 5 mm offset was required and this was done prior to the anterior and posterior cuts.  12 mm augment trials were placed with a stem trial and a 20 mm insert gave excellent stability.  The trial components were removed and the bony surfaces thoroughly irrigated and dried at this point the tourniquet was raised after infiltrating the above medication.  After this is getting cement mixed it was placed on the distal femur as well as in the canal and the component was impacted into position with excess cement removed.  The component was held in extension with a trial 20 mm insert.  When the cemented set and excess cement had been removed range of motion showed full extension and good stability throughout the range of motion and so the 20 mm final component was inserted after thorough irrigation of the joint.  The 20 mm insert was impacted into place and setscrew tightened with a torque screwdriver.  Tourniquet was let down and the arthrotomy was repaired after placing Stimulan beads with vancomycin in the wound to help prevent postop infection.  The wound was closed with #2 Ethibond along the medial capsule followed by a heavy Quill for the capsule 3 oh V-Loc subcutaneously skin staples and incisional wound VAC followed by Polar Care.  PLAN OF CARE: Admit to inpatient   PATIENT DISPOSITION:  PACU - hemodynamically  stable.

## 2022-01-05 NOTE — Transfer of Care (Signed)
Immediate Anesthesia Transfer of Care Note  Patient: Bonnie Owens  Procedure(s) Performed: Left knee revision, femoral component and tibial insert (Left: Knee)  Patient Location: PACU  Anesthesia Type:Spinal  Level of Consciousness: awake and alert   Airway & Oxygen Therapy: Patient Spontanous Breathing and Patient connected to nasal cannula oxygen  Post-op Assessment: Report given to RN and Post -op Vital signs reviewed and stable  Post vital signs: Reviewed and stable  Last Vitals:  Vitals Value Taken Time  BP 110/66 01/05/22 1205  Temp    Pulse 93 01/05/22 1207  Resp 17 01/05/22 1207  SpO2 98 % 01/05/22 1207  Vitals shown include unvalidated device data.  Last Pain:  Vitals:   01/05/22 0801  TempSrc: Oral         Complications: No notable events documented.

## 2022-01-05 NOTE — Progress Notes (Signed)
PT Cancellation Note  Patient Details Name: Bonnie Owens MRN: [id number] DOB: 06-06-1951   Cancelled Treatment:    Reason Eval/Treat Not Completed: Patient not medically ready. Consult received and chart reviewed. Pt in PACU with RN at bedside. Pt reporting lack of sensation in LLE; PT observed decreased DF ROM. Will hold PT evaluation at this time and perform when pt is medically ready.    Patrina Levering PT, DPT 01/05/22 3:17 PM [phone]

## 2022-01-05 NOTE — H&P (Signed)
Chief Complaint  Patient presents with   Knee Pain  H&P left knee revision (femoral component and tibial insert) 01/05/22    History of the Present Illness: Bonnie Owens is a 71 y.o. female here today for history and physical for left total knee revision of femoral component and tibial insert, date of surgery 01/05/2022 with Dr. Hessie Knows. She underwent primary left total knee arthroplasty in 2017, GM K was 3+ left femoral component, 2 tibia with short stem, 20 mm flexed insert with a size 2 patella. Patient states over the last couple of months she has been having increased pain and swelling throughout the left knee with instability. Patient is left knee will throb and ache. She is having a hard time with mobility. Pain is moderate.  The patient enjoys gardening and yard work.  I have reviewed past medical, surgical, social and family history, and allergies as documented in the EMR.  Past Medical History: Past Medical History:  Diagnosis Date   IBS (irritable bowel syndrome)   Migraines   Osteopenia   Past Surgical History: Past Surgical History:  Procedure Laterality Date   APPENDECTOMY 1971   HYSTERECTOMY 1986   ARTHROPLASTY TOTAL KNEE Left 03/09/2016  Dr.Aashrith Eves   COLONOSCOPY 11/14/2017  Diverticulosis/Normal Colon/FHx CP-Brother/Repeat 83yrs/TKT   EGD 11/20/2018  Gastritis/Repeat PRN/TKT   COLONOSCOPY 09/01/2019  Diverticulosis/Redundant colon/FHx CP/Repeat 43yrs/MUS   Bladder mesh Fall 2008   Bladder tack   COLONOSCOPY   Lumbar surgery 1995   Past Family History: Family History  Problem Relation Age of Onset   Cancer Father   Medications: Current Outpatient Medications Ordered in Epic  Medication Sig Dispense Refill   albuterol 90 mcg/actuation inhaler Inhale 2 inhalations into the lungs every 6 (six) hours as needed for Wheezing.   ascorbic acid (VITAMIN C) 1000 MG tablet Take 1,000 mg by mouth once daily.   aspirin 81 MG chewable tablet Take 81 mg by mouth  once daily.   cholecalciferol (VITAMIN D3) 1,000 unit capsule Take 1,000 Units by mouth 2 (two) times daily.   cyanocobalamin (VITAMIN B12) 1000 MCG tablet Take 1,000 mcg by mouth once daily.   diltiazem (CARDIZEM CD) 120 MG XR capsule Take 1 capsule (120 mg total) by mouth once daily 30 capsule 0   fluticasone (FLONASE) 50 mcg/actuation nasal spray Place 2 sprays into both nostrils as needed for Rhinitis.   GLUCOSAMINE HCL/CHONDR SU A NA (OSTEO BI-FLEX ORAL) Take by mouth once daily.   ibandronate (BONIVA) 150 mg tablet Take 1 tablet by mouth monthly.   ibuprofen (ADVIL,MOTRIN) 200 MG tablet Take 400 mg by mouth as needed for Pain.   polyethylene glycol (MIRALAX) powder Take 17 g by mouth once daily Mix in 4-8ounces of fluid prior to taking.   SUMAtriptan (IMITREX) 100 MG tablet Take 1 tablet by mouth as needed.   No current Epic-ordered facility-administered medications on file.   Allergies: No Known Allergies   Body mass index is 25.24 kg/m.  Review of Systems: A comprehensive 14 point ROS was performed, reviewed, and the pertinent orthopaedic findings are documented in the HPI.  Vitals:  12/26/21 1116  BP: 124/70    General Physical Examination:  General:  Well developed, well nourished, no apparent distress, normal affect, minimal antalgia gait no assistive devices.  HEENT: Head normocephalic, atraumatic, PERRL.   Abdomen: Soft, non tender, non distended, Bowel sounds present.  Heart: Examination of the heart reveals regular, rate, and rhythm. There is no murmur noted on ascultation. There is  a normal apical pulse.  Lungs: Lungs are clear to auscultation. There is no wheeze, rhonchi, or crackles. There is normal expansion of bilateral chest walls.   Left knee: Left knee is tender along the medial and lateral joint line. Mild swelling. Normal hip internal and external rotation with no discomfort. She has laxity with valgus and varus stress testing in extension as well  as in mid flexion. She has 0 to 115 degrees range of motion of the left knee. Patella tracks well.  Radiographs: AP, lateral, standing, and sunrise x-rays of the left knee were reviewed by me today from 11/16/2021. These show a stable appearance to femoral and tibial components. Normal tracking of the patella with a thick polyethylene component.   Assessment: ICD-10-CM  1. Instability of internal left knee prosthesis, subsequent encounter T84.023D  2. Pain due to total left knee replacement, initial encounter (CMS-HCC) U72.53GU  Y40.347   Plan: 85. 71 year old female with history of left total knee arthroplasty in 2017. Has recently developed pain swelling and instability. Risks, benefits, complications of a left total knee revision of femoral and tibial insert have been discussed with the patient. Patient has agreed and consented to procedure with Dr. Hessie Knows on 01/05/2022.  Electronically signed by Feliberto Gottron, PA at 12/26/2021 12:52 PM EST     Reviewed  H+P. No changes noted.

## 2022-01-05 NOTE — TOC Progression Note (Signed)
Transition of Care [facility]) - Progression Note    Patient Details  Name: Bonnie Owens MRN: [id number] Date of Birth: March 30, 1951  Transition of Care [facility]) CM/SW [place], RN Phone Number: 01/05/2022, 2:05 PM  Clinical Narrative:   The patient is open with [facility] for [facility]         Expected Discharge Plan and Services                                                 Social Determinants of Health (SDOH) Interventions    Readmission Risk Interventions No flowsheet data found.

## 2022-01-05 NOTE — Anesthesia Preprocedure Evaluation (Signed)
Anesthesia Evaluation  Patient identified by MRN, date of birth, ID band Patient awake    Reviewed: Allergy & Precautions, H&P , NPO status , Patient's Chart, lab work & pertinent test results, reviewed documented beta blocker date and time   History of Anesthesia Complications (+) history of anesthetic complications  Airway Mallampati: II   Neck ROM: full    Dental  (+) Poor Dentition   Pulmonary shortness of breath and with exertion,    Pulmonary exam normal        Cardiovascular Exercise Tolerance: Poor + angina with exertion Normal cardiovascular exam Rhythm:regular Rate:Normal     Neuro/Psych PSYCHIATRIC DISORDERS Depression  Neuromuscular disease    GI/Hepatic Neg liver ROS, hiatal hernia, GERD  Medicated,  Endo/Other  Hypothyroidism   Renal/GU negative Renal ROS  negative genitourinary   Musculoskeletal   Abdominal   Peds  Hematology  (+) Blood dyscrasia, anemia ,   Anesthesia Other Findings Past Medical History: No date: Anemia No date: Anginal pain (HCC) No date: Chronic constipation No date: Complication of anesthesia     Comment:  Bad headache when waking No date: Depression No date: Dyspnea No date: GERD (gastroesophageal reflux disease) No date: Glaucoma No date: Headache     Comment:  occasional migraine No date: Hyperlipidemia No date: Left knee pain No date: Mitral valve prolapse No date: Osteopenia No date: Urine incontinence No date: Venous (peripheral) insufficiency No date: Vitamin D deficiency Past Surgical History: 1985: ABDOMINAL HYSTERECTOMY 1971: APPENDECTOMY No date: BACK SURGERY 10/2016: BASAL CELL CARCINOMA EXCISION; Left     Comment:  Underneath Left Eye- [place] [facility]- Dr. Nehemiah Massed No date: BLADDER SUSPENSION 11/14/2017: COLONOSCOPY; N/A     Comment:  Procedure: COLONOSCOPY;  Surgeon: Toledo, Benay Pike, MD;              Location: [facility] END[facility]COPY;  Service: Endoscopy;                 Laterality: N/A; 09/01/2019: COLONOSCOPY WITH PROPOFOL; N/A     Comment:  Procedure: COLONOSCOPY WITH PROPOFOL;  Surgeon:               Lollie Sails, MD;  Location: [facility] ENDOSCOPY;                Service: Endoscopy;  Laterality: N/A; 11/20/2018: ESOPHAGOGASTRODUODENOSCOPY (EGD) WITH PROPOFOL; N/A     Comment:  Procedure: ESOPHAGOGASTRODUODENOSCOPY (EGD) WITH               PROPOFOL;  Surgeon: Toledo, Benay Pike, MD;  Location:               [facility] ENDOSCOPY;  Service: Gastroenterology;  Laterality:               N/A; No date: JOINT REPLACEMENT; Left     Comment:  (LT) TKR 2017 1995: LUMBAR DISC SURGERY No date: sling procedure 03/09/2016: TOTAL KNEE ARTHROPLASTY; Left     Comment:  Procedure: TOTAL KNEE ARTHROPLASTY;  Surgeon: Hessie Knows, MD;  Location: [facility] ORS;  Service: Orthopedics;                Laterality: Left; 1980: TUBAL LIGATION No date: tummy tuck BMI    Body Mass Index: 26.15 kg/m     Reproductive/Obstetrics negative OB ROS  Anesthesia Physical Anesthesia Plan  ASA: 3  Anesthesia Plan: Spinal   Post-op Pain Management:    Induction:   PONV Risk Score and Plan: 4 or greater  Airway Management Planned:   Additional Equipment:   Intra-op Plan:   Post-operative Plan:   Informed Consent: I have reviewed the patients History and Physical, chart, labs and discussed the procedure including the risks, benefits and alternatives for the proposed anesthesia with the patient or authorized representative who has indicated his/her understanding and acceptance.     Dental Advisory Given  Plan Discussed with: CRNA  Anesthesia Plan Comments:         Anesthesia Quick Evaluation

## 2022-01-05 NOTE — Plan of Care (Signed)
°  Problem: Activity: Goal: Ability to avoid complications of mobility impairment will improve Outcome: Progressing Goal: Ability to tolerate increased activity will improve Outcome: Progressing   Problem: Education: Goal: Verbalization of understanding the information provided will improve Outcome: Progressing   Problem: Coping: Goal: Level of anxiety will decrease Outcome: Progressing   Problem: Physical Regulation: Goal: Postoperative complications will be avoided or minimized Outcome: Progressing   Problem: Respiratory: Goal: Ability to maintain a clear airway will improve Outcome: Progressing   Problem: Pain Management: Goal: Pain level will decrease Outcome: Progressing   Problem: Skin Integrity: Goal: Signs of wound healing will improve Outcome: Progressing   Problem: Tissue Perfusion: Goal: Ability to maintain adequate tissue perfusion will improve Outcome: Progressing   Problem: Health Behavior/Discharge Planning: Goal: Ability to manage health-related needs will improve Outcome: Progressing   Problem: Clinical Measurements: Goal: Ability to maintain clinical measurements within normal limits will improve Outcome: Progressing Goal: Will remain free from infection Outcome: Progressing Goal: Respiratory complications will improve Outcome: Progressing Goal: Cardiovascular complication will be avoided Outcome: Progressing   Problem: Activity: Goal: Risk for activity intolerance will decrease Outcome: Progressing   Problem: Nutrition: Goal: Adequate nutrition will be maintained Outcome: Progressing   Problem: Coping: Goal: Level of anxiety will decrease Outcome: Progressing   Problem: Elimination: Goal: Will not experience complications related to bowel motility Outcome: Progressing Goal: Will not experience complications related to urinary retention Outcome: Progressing   Problem: Pain Managment: Goal: General experience of comfort will  improve Outcome: Progressing   Problem: Safety: Goal: Ability to remain free from injury will improve Outcome: Progressing   Problem: Skin Integrity: Goal: Risk for impaired skin integrity will decrease Outcome: Progressing

## 2022-01-06 ENCOUNTER — Encounter: Payer: Self-pay | Admitting: Orthopedic Surgery

## 2022-01-06 LAB — BASIC METABOLIC PANEL
Anion gap: 6 (ref 5–15)
BUN: 10 mg/dL (ref 8–23)
CO2: 27 mmol/L (ref 22–32)
Calcium: 8.5 mg/dL — ABNORMAL LOW (ref 8.9–10.3)
Chloride: 101 mmol/L (ref 98–111)
Creatinine, Ser: 0.56 mg/dL (ref 0.44–1.00)
GFR, Estimated: >60 mL/min (ref >60)
Glucose, Bld: 123 mg/dL — ABNORMAL HIGH (ref 70–99)
Potassium: 4 mmol/L (ref 3.5–5.1)
Sodium: 134 mmol/L — ABNORMAL LOW (ref 135–145)

## 2022-01-06 LAB — CBC
HCT: 33.7 % — ABNORMAL LOW (ref 36.0–46.0)
Hemoglobin: 11.2 g/dL — ABNORMAL LOW (ref 12.0–15.0)
MCH: 32 pg (ref 26.0–34.0)
MCHC: 33.2 g/dL (ref 30.0–36.0)
MCV: 96.3 fL (ref 80.0–100.0)
Platelets: 256 10*3/uL (ref 150–400)
RBC: 3.5 MIL/uL — ABNORMAL LOW (ref 3.87–5.11)
RDW: 12.4 % (ref 11.5–15.5)
WBC: 8 10*3/uL (ref 4.0–10.5)
nRBC: 0 % (ref 0.0–0.2)

## 2022-01-06 MED ORDER — ENOXAPARIN SODIUM 40 MG/0.4ML IJ SOSY: 40.0000 mg | PREFILLED_SYRINGE | INTRAMUSCULAR | 0 refills | Status: DC

## 2022-01-06 MED ORDER — TRAMADOL HCL 50 MG PO TABS: 50.0000 mg | ORAL_TABLET | Freq: Four times a day (QID) | ORAL | 0 refills | Status: DC | PRN

## 2022-01-06 MED ORDER — METHOCARBAMOL 500 MG PO TABS: 500.0000 mg | ORAL_TABLET | Freq: Four times a day (QID) | ORAL | 0 refills | Status: DC | PRN

## 2022-01-06 MED ORDER — DOCUSATE SODIUM 100 MG PO CAPS: 100.0000 mg | ORAL_CAPSULE | Freq: Two times a day (BID) | ORAL | 0 refills | Status: DC

## 2022-01-06 MED ORDER — METOCLOPRAMIDE HCL 10 MG PO TABS
ORAL_TABLET | ORAL | Status: AC
  Filled 2022-01-06: qty 1

## 2022-01-06 MED ORDER — HYDROCODONE-ACETAMINOPHEN 5-325 MG PO TABS
ORAL_TABLET | ORAL | Status: AC
  Administered 2022-01-06: 1 via ORAL
  Filled 2022-01-06: qty 1

## 2022-01-06 MED ORDER — TRAMADOL HCL 50 MG PO TABS
ORAL_TABLET | ORAL | Status: AC
  Filled 2022-01-06: qty 1

## 2022-01-06 MED ORDER — MORPHINE SULFATE (PF) 4 MG/ML IV SOLN
INTRAVENOUS | Status: AC
  Administered 2022-01-06: 1 mg via INTRAVENOUS
  Filled 2022-01-06: qty 1

## 2022-01-06 MED ORDER — HYDROCODONE-ACETAMINOPHEN 5-325 MG PO TABS
ORAL_TABLET | ORAL | Status: AC
  Filled 2022-01-06: qty 1

## 2022-01-06 MED ORDER — ONDANSETRON HCL 4 MG/2ML IJ SOLN
INTRAMUSCULAR | Status: AC
  Administered 2022-01-06: 4 mg via INTRAVENOUS
  Filled 2022-01-06: qty 2

## 2022-01-06 MED ORDER — ASPIRIN 81 MG PO CHEW
CHEWABLE_TABLET | ORAL | Status: AC
  Filled 2022-01-06: qty 1

## 2022-01-06 MED ORDER — DOCUSATE SODIUM 100 MG PO CAPS
ORAL_CAPSULE | ORAL | Status: AC
  Filled 2022-01-06: qty 1

## 2022-01-06 MED ORDER — ONDANSETRON HCL 4 MG PO TABS: 4.0000 mg | ORAL_TABLET | Freq: Four times a day (QID) | ORAL | 0 refills | Status: DC | PRN

## 2022-01-06 MED ORDER — HYDROCODONE-ACETAMINOPHEN 5-325 MG PO TABS
1.0000 | ORAL_TABLET | ORAL | 0 refills | Status: DC | PRN
Start: 2022-01-06 — End: 2022-02-02

## 2022-01-06 NOTE — Discharge Instructions (Signed)

## 2022-01-06 NOTE — Progress Notes (Signed)
Alert,Ox4,VSS,  Pt very pleasant and cooperative. Tolerated PT and OT well, c/o minimal lightheadedness. Pt c/o surgical pain 6/10 medicated. Pt transferred to 131 via bed accompanied by RN, and tech. Husband in room. Report given to [employer] [facility] [employer]

## 2022-01-06 NOTE — Discharge Summary (Signed)
Physician Discharge Summary  Patient ID: Bonnie Owens MRN: [id number] DOB/AGE: 04-12-1951 71 y.o.  Admit date: 01/05/2022 Discharge date: 01/07/2022  Admission Diagnoses:  Status post revision of total replacement of left knee [Z96.652]   Discharge Diagnoses: Patient Active Problem List   Diagnosis Date Noted   Status post revision of total replacement of left knee 01/05/2022   Strain of right hip 07/07/2019   Gastritis 11/26/2018   Hiatal hernia 11/26/2018   Flushing 09/04/2018   Palpitations 03/19/2017   History of basal cell carcinoma 11/20/2016   Primary osteoarthritis of left knee 03/09/2016   History of total knee replacement, left 03/09/2016   Osteopenia 11/17/2015   Migraine without aura and without status migrainosus, not intractable 11/17/2015   Urge urinary incontinence 11/17/2015   Dyslipidemia 11/17/2015   Large breasts 11/17/2015   Menopause 11/17/2015   Vitamin D deficiency 11/17/2015   MVP (mitral valve prolapse) 11/17/2015   History of lumbar fusion 11/17/2015   Chronic constipation 11/17/2015   Venous insufficiency 11/17/2015   GERD (gastroesophageal reflux disease) 11/17/2015   Eustachian tube dysfunction 11/17/2015   Sciatica of left side     Past Medical History:  Diagnosis Date   Anemia    Anginal pain (HCC)    Chronic constipation    Complication of anesthesia    Bad headache when waking   Depression    Dyspnea    GERD (gastroesophageal reflux disease)    Glaucoma    Headache    occasional migraine   Hyperlipidemia    Left knee pain    Mitral valve prolapse    Osteopenia    Urine incontinence    Venous (peripheral) insufficiency    Vitamin D deficiency      Transfusion: none   Consultants (if any):   Discharged Condition: Improved  Hospital Course: Bonnie Owens is an 71 y.o. female who was admitted 01/05/2022 with a diagnosis of Status post revision of total replacement of left knee and went to the operating room on  01/05/2022 and underwent the above named procedures.    Surgeries: Procedure(s): Left knee revision, femoral component and tibial insert on 01/05/2022 Patient tolerated the surgery well. Taken to PACU where she was stabilized and then transferred to the orthopedic floor.  Started on Lovenox 30 mg q 12 hrs. Foot pumps applied bilaterally at 80 mm. Heels elevated on bed with rolled towels. No evidence of DVT. Negative Homan. Physical therapy started on day #1 for gait training and transfer. OT started day #1 for ADL and assisted devices.  Patient's foley was d/c on day #1. Patient's IV was d/c on day #2.  On post op day #2 patient was stable and ready for discharge to home with HHPT.  Implants: Medacta GM K revision femoral component size 3 left PS, distal femoral augments 12 mm medial and lateral with a 14 x 105 stem and 5 mm offset connection with size to 20 mm semiconstrained tibial insert    She was given perioperative antibiotics:  Anti-infectives (From admission, onward)    Start     Dose/Rate Route Frequency Ordered Stop   01/05/22 1530  ceFAZolin (ANCEF) IVPB 2g/100 mL premix        2 g 200 mL/hr over 30 Minutes Intravenous Every 6 hours 01/05/22 1525 01/05/22 2158   01/05/22 1025  vancomycin (VANCOCIN) powder  Status:  Discontinued          As needed 01/05/22 1025 01/05/22 1200   01/05/22 0500  vancomycin (  VANCOCIN) IVPB 1000 mg/200 mL premix        1,000 mg 200 mL/hr over 60 Minutes Intravenous 60 min pre-op 01/05/22 0027 01/05/22 0930   01/05/22 0030  vancomycin (VANCOCIN) 1,000 mg in sodium chloride 0.9 % 250 mL IVPB  Status:  Discontinued        1,000 mg 250 mL/hr over 60 Minutes Intravenous  Once 01/05/22 0025 01/05/22 0026     .  She was given sequential compression devices, early ambulation, and Lovenox TEDs for DVT prophylaxis.  She benefited maximally from the hospital stay and there were no complications.    Recent vital signs:  Vitals:   01/07/22 0314 01/07/22  0746  BP: 129/72 131/74  Pulse: 95 84  Resp: 18 18  Temp: 98.6 F (37 C) 98.4 F (36.9 C)  SpO2: 92% 94%    Recent laboratory studies:  Lab Results  Component Value Date   HGB 11.2 (L) 01/06/2022   HGB 12.3 01/05/2022   HGB 14.5 12/27/2021   Lab Results  Component Value Date   WBC 8.0 01/06/2022   PLT 256 01/06/2022   Lab Results  Component Value Date   INR 0.96 02/23/2016   Lab Results  Component Value Date   NA 134 (L) 01/06/2022   K 4.0 01/06/2022   CL 101 01/06/2022   CO2 27 01/06/2022   BUN 10 01/06/2022   CREATININE 0.56 01/06/2022   GLUCOSE 123 (H) 01/06/2022    Discharge Medications:   Allergies as of 01/07/2022   No Known Allergies      Medication List     STOP taking these medications    aspirin 81 MG tablet       TAKE these medications    calcium carbonate 500 MG chewable tablet Commonly known as: TUMS - dosed in mg elemental calcium Chew 1 tablet by mouth daily as needed for indigestion or heartburn.   diltiazem 120 MG 24 hr capsule Commonly known as: CARDIZEM CD Take 1 capsule (120 mg total) by mouth daily.   docusate sodium 100 MG capsule Commonly known as: COLACE Take 1 capsule (100 mg total) by mouth 2 (two) times daily.   enoxaparin 40 MG/0.4ML injection Commonly known as: LOVENOX Inject 0.4 mLs (40 mg total) into the skin daily for 14 days.   fluticasone 50 MCG/ACT nasal spray Commonly known as: FLONASE Place 2 sprays into both nostrils daily. What changed:  when to take this reasons to take this   HYDROcodone-acetaminophen 5-325 MG tablet Commonly known as: NORCO/VICODIN Take 1-2 tablets by mouth every 4 (four) hours as needed for moderate pain (pain score 4-6).   ibandronate 150 MG tablet Commonly known as: BONIVA TAKE ONE TABLET BY MOUTH EVERY 30 DAYS   latanoprost 0.005 % ophthalmic solution Commonly known as: XALATAN Place 1 drop into both eyes at bedtime.   methocarbamol 500 MG tablet Commonly known as:  ROBAXIN Take 1 tablet (500 mg total) by mouth every 6 (six) hours as needed for muscle spasms.   ondansetron 4 MG tablet Commonly known as: ZOFRAN Take 1 tablet (4 mg total) by mouth every 6 (six) hours as needed for nausea.   OSTEO BI-FLEX JOINT SHIELD PO Take 1 tablet by mouth daily.   PEG 3350 17 GM/SCOOP Powd Take 17 g by mouth daily. What changed: See the new instructions.   SUMAtriptan 100 MG tablet Commonly known as: IMITREX Take 1 tablet (100 mg total) by mouth as needed.   traMADol 50 MG tablet  Commonly known as: ULTRAM Take 1 tablet (50 mg total) by mouth every 6 (six) hours as needed.   vitamin B-12 1000 MCG tablet Commonly known as: CYANOCOBALAMIN Take 1,000 mcg by mouth daily.   VITAMIN C PO Take 1,000 mg by mouth daily.   Vitamin D-3 125 MCG (5000 UT) Tabs Take 5,000 Units by mouth daily.               Durable Medical Equipment  (From admission, onward)           Start     Ordered   01/05/22 1526  DME Walker rolling  Once       Question Answer Comment  Walker: With 5 Inch Wheels   Patient needs a walker to treat with the following condition Status post revision of total replacement of left knee      01/05/22 1525   01/05/22 1526  DME 3 n 1  Once        01/05/22 1525   01/05/22 1526  DME Bedside commode  Once       Question:  Patient needs a bedside commode to treat with the following condition  Answer:  Status post revision of total replacement of left knee   01/05/22 1525            Diagnostic Studies: DG Knee 1-2 Views Left  Result Date: 01/05/2022 CLINICAL DATA:  Status post left knee replacement. EXAM: LEFT KNEE - 1-2 VIEW COMPARISON:  March 09, 2016. FINDINGS: The left femoral and tibial components appear to be well situated. Expected postoperative changes are noted in the soft tissues anteriorly. IMPRESSION: Status post revision of left total knee arthroplasty. Electronically Signed   By: Marijo Conception M.D.   On: 01/05/2022 12:54     Disposition:      Follow-up Information     Duanne Guess, PA-C Follow up in 2 week(s).   Specialties: Orthopedic Surgery, Emergency Medicine Contact information: [place] [address] [phone]                  Signed: Feliberto Gottron 01/07/2022, 8:21 AM

## 2022-01-06 NOTE — Progress Notes (Signed)
Patient received in room 131, husband at bedside. A&Ox4. Oriented to room and call bell. Denies needs at this time. Patient has voided since catheter removal.

## 2022-01-06 NOTE — Anesthesia Postprocedure Evaluation (Signed)
Anesthesia Post Note  Patient: Bonnie Owens  Procedure(s) Performed: Left knee revision, femoral component and tibial insert (Left: Knee)  Patient location during evaluation: Nursing Unit Anesthesia Type: Spinal Level of consciousness: awake Pain management: pain level controlled Respiratory status: spontaneous breathing Cardiovascular status: stable Postop Assessment: no headache Anesthetic complications: no   No notable events documented.   Last Vitals:  Vitals:   01/06/22 0000 01/06/22 0400  BP: 107/71 104/67  Pulse: 93 95  Resp: 16 16  Temp: (!) 36 C (!) 36.1 C  SpO2: 97% 98%    Last Pain:  Vitals:   01/06/22 0400  TempSrc: Temporal  PainSc:                  Lerry Liner

## 2022-01-06 NOTE — Progress Notes (Signed)
Physical Therapy Treatment Patient Details Name: Bonnie Owens MRN: [id number] DOB: 1951-07-01 Today's Date: 01/06/2022   History of Present Illness Bonnie Owens is a 71 y.o. female here today for history and physical for left total knee revision of femoral component and tibial insert, date of surgery 01/05/2022.    PT Comments    Pt received supine in bed, agreeable to therapy. She asks to defer stair training to tomorrow morning. Exercise packet was reviewed; active assist provided for some of the exercises due to difficulty obtaining significant ROM. At end of session, PT assisted pt to restroom. STS and ambulation have improved to SUP using RW. Pt continues to require MIN A with bed mobility. Pt was educated on technique to allow for independent bed mobility. Plan to perform stair training next session to ensure safety with returning home (2 STE w/ no railing). Husband is physically capable of providing assist if needed. Would benefit from skilled PT to address above deficits and promote optimal return to PLOF.    Recommendations for follow up therapy are one component of a multi-disciplinary discharge planning process, led by the attending physician.  Recommendations may be updated based on patient status, additional functional criteria and insurance authorization.  Follow Up Recommendations  Home health PT     Assistance Recommended at Discharge Intermittent Supervision/Assistance  Patient can return home with the following A little help with walking and/or transfers;A little help with bathing/dressing/bathroom;Assistance with cooking/housework;Assist for transportation;Help with stairs or ramp for entrance   Equipment Recommendations  None recommended by PT    Recommendations for Other Services       Precautions / Restrictions Precautions Precautions: Knee;Fall Restrictions Weight Bearing Restrictions: Yes LLE Weight Bearing: Weight bearing as tolerated      Mobility  Bed Mobility Overal bed mobility: Needs Assistance Bed Mobility: Sit to Supine, Supine to Sit     Supine to sit: Min assist Sit to supine: Min assist   General bed mobility comments: MIN A to manage LLE; pt educated on how to scoop LLE using RLE for increased independence    Transfers Overall transfer level: Needs assistance Equipment used: Rolling walker (2 wheels) Transfers: Sit to/from Stand Sit to Stand: Supervision           General transfer comment: SUP for safety; good technique    Ambulation/Gait Ambulation/Gait assistance: Supervision Gait Distance (Feet): 20 Feet Assistive device: Rolling walker (2 wheels) Gait Pattern/deviations: Step-through pattern, Decreased step length - right, Decreased stance time - left Gait velocity: decreased     General Gait Details: 73ft + 1ft for ambulatory toilet transfer; SUP for safety   Stairs             Wheelchair Mobility    Modified Rankin (Stroke Patients Only)       Balance Overall balance assessment: Needs assistance Sitting-balance support: No upper extremity supported, Feet supported Sitting balance-Leahy Scale: Good     Standing balance support: During functional activity, Bilateral upper extremity supported, Reliant on assistive device for balance Standing balance-Leahy Scale: Fair Standing balance comment: Reliant on RW during ambulation                            Cognition Arousal/Alertness: Awake/alert Behavior During Therapy: WFL for tasks assessed/performed Overall Cognitive Status: Within Functional Limits for tasks assessed  Exercises Total Joint Exercises Ankle Circles/Pumps: AROM, Left, 15 reps, Supine Quad Sets: AROM, Left, 15 reps, Supine, Strengthening (tactile cueing for improved quad activation) Short Arc Quad: AROM, Strengthening, Left, 15 reps, Supine Heel Slides: AROM, Strengthening, Left,  15 reps, Supine Hip ABduction/ADduction: AAROM, Left, 15 reps, Supine, Strengthening (AAROM to suuport weight of LE) Straight Leg Raises: AAROM, Left, 15 reps, Supine, Strengthening Long Arc Quad: AAROM, Strengthening, Left, 15 reps, Supine (AA for improved ROM) Knee Flexion: AAROM, Strengthening, Left, 15 reps, Supine    General Comments        Pertinent Vitals/Pain Pain Assessment Pain Assessment: Faces Pain Score: 7  Faces Pain Scale: Hurts little more Pain Location: L knee Pain Descriptors / Indicators: Discomfort, Dull Pain Intervention(s): Limited activity within patient's tolerance, Premedicated before session, Repositioned, Monitored during session    Home Living Family/patient expects to be discharged to:: Private residence Living Arrangements: Spouse/significant other Available Help at Discharge: Family;Available PRN/intermittently (initially available 24/7 then will return to work ~mid next week) Type of Home: House Home Access: Stairs to enter Entrance Stairs-Rails: None Entrance Stairs-Number of Steps: 2   Home Layout: Two level;Full bath on main level;Bed/bath upstairs (states she can sleep in a recliner on first floor) Home Equipment: Rolling Walker (2 wheels);Cane - single point;BSC/3in1      Prior Function            PT Goals (current goals can now be found in the care plan section) Acute Rehab PT Goals Patient Stated Goal: to go home PT Goal Formulation: With patient Time For Goal Achievement: 01/20/22 Potential to Achieve Goals: Good    Frequency    BID      PT Plan      Co-evaluation              AM-PAC PT "6 Clicks" Mobility   Outcome Measure  Help needed turning from your back to your side while in a flat bed without using bedrails?: None Help needed moving from lying on your back to sitting on the side of a flat bed without using bedrails?: A Little Help needed moving to and from a bed to a chair (including a wheelchair)?: A  Little Help needed standing up from a chair using your arms (e.g., wheelchair or bedside chair)?: A Little Help needed to walk in hospital room?: A Little Help needed climbing 3-5 steps with a railing? : A Little 6 Click Score: 19    End of Session Equipment Utilized During Treatment: Gait belt Activity Tolerance: Patient tolerated treatment well Patient left: in bed;with call bell/phone within reach Nurse Communication: Mobility status PT Visit Diagnosis: Unsteadiness on feet (R26.81);Other abnormalities of gait and mobility (R26.89);Muscle weakness (generalized) (M62.81);Difficulty in walking, not elsewhere classified (R26.2)     Time: 1700-1749 PT Time Calculation (min) (ACUTE ONLY): 33 min  Charges:  $Therapeutic Exercise: 8-22 mins $Therapeutic Activity: 8-22 mins                     Patrina Levering PT, DPT 01/06/22 2:52 PM [phone]

## 2022-01-06 NOTE — Evaluation (Signed)
Occupational Therapy Evaluation Patient Details Name: DEBANHI BLAKER MRN: [id number] DOB: [birth year] Today's Date: 01/06/2022   History of Present Illness MEHER KUCINSKI is a 71 y.o. female here today for history and physical for left total knee revision of femoral component and tibial insert, date of surgery 01/05/2022.   Clinical Impression   Ms Newsome was seen for OT evaluation this date. Prior to hospital admission, pt was Independnet for mobility and ADLs. Pt lives with spouse in 2 level home c 2 STE, plans to sleep on 1st floor with recliner and full bath. Pt currently requires MIN A exit R side of bed, husband assists. SUPERVISION doff B socks, MIN A don sitting EOB. SBA + RW for toilet t/f and perihygiene with lateral leans. CGA hand hygiene standing reaching outside BOS. Pt and family instructed in polar care mgt, falls prevention strategies, and compression stocking mgt. All education complete and no acute OT needs identified, sill sign off. Upon hospital discharge, recommend no OT follow up.        Recommendations for follow up therapy are one component of a multi-disciplinary discharge planning process, led by the attending physician.  Recommendations may be updated based on patient status, additional functional criteria and insurance authorization.   Follow Up Recommendations  No OT follow up    Assistance Recommended at Discharge Intermittent Supervision/Assistance  Patient can return home with the following A little help with walking and/or transfers;A little help with bathing/dressing/bathroom;Help with stairs or ramp for entrance    Functional Status Assessment  Patient has had a recent decline in their functional status and demonstrates the ability to make significant improvements in function in a reasonable and predictable amount of time.  Equipment Recommendations  BSC/3in1    Recommendations for Other Services       Precautions / Restrictions  Precautions Precautions: Knee;Fall Restrictions Weight Bearing Restrictions: Yes LLE Weight Bearing: Weight bearing as tolerated      Mobility Bed Mobility Overal bed mobility: Needs Assistance Bed Mobility: Supine to Sit     Supine to sit: Min assist, HOB elevated     General bed mobility comments: husband provides assist    Transfers Overall transfer level: Needs assistance Equipment used: Rolling walker (2 wheels) Transfers: Sit to/from Stand Sit to Stand: Min guard                  Balance Overall balance assessment: Needs assistance Sitting-balance support: No upper extremity supported, Feet supported Sitting balance-Leahy Scale: Good     Standing balance support: No upper extremity supported, During functional activity Standing balance-Leahy Scale: Fair                             ADL either performed or assessed with clinical judgement   ADL Overall ADL's : Needs assistance/impaired                                       General ADL Comments: SUPERVISION doff B socks, MIN A don sitting EOB. SBA + RW for toilet t/f and perihygiene with lateral leans. CGA hand hygiene standing reaching outside BOS.      Pertinent Vitals/Pain Pain Assessment Pain Assessment: 0-10 Pain Score: 5  Pain Location: L knee Pain Descriptors / Indicators: Discomfort, Dull Pain Intervention(s): Limited activity within patient's tolerance, Premedicated before session, Repositioned  Hand Dominance Right   Extremity/Trunk Assessment Upper Extremity Assessment Upper Extremity Assessment: Overall WFL for tasks assessed   Lower Extremity Assessment Lower Extremity Assessment: Generalized weakness       Communication Communication Communication: No difficulties   Cognition Arousal/Alertness: Awake/alert Behavior During Therapy: WFL for tasks assessed/performed Overall Cognitive Status: Within Functional Limits for tasks assessed                                                   Home Living Family/patient expects to be discharged to:: Private residence Living Arrangements: Spouse/significant other Available Help at Discharge: Family (initially available 24/7 then will return to work ~mid next week) Type of Home: House Home Access: Stairs to enter [employer] of Steps: 2   Home Layout: Two level;Full bath on main level;Bed/bath upstairs         Bathroom Toilet: Standard     Home Equipment: Conservation officer, nature (2 wheels);Cane - single point          Prior Functioning/Environment Prior Level of Function : Independent/Modified Independent             Mobility Comments: no AD use          OT Problem List: Decreased range of motion;Decreased activity tolerance;Impaired balance (sitting and/or standing)         OT Goals(Current goals can be found in the care plan section) Acute Rehab OT Goals Patient Stated Goal: to go home OT Goal Formulation: With patient/family Time For Goal Achievement: 01/20/22 Potential to Achieve Goals: Good   AM-PAC OT "6 Clicks" Daily Activity     Outcome Measure Help from another person eating meals?: None Help from another person taking care of personal grooming?: A Little Help from another person toileting, which includes using toliet, bedpan, or urinal?: A Little Help from another person bathing (including washing, rinsing, drying)?: A Little Help from another person to put on and taking off regular upper body clothing?: None Help from another person to put on and taking off regular lower body clothing?: A Little 6 Click Score: 20   End of Session Equipment Utilized During Treatment: Rolling walker (2 wheels) Nurse Communication: Mobility status  Activity Tolerance: Patient tolerated treatment well Patient left: in bed;with call bell/phone within reach;Other (comment);with family/visitor present (PT at bedside)  OT Visit Diagnosis: Other  abnormalities of gait and mobility (R26.89)                Time: 0254-2706 OT Time Calculation (min): 18 min Charges:  OT General Charges $OT Visit: 1 Visit OT Evaluation $OT Eval Low Complexity: 1 Low OT Treatments $Self Care/Home Management : 8-22 mins  Dessie Coma, M.S. OTR/L  01/06/22, 10:08 AM  ascom [phone]

## 2022-01-06 NOTE — Progress Notes (Signed)
° °  Subjective: 1 Day Post-Op Procedure(s) (LRB): Left knee revision, femoral component and tibial insert (Left) Patient reports pain as mild.   Patient is doing well with no complaints.  Mild nausea without vomiting.  Blood pressure soft, no dizziness or lightheadedness. Denies any CP, SOB, ABD pain. We will continue therapy today.  Plan is to go Home after hospital stay.  Objective: Vital signs in last 24 hours: Temp:  [95.4 F (35.2 C)-98.1 F (36.7 C)] 98.1 F (36.7 C) (02/03 0759) Pulse Rate:  [56-95] 92 (02/03 0759) Resp:  [10-28] 17 (02/03 0759) BP: (101-124)/(55-89) 124/64 (02/03 0759) SpO2:  [97 %-100 %] 98 % (02/03 0759)  Intake/Output from previous day: 02/02 0701 - 02/03 0700 In: 1947.3 [P.O.:90; I.V.:1758.3; IV Piggyback:99] Out: 1000 [Urine:925; Blood:75] Intake/Output this shift: No intake/output data recorded.  Recent Labs    01/05/22 1554 01/06/22 0635  HGB 12.3 11.2*   Recent Labs    01/05/22 1554 01/06/22 0635  WBC 12.0* 8.0  RBC 3.80* 3.50*  HCT 37.2 33.7*  PLT 253 256   Recent Labs    01/05/22 1554 01/06/22 0635  NA  --  134*  K  --  4.0  CL  --  101  CO2  --  27  BUN  --  10  CREATININE 0.54 0.56  GLUCOSE  --  123*  CALCIUM  --  8.5*   No results for input(s): LABPT, INR in the last 72 hours.  EXAM General - Patient is Alert, Appropriate, and Oriented Extremity - Neurovascular intact Sensation intact distally Intact pulses distally Dorsiflexion/Plantar flexion intact No cellulitis present Compartment soft Dressing - dressing C/D/I and no drainage, Praveena intact without drainage Motor Function - intact, moving foot and toes well on exam.   Past Medical History:  Diagnosis Date   Anemia    Anginal pain (HCC)    Chronic constipation    Complication of anesthesia    Bad headache when waking   Depression    Dyspnea    GERD (gastroesophageal reflux disease)    Glaucoma    Headache    occasional migraine    Hyperlipidemia    Left knee pain    Mitral valve prolapse    Osteopenia    Urine incontinence    Venous (peripheral) insufficiency    Vitamin D deficiency     Assessment/Plan:   1 Day Post-Op Procedure(s) (LRB): Left knee revision, femoral component and tibial insert (Left) Principal Problem:   Status post revision of total replacement of left knee  Estimated body mass index is 26.15 kg/m as calculated from the following:   Height as of this encounter: 5\' 2"  (1.575 m).   Weight as of this encounter: 64.9 kg. Advance diet Up with therapy Pain well controlled Labs and vital signs are stable. Nausea -Zofran and Reglan ordered.  We will continue to monitor.  Currently without vomiting.  Tolerating p.o. Care management to assist with discharge to home with home health PT.  Most likely will discharge to home Saturday  DVT Prophylaxis - Lovenox, TED hose, and SCDs Weight-Bearing as tolerated to left leg   T. Rachelle Hora, PA-C [place] 01/06/2022, 8:04 AM

## 2022-01-06 NOTE — Plan of Care (Signed)
°  Problem: Education: Goal: Verbalization of understanding the information provided will improve Outcome: Progressing   Problem: Pain Management: Goal: Pain level will decrease Outcome: Progressing   Problem: Skin Integrity: Goal: Signs of wound healing will improve Outcome: Progressing   Problem: Pain Managment: Goal: General experience of comfort will improve Outcome: Progressing   Problem: Safety: Goal: Ability to remain free from injury will improve Outcome: Progressing   Problem: Skin Integrity: Goal: Risk for impaired skin integrity will decrease Outcome: Progressing

## 2022-01-06 NOTE — Evaluation (Signed)
Physical Therapy Evaluation Patient Details Name: Bonnie Owens MRN: [id number] DOB: 02-17-1951 Today's Date: 01/06/2022  History of Present Illness  Bonnie Owens is a 71 y.o. female here today for history and physical for left total knee revision of femoral component and tibial insert, date of surgery 01/05/2022.   Clinical Impression  Pt received sitting EOB with OT. Pt reported lightheadedness with OT after ambulating to restroom. BP recorded at  124/80 upon entering room. Pt husband is present and supportive throughout. Once pt was feeling better, pt ambulated 19ft. Light steadying provided during both STS and ambulation. She had difficulty with bed mobility; PT lifted LLE as pt performed the remainder of sit>supine. Plan to review exercise packet and perform 2 STE in afternoon session. Rec pt d/c home with assist of family as needed; pt has all DME required. HHPT to address strength, endurance and stability of LLE.      Recommendations for follow up therapy are one component of a multi-disciplinary discharge planning process, led by the attending physician.  Recommendations may be updated based on patient status, additional functional criteria and insurance authorization.  Follow Up Recommendations Home health PT    Assistance Recommended at Discharge Intermittent Supervision/Assistance  Patient can return home with the following  A little help with walking and/or transfers;A little help with bathing/dressing/bathroom;Assistance with cooking/housework;Assist for transportation;Help with stairs or ramp for entrance    Equipment Recommendations None recommended by PT  Recommendations for Other Services       Functional Status Assessment Patient has had a recent decline in their functional status and demonstrates the ability to make significant improvements in function in a reasonable and predictable amount of time.     Precautions / Restrictions Precautions Precautions:  Knee;Fall Restrictions Weight Bearing Restrictions: Yes LLE Weight Bearing: Weight bearing as tolerated      Mobility  Bed Mobility Overal bed mobility: Needs Assistance Bed Mobility: Sit to Supine       Sit to supine: Min assist   General bed mobility comments: MIN A to manage LLE back to bed    Transfers Overall transfer level: Needs assistance Equipment used: Rolling walker (2 wheels) Transfers: Sit to/from Stand Sit to Stand: Min guard           General transfer comment: CGA for safety, no physical assistance. Pt demo good technique and hand placement.    Ambulation/Gait Ambulation/Gait assistance: Min guard Gait Distance (Feet): 120 Feet Assistive device: Rolling walker (2 wheels) Gait Pattern/deviations: Step-through pattern, Decreased step length - right, Decreased stance time - left Gait velocity: decreased     General Gait Details: 175ft using RW, CGA for minor steadying. Limited by pain, nausea and lightheadedness.  Stairs            Wheelchair Mobility    Modified Rankin (Stroke Patients Only)       Balance Overall balance assessment: Needs assistance Sitting-balance support: No upper extremity supported, Feet supported Sitting balance-Leahy Scale: Good     Standing balance support: During functional activity, Bilateral upper extremity supported, Reliant on assistive device for balance Standing balance-Leahy Scale: Fair Standing balance comment: Reliant on RW during ambulation                             Pertinent Vitals/Pain Pain Assessment Pain Assessment: 0-10 Pain Score: 7  Pain Location: L knee Pain Descriptors / Indicators: Discomfort, Dull Pain Intervention(s): Limited activity within patient's tolerance, Premedicated before  session, Repositioned, Monitored during session    [place] expects to be discharged to:: Private residence Living Arrangements: Spouse/significant other Available Help  at Discharge: Family;Available PRN/intermittently (initially available 24/7 then will return to work ~mid next week) Type of Home: House Home Access: Stairs to enter Entrance Stairs-Rails: None Entrance Stairs-Number of Steps: 2   Home Layout: Two level;Full bath on main level;Bed/bath upstairs (states she can sleep in a recliner on first floor) Home Equipment: Rolling Walker (2 wheels);Cane - single point;BSC/3in1      Prior Function Prior Level of Function : Independent/Modified Independent             Mobility Comments: no AD use       Hand Dominance   Dominant Hand: Right    Extremity/Trunk Assessment   Upper Extremity Assessment Upper Extremity Assessment: Overall WFL for tasks assessed    Lower Extremity Assessment Lower Extremity Assessment: LLE deficits/detail;Overall WFL for tasks assessed (Strong RLE) LLE Deficits / Details: L TKA revision LLE: Unable to fully assess due to pain LLE Sensation: WNL       Communication   Communication: No difficulties  Cognition Arousal/Alertness: Awake/alert Behavior During Therapy: WFL for tasks assessed/performed Overall Cognitive Status: Within Functional Limits for tasks assessed                                          General Comments      Exercises     Assessment/Plan    PT Assessment Patient needs continued PT services  PT Problem List Decreased strength;Decreased mobility;Decreased range of motion;Decreased activity tolerance;Decreased balance       PT Treatment Interventions DME instruction;Therapeutic activities;Gait training;Therapeutic exercise;Stair training;Balance training;Functional mobility training    PT Goals (Current goals can be found in the Care Plan section)  Acute Rehab PT Goals Patient Stated Goal: to go home PT Goal Formulation: With patient Time For Goal Achievement: 01/20/22 Potential to Achieve Goals: Good    Frequency BID     Co-evaluation                AM-PAC PT "6 Clicks" Mobility  Outcome Measure Help needed turning from your back to your side while in a flat bed without using bedrails?: None Help needed moving from lying on your back to sitting on the side of a flat bed without using bedrails?: A Little Help needed moving to and from a bed to a chair (including a wheelchair)?: A Little Help needed standing up from a chair using your arms (e.g., wheelchair or bedside chair)?: A Little Help needed to walk in hospital room?: A Little Help needed climbing 3-5 steps with a railing? : A Little 6 Click Score: 19    End of Session Equipment Utilized During Treatment: Gait belt Activity Tolerance: Patient tolerated treatment well;Patient limited by pain Patient left: in bed;with call bell/phone within reach;with family/visitor present;with nursing/sitter in room Nurse Communication: Mobility status PT Visit Diagnosis: Unsteadiness on feet (R26.81);Other abnormalities of gait and mobility (R26.89);Muscle weakness (generalized) (M62.81);Difficulty in walking, not elsewhere classified (R26.2)    Time: 0623-7628 PT Time Calculation (min) (ACUTE ONLY): 14 min   Charges:   PT Evaluation $PT Eval Low Complexity: 1 Low          Ahmed Inniss PT, DPT 01/06/22 1:08 PM [phone]

## 2022-01-07 NOTE — Progress Notes (Signed)
° °  Subjective: 2 Days Post-Op Procedure(s) (LRB): Left knee revision, femoral component and tibial insert (Left) Patient reports pain as mild.   Patient is doing well with no complaints. No Nausea this am Denies any CP, SOB, ABD pain. We will continue therapy today.  Plan is to go Home after hospital stay.  Objective: Vital signs in last 24 hours: Temp:  [97.6 F (36.4 C)-98.6 F (37 C)] 98.4 F (36.9 C) (02/04 0746) Pulse Rate:  [84-96] 84 (02/04 0746) Resp:  [18-19] 18 (02/04 0746) BP: (124-148)/(64-85) 131/74 (02/04 0746) SpO2:  [92 %-99 %] 94 % (02/04 0746)  Intake/Output from previous day: 02/03 0701 - 02/04 0700 In: 800 [P.O.:800] Out: -  Intake/Output this shift: No intake/output data recorded.  Recent Labs    01/05/22 1554 01/06/22 0635  HGB 12.3 11.2*   Recent Labs    01/05/22 1554 01/06/22 0635  WBC 12.0* 8.0  RBC 3.80* 3.50*  HCT 37.2 33.7*  PLT 253 256   Recent Labs    01/05/22 1554 01/06/22 0635  NA  --  134*  K  --  4.0  CL  --  101  CO2  --  27  BUN  --  10  CREATININE 0.54 0.56  GLUCOSE  --  123*  CALCIUM  --  8.5*   No results for input(s): LABPT, INR in the last 72 hours.  EXAM General - Patient is Alert, Appropriate, and Oriented Extremity - Neurovascular intact Sensation intact distally Intact pulses distally Dorsiflexion/Plantar flexion intact No cellulitis present Compartment soft Dressing - dressing C/D/I and no drainage, Praveena intact without drainage Motor Function - intact, moving foot and toes well on exam.   Past Medical History:  Diagnosis Date   Anemia    Anginal pain (HCC)    Chronic constipation    Complication of anesthesia    Bad headache when waking   Depression    Dyspnea    GERD (gastroesophageal reflux disease)    Glaucoma    Headache    occasional migraine   Hyperlipidemia    Left knee pain    Mitral valve prolapse    Osteopenia    Urine incontinence    Venous (peripheral) insufficiency     Vitamin D deficiency     Assessment/Plan:   2 Days Post-Op Procedure(s) (LRB): Left knee revision, femoral component and tibial insert (Left) Principal Problem:   Status post revision of total replacement of left knee  Estimated body mass index is 26.15 kg/m as calculated from the following:   Height as of this encounter: 5\' 2"  (1.575 m).   Weight as of this encounter: 64.9 kg. Advance diet Up with therapy Pain well controlled Vital signs are stable. Nausea resolved Care management to assist with discharge to home with home health PT today pending completion of PT goals  DVT Prophylaxis - Lovenox, TED hose, and SCDs Weight-Bearing as tolerated to left leg   T. Rachelle Hora, PA-C [place] 01/07/2022, 8:19 AM

## 2022-01-07 NOTE — Progress Notes (Signed)
Patient to be discharged home via private vehicle. Patient ready in room, just waiting for 3 in 1 to be delivered. Discharge instructions reviewed with patient and husband. Prevena placed. Questions and concerns answered.

## 2022-01-07 NOTE — Progress Notes (Signed)
Patient could not wait for 3  in 1 to be delivered. LCSW notified and made changes to have equipment delivered home.

## 2022-01-07 NOTE — TOC Initial Note (Signed)
Transition of Care [facility]) - Initial/Assessment Note    Patient Details  Name: Bonnie Owens MRN: [id number] Date of Birth: Jan 09, 1951  Transition of Care [facility]) CM/SW Contact:    Adelene Amas, LCSW Phone Number: [phone] 01/07/2022, 10:22 AM  Clinical Narrative:                  Patient will discharge home with home health PT.  CSW confirmed patient is active with [facility].  Patient has rolling walker and cane at home, does not need bedside commode, but requested 3 in 1.  CSW contacted [employer] and they will deliver 3 in 1 to patient's room by 12PM.  CSW updated Unit RN and patient on DME delivery.  Expected Discharge Plan: [place] Barriers to Discharge: No Barriers Identified   Patient Goals and CMS Choice        Expected Discharge Plan and Services Expected Discharge Plan: [place] In-house Referral: Clinical Social Work   Post Acute Care Choice: Home Health (Pt active with [place] [facility]) Living arrangements for the past 2 months: Single Family Home Expected Discharge Date: 01/07/22               DME Arranged: 3-N-1 DME Agency: [employer] Date DME Agency Contacted: 01/07/22 Time DME Agency Contacted: 101 Representative spoke with at DME Agency: [place]: PT [place]: [place] Date [place]: 01/07/22 Time [place]: 93 Representative spoke with at [place]: [place]  Prior Living Arrangements/Services Living arrangements for the past 2 months: [place] with:: Spouse Patient language and need for interpreter reviewed:: Yes Do you feel safe going back to the place where you live?: Yes      Need for Family Participation in Patient Care: No (Comment) Care giver support system in place?: Yes (comment) Current home services: DME, Home PT Criminal Activity/Legal Involvement Pertinent to Current Situation/Hospitalization: No - Comment as  needed  Activities of Daily Living Home Assistive Devices/Equipment: Environmental consultant (specify type), Cane (specify quad or straight), Bedside commode/3-in-1 ADL Screening (condition at time of admission) Patient's cognitive ability adequate to safely complete daily activities?: Yes Is the patient deaf or have difficulty hearing?: No Does the patient have difficulty seeing, even when wearing glasses/contacts?: No Does the patient have difficulty concentrating, remembering, or making decisions?: No Patient able to express need for assistance with ADLs?: Yes Does the patient have difficulty dressing or bathing?: Yes Independently performs ADLs?: Yes (appropriate for developmental age) Does the patient have difficulty walking or climbing stairs?: Yes Weakness of Legs: Left Weakness of Arms/Hands: None  Permission Sought/Granted Permission sought to share information with : Facility Sport and exercise psychologist Permission granted to share information with : Yes, Verbal Permission Granted  Share Information with NAME: Jackqulyn, Mendel (Spouse)   [phone] (Mobile)           Emotional Assessment Appearance:: Appears stated age Attitude/Demeanor/Rapport: Engaged Affect (typically observed): Stable Orientation: : Oriented to Self, Oriented to Place, Oriented to  Time, Oriented to Situation Alcohol / Substance Use: Not Applicable Psych Involvement: No (comment)  Admission diagnosis:  Status post revision of total replacement of left knee [Z96.652] Patient Active Problem List   Diagnosis Date Noted   Status post revision of total replacement of left knee 01/05/2022   Strain of right hip 07/07/2019   Gastritis 11/26/2018   Hiatal hernia 11/26/2018   Flushing 09/04/2018   Palpitations 03/19/2017   History of basal cell  carcinoma 11/20/2016   Primary osteoarthritis of left knee 03/09/2016   History of total knee replacement, left 03/09/2016   Osteopenia 11/17/2015   Migraine without aura and  without status migrainosus, not intractable 11/17/2015   Urge urinary incontinence 11/17/2015   Dyslipidemia 11/17/2015   Large breasts 11/17/2015   Menopause 11/17/2015   Vitamin D deficiency 11/17/2015   MVP (mitral valve prolapse) 11/17/2015   History of lumbar fusion 11/17/2015   Chronic constipation 11/17/2015   Venous insufficiency 11/17/2015   GERD (gastroesophageal reflux disease) 11/17/2015   Eustachian tube dysfunction 11/17/2015   Sciatica of left side    PCP:  Steele Sizer, MD Pharmacy:   [facility] [address], [place] - [place] [address] Phone: [phone] Fax: [place], [place] [address] [address] Phone: [phone] Fax: [place], [place] [address] Phone: [phone] Fax: [phone]     Social Determinants of Health (SDOH) Interventions    Readmission Risk Interventions No flowsheet data found.

## 2022-01-07 NOTE — Progress Notes (Signed)
Physical Therapy Treatment Patient Details Name: Bonnie Owens MRN: [id number] DOB: 1951/10/07 Today's Date: 01/07/2022   History of Present Illness Bonnie Owens is a 71 y.o. female here today for history and physical for left total knee revision of femoral component and tibial insert, date of surgery 01/05/2022.    PT Comments    Pt was long sitting in bed with supportive spouse at bedside. She is A and O x 4 and agreeable to PT session. Does endorse increased pain this date versus previous date however was able to tolerate all OOB activity during session. She was able to exit L side of bed, stand to RW, then ambulate 200 ft with supervision. Safely demonstrated ability to ascend/descend step with CGA for safety. Both pt/family feel confident and safe to DC home with HHPT to follow. Will continue to benefit from skilled PT at DC to address deficits while maximizing independence with ADLs.    Recommendations for follow up therapy are one component of a multi-disciplinary discharge planning process, led by the attending physician.  Recommendations may be updated based on patient status, additional functional criteria and insurance authorization.  Follow Up Recommendations  Home health PT     Assistance Recommended at Discharge Intermittent Supervision/Assistance  Patient can return home with the following A little help with walking and/or transfers;A little help with bathing/dressing/bathroom;Assistance with cooking/housework;Assist for transportation;Help with stairs or ramp for entrance   Equipment Recommendations  BSC/3in1       Precautions / Restrictions Precautions Precautions: Knee;Fall Precaution Comments: wound vac, polar car, towel roll in place Restrictions Weight Bearing Restrictions: Yes LLE Weight Bearing: Weight bearing as tolerated     Mobility  Bed Mobility Overal bed mobility: Needs Assistance Bed Mobility: Supine to Sit, Sit to Supine     Supine to sit:  Min assist Sit to supine: Min assist        Transfers Overall transfer level: Needs assistance Equipment used: Rolling walker (2 wheels) Transfers: Sit to/from Stand Sit to Stand: Supervision     Ambulation/Gait Ambulation/Gait assistance: Supervision Gait Distance (Feet): 200 Feet Assistive device: Rolling walker (2 wheels) Gait Pattern/deviations: Step-through pattern, Decreased step length - right, Decreased stance time - left Gait velocity: decreased     General Gait Details: Pt was able to ambulate 200 ft without LOB or unsteadiness.   Stairs Stairs: Yes Stairs assistance: Min guard Stair Management: No rails, Step to pattern, With walker Number of Stairs: 1 General stair comments: Pt was able to safely demonstrate ability to ascend/descend 1 step with RW + CGA. Perform 2 x with improved safety on secound attempt.   Wheelchair Mobility    Modified Rankin (Stroke Patients Only)       Balance Overall balance assessment: Modified Independent          Cognition Arousal/Alertness: Awake/alert Behavior During Therapy: WFL for tasks assessed/performed Overall Cognitive Status: Within Functional Limits for tasks assessed      General Comments: Pt is A and O x 4           General Comments General comments (skin integrity, edema, etc.): reviewed car transfers, [facility] application, polar care, wound vac and all PT recommendations post admission. Pt will have assistance at DC      Pertinent Vitals/Pain Pain Assessment Pain Assessment: 0-10 Pain Score: 5  Faces Pain Scale: Hurts a little bit Pain Location: L knee Pain Descriptors / Indicators: Discomfort, Dull Pain Intervention(s): Limited activity within patient's tolerance, Monitored during session, Premedicated before session,  Repositioned, Ice applied     PT Goals (current goals can now be found in the care plan section) Acute Rehab PT Goals Patient Stated Goal: to go home Progress towards PT goals:  Progressing toward goals    Frequency    BID      PT Plan Current plan remains appropriate       AM-PAC PT "6 Clicks" Mobility   Outcome Measure  Help needed turning from your back to your side while in a flat bed without using bedrails?: None Help needed moving from lying on your back to sitting on the side of a flat bed without using bedrails?: A Little Help needed moving to and from a bed to a chair (including a wheelchair)?: A Little Help needed standing up from a chair using your arms (e.g., wheelchair or bedside chair)?: A Little Help needed to walk in hospital room?: A Little Help needed climbing 3-5 steps with a railing? : A Little 6 Click Score: 19    End of Session Equipment Utilized During Treatment: Gait belt Activity Tolerance: Patient tolerated treatment well Patient left: in bed;with call bell/phone within reach Nurse Communication: Mobility status PT Visit Diagnosis: Unsteadiness on feet (R26.81);Other abnormalities of gait and mobility (R26.89);Muscle weakness (generalized) (M62.81);Difficulty in walking, not elsewhere classified (R26.2)     Time: 2060-1561 PT Time Calculation (min) (ACUTE ONLY): 30 min  Charges:  $Gait Training: 8-22 mins $Therapeutic Activity: 8-22 mins                     Julaine Fusi PTA 01/07/22, 10:30 AM

## 2022-01-07 NOTE — Plan of Care (Signed)
Late entry for PT goals. All goals are current and appropriate at this time.  Bonnie Owens, PT, DPT 01/07/22, 7:41 AM   Problem: Acute Rehab PT Goals(only PT should resolve) Goal: Pt Will Go Supine/Side To Sit Flowsheets (Taken 01/07/2022 0740) Pt will go Supine/Side to Sit: Independently Goal: Pt Will Go Sit To Supine/Side Flowsheets (Taken 01/07/2022 0740) Pt will go Sit to Supine/Side: Independently Goal: Pt Will Transfer Bed To Chair/Chair To Bed Flowsheets (Taken 01/07/2022 0740) Pt will Transfer Bed to Chair/Chair to Bed: with modified independence Note: With LRAD Goal: Pt Will Ambulate Flowsheets (Taken 01/07/2022 0740) Pt will Ambulate:  > 125 feet  with modified independence  with least restrictive assistive device Goal: Pt Will Go Up/Down Stairs Flowsheets (Taken 01/07/2022 0740) Pt will Go Up / Down Stairs:  1-2 stairs  with least restrictive assistive device  with minimal assist

## 2022-01-11 ENCOUNTER — Encounter: Payer: Self-pay | Admitting: Orthopedic Surgery

## 2022-01-26 ENCOUNTER — Ambulatory Visit: Payer: Medicare HMO

## 2022-02-02 ENCOUNTER — Ambulatory Visit (INDEPENDENT_AMBULATORY_CARE_PROVIDER_SITE_OTHER): Payer: Medicare HMO

## 2022-02-02 DIAGNOSIS — Z0000 Encounter for general adult medical examination without abnormal findings: Secondary | ICD-10-CM

## 2022-02-02 NOTE — Patient Instructions (Signed)
Bonnie Owens , Thank you for taking time to come for your Medicare Wellness Visit. I appreciate your ongoing commitment to your health goals. Please review the following plan we discussed and let me know if I can assist you in the future.   Screening recommendations/referrals: Colonoscopy: done 08/05/20. Repeat 08/2030 Mammogram: done 08/01/21 Bone Density: done 08/05/20 Recommended yearly ophthalmology/optometry visit for glaucoma screening and checkup Recommended yearly dental visit for hygiene and checkup  Vaccinations: Influenza vaccine: done at [place] [facility]; need date Pneumococcal vaccine: done 11/20/16 Tdap vaccine: due Shingles vaccine: done 09/14/17 & 12/13/17   Covid-19:done 01/15/20, 02/05/20 & 08/17/20  Conditions/risks identified: Recommend increasing activity as tolerated  Next appointment: Follow up in one year for your annual wellness visit    Preventive Care 36 Years and Older, Female Preventive care refers to lifestyle choices and visits with your health care provider that can promote health and wellness. What does preventive care include? A yearly physical exam. This is also called an annual well check. Dental exams once or twice a year. Routine eye exams. Ask your health care provider how often you should have your eyes checked. Personal lifestyle choices, including: Daily care of your teeth and gums. Regular physical activity. Eating a healthy diet. Avoiding tobacco and drug use. Limiting alcohol use. Practicing safe sex. Taking low-dose aspirin every day. Taking vitamin and mineral supplements as recommended by your health care provider. What happens during an annual well check? The services and screenings done by your health care provider during your annual well check will depend on your age, overall health, lifestyle risk factors, and family history of disease. Counseling  Your health care provider may ask you questions about your: Alcohol use. Tobacco  use. Drug use. Emotional well-being. Home and relationship well-being. Sexual activity. Eating habits. History of falls. Memory and ability to understand (cognition). Work and work Statistician. Reproductive health. Screening  You may have the following tests or measurements: Height, weight, and BMI. Blood pressure. Lipid and cholesterol levels. These may be checked every 5 years, or more frequently if you are over 76 years old. Skin check. Lung cancer screening. You may have this screening every year starting at age 17 if you have a 30-pack-year history of smoking and currently smoke or have quit within the past 15 years. Fecal occult blood test (FOBT) of the stool. You may have this test every year starting at age 42. Flexible sigmoidoscopy or colonoscopy. You may have a sigmoidoscopy every 5 years or a colonoscopy every 10 years starting at age 15. Hepatitis C blood test. Hepatitis B blood test. Sexually transmitted disease (STD) testing. Diabetes screening. This is done by checking your blood sugar (glucose) after you have not eaten for a while (fasting). You may have this done every 1-3 years. Bone density scan. This is done to screen for osteoporosis. You may have this done starting at age 29. Mammogram. This may be done every 1-2 years. Talk to your health care provider about how often you should have regular mammograms. Talk with your health care provider about your test results, treatment options, and if necessary, the need for more tests. Vaccines  Your health care provider may recommend certain vaccines, such as: Influenza vaccine. This is recommended every year. Tetanus, diphtheria, and acellular pertussis (Tdap, Td) vaccine. You may need a Td booster every 10 years. Zoster vaccine. You may need this after age 59. Pneumococcal 13-valent conjugate (PCV13) vaccine. One dose is recommended after age 54. Pneumococcal polysaccharide (PPSV23) vaccine.  One dose is recommended  after age 49. Talk to your health care provider about which screenings and vaccines you need and how often you need them. This information is not intended to replace advice given to you by your health care provider. Make sure you discuss any questions you have with your health care provider. Document Released: 12/17/2015 Document Revised: 08/09/2016 Document Reviewed: 09/21/2015 Elsevier Interactive Patient Education  2017 [place] Prevention in the Home Falls can cause injuries. They can happen to people of all ages. There are many things you can do to make your home safe and to help prevent falls. What can I do on the outside of my home? Regularly fix the edges of walkways and driveways and fix any cracks. Remove anything that might make you trip as you walk through a door, such as a raised step or threshold. Trim any bushes or trees on the path to your home. Use bright outdoor lighting. Clear any walking paths of anything that might make someone trip, such as rocks or tools. Regularly check to see if handrails are loose or broken. Make sure that both sides of any steps have handrails. Any raised decks and porches should have guardrails on the edges. Have any leaves, snow, or ice cleared regularly. Use sand or salt on walking paths during winter. Clean up any spills in your garage right away. This includes oil or grease spills. What can I do in the bathroom? Use night lights. Install grab bars by the toilet and in the tub and shower. Do not use towel bars as grab bars. Use non-skid mats or decals in the tub or shower. If you need to sit down in the shower, use a plastic, non-slip stool. Keep the floor dry. Clean up any water that spills on the floor as soon as it happens. Remove soap buildup in the tub or shower regularly. Attach bath mats securely with double-sided non-slip rug tape. Do not have throw rugs and other things on the floor that can make you trip. What can I do  in the bedroom? Use night lights. Make sure that you have a light by your bed that is easy to reach. Do not use any sheets or blankets that are too big for your bed. They should not hang down onto the floor. Have a firm chair that has side arms. You can use this for support while you get dressed. Do not have throw rugs and other things on the floor that can make you trip. What can I do in the kitchen? Clean up any spills right away. Avoid walking on wet floors. Keep items that you use a lot in easy-to-reach places. If you need to reach something above you, use a strong step stool that has a grab bar. Keep electrical cords out of the way. Do not use floor polish or wax that makes floors slippery. If you must use wax, use non-skid floor wax. Do not have throw rugs and other things on the floor that can make you trip. What can I do with my stairs? Do not leave any items on the stairs. Make sure that there are handrails on both sides of the stairs and use them. Fix handrails that are broken or loose. Make sure that handrails are as long as the stairways. Check any carpeting to make sure that it is firmly attached to the stairs. Fix any carpet that is loose or worn. Avoid having throw rugs at the top or bottom of  the stairs. If you do have throw rugs, attach them to the floor with carpet tape. Make sure that you have a light switch at the top of the stairs and the bottom of the stairs. If you do not have them, ask someone to add them for you. What else can I do to help prevent falls? Wear shoes that: Do not have high heels. Have rubber bottoms. Are comfortable and fit you well. Are closed at the toe. Do not wear sandals. If you use a stepladder: Make sure that it is fully opened. Do not climb a closed stepladder. Make sure that both sides of the stepladder are locked into place. Ask someone to hold it for you, if possible. Clearly mark and make sure that you can see: Any grab bars or  handrails. First and last steps. Where the edge of each step is. Use tools that help you move around (mobility aids) if they are needed. These include: Canes. Walkers. Scooters. Crutches. Turn on the lights when you go into a dark area. Replace any light bulbs as soon as they burn out. Set up your furniture so you have a clear path. Avoid moving your furniture around. If any of your floors are uneven, fix them. If there are any pets around you, be aware of where they are. Review your medicines with your doctor. Some medicines can make you feel dizzy. This can increase your chance of falling. Ask your doctor what other things that you can do to help prevent falls. This information is not intended to replace advice given to you by your health care provider. Make sure you discuss any questions you have with your health care provider. Document Released: 09/16/2009 Document Revised: 04/27/2016 Document Reviewed: 12/25/2014 Elsevier Interactive Patient Education  2017 [employer].

## 2022-02-02 NOTE — Progress Notes (Signed)
Subjective:   Bonnie Owens is a 71 y.o. female who presents for Medicare Annual (Subsequent) preventive examination.  Virtual Visit via Telephone Note  I connected with  Bonnie Owens on 02/02/22 at  2:50 PM EST by telephone and verified that I am speaking with the correct person using two identifiers.  Location: Patient: home Provider: [facility] Persons participating in the virtual visit: patient/Nurse Health Advisor   I discussed the limitations, risks, security and privacy concerns of performing an evaluation and management service by telephone and the availability of in person appointments. The patient expressed understanding and agreed to proceed.  Interactive audio and video telecommunications were attempted between this nurse and patient, however failed, due to patient having technical difficulties OR patient did not have access to video capability.  We continued and completed visit with audio only.  Some vital signs may be absent or patient reported.   Bonnie Littler, LPN   Review of Systems     Cardiac Risk Factors include: advanced age (>82men, >63 women);hypertension     Objective:    Today's Vitals   02/02/22 1457  PainSc: 4    There is no height or weight on file to calculate BMI.  Advanced Directives 02/02/2022 01/05/2022 12/27/2021 01/25/2021 12/02/2019 09/01/2019 11/29/2018  Does Patient Have a Medical Advance Directive? Yes Yes Yes Yes Yes Yes Yes  Type of Estate agent of [place];Living will Healthcare Power of [place];Living will Healthcare Power of [place];Living will Healthcare Power of [place];Living will Healthcare Power of [place];Living will - Healthcare Power of [place];Living will  Does patient want to make changes to medical advance directive? - No - Patient declined - - - - -  Copy of Healthcare Power of Attorney in Chart? Yes - validated most recent copy scanned in chart (See row information) - Yes - validated most recent  copy scanned in chart (See row information) Yes - validated most recent copy scanned in chart (See row information) Yes - validated most recent copy scanned in chart (See row information) - Yes - validated most recent copy scanned in chart (See row information)  Would patient like information on creating a medical advance directive? - - - - - - -    Current Medications (verified) Outpatient Encounter Medications as of 02/02/2022  Medication Sig   Ascorbic Acid (VITAMIN C PO) Take 1,000 mg by mouth daily.   calcium carbonate (TUMS - DOSED IN MG ELEMENTAL CALCIUM) 500 MG chewable tablet Chew 1 tablet by mouth daily as needed for indigestion or heartburn.   Cholecalciferol (VITAMIN D-3) 125 MCG (5000 UT) TABS Take 5,000 Units by mouth daily.   diltiazem (CARDIZEM CD) 120 MG 24 hr capsule Take 1 capsule (120 mg total) by mouth daily.   docusate sodium (COLACE) 100 MG capsule Take 1 capsule (100 mg total) by mouth 2 (two) times daily.   fluticasone (FLONASE) 50 MCG/ACT nasal spray Place 2 sprays into both nostrils daily. (Patient taking differently: Place 2 sprays into both nostrils daily as needed.)   ibandronate (BONIVA) 150 MG tablet TAKE ONE TABLET BY MOUTH EVERY 30 DAYS   latanoprost (XALATAN) 0.005 % ophthalmic solution Place 1 drop into both eyes at bedtime.   Misc Natural Products (OSTEO BI-FLEX JOINT SHIELD PO) Take 1 tablet by mouth daily.   ondansetron (ZOFRAN) 4 MG tablet Take 1 tablet (4 mg total) by mouth every 6 (six) hours as needed for nausea.   Polyethylene Glycol 3350 (PEG 3350) 17 GM/SCOOP POWD Take 17  g by mouth daily. (Patient taking differently: Take 17 g by mouth daily as needed (constipation).)   SUMAtriptan (IMITREX) 100 MG tablet Take 1 tablet (100 mg total) by mouth as needed.   traMADol (ULTRAM) 50 MG tablet Take 1 tablet (50 mg total) by mouth every 6 (six) hours as needed.   vitamin B-12 (CYANOCOBALAMIN) 1000 MCG tablet Take 1,000 mcg by mouth daily.   [DISCONTINUED]  enoxaparin (LOVENOX) 40 MG/0.4ML injection Inject 0.4 mLs (40 mg total) into the skin daily for 14 days.   [DISCONTINUED] HYDROcodone-acetaminophen (NORCO/VICODIN) 5-325 MG tablet Take 1-2 tablets by mouth every 4 (four) hours as needed for moderate pain (pain score 4-6).   [DISCONTINUED] methocarbamol (ROBAXIN) 500 MG tablet Take 1 tablet (500 mg total) by mouth every 6 (six) hours as needed for muscle spasms.   No facility-administered encounter medications on file as of 02/02/2022.    Allergies (verified) Patient has no known allergies.   History: Past Medical History:  Diagnosis Date   Anemia    Anginal pain (HCC)    Chronic constipation    Complication of anesthesia    Bad headache when waking   Depression    Dyspnea    GERD (gastroesophageal reflux disease)    Glaucoma    Headache    occasional migraine   Hyperlipidemia    Left knee pain    Mitral valve prolapse    Osteopenia    Urine incontinence    Venous (peripheral) insufficiency    Vitamin D deficiency    Past Surgical History:  Procedure Laterality Date   ABDOMINAL HYSTERECTOMY  1985   APPENDECTOMY  1971   BACK SURGERY     BASAL CELL CARCINOMA EXCISION Left 10/2016   Underneath Left Eye- [facility]- Dr. Gwen Owens   BLADDER SUSPENSION     COLONOSCOPY N/A 11/14/2017   Procedure: COLONOSCOPY;  Surgeon: Toledo, Bonnie Nearing, MD;  Location: [facility] ENDOSCOPY;  Service: Endoscopy;  Laterality: N/A;   COLONOSCOPY WITH PROPOFOL N/A 09/01/2019   Procedure: COLONOSCOPY WITH PROPOFOL;  Surgeon: Bonnie Deem, MD;  Location: [facility] ENDOSCOPY;  Service: Endoscopy;  Laterality: N/A;   ESOPHAGOGASTRODUODENOSCOPY (EGD) WITH PROPOFOL N/A 11/20/2018   Procedure: ESOPHAGOGASTRODUODENOSCOPY (EGD) WITH PROPOFOL;  Surgeon: Toledo, Bonnie Nearing, MD;  Location: [facility] ENDOSCOPY;  Service: Gastroenterology;  Laterality: N/A;   JOINT REPLACEMENT Left    (LT) TKR 2017   LUMBAR DISC SURGERY  1995   sling procedure     TOTAL KNEE  ARTHROPLASTY Left 03/09/2016   Procedure: TOTAL KNEE ARTHROPLASTY;  Surgeon: Bonnie Bucker, MD;  Location: [facility] ORS;  Service: Orthopedics;  Laterality: Left;   TOTAL KNEE REVISION Left 01/05/2022   Procedure: Left knee revision, femoral component and tibial insert;  Surgeon: Bonnie Bucker, MD;  Location: [facility] ORS;  Service: Orthopedics;  Laterality: Left;   TUBAL LIGATION  1980   tummy tuck     Family History  Problem Relation Age of Onset   Breast cancer Sister 34       twice, both breasts   Graves' disease Sister    Goiter Sister    Dementia Mother    Cancer Father        liver   Thyroid disease Brother    Prostate cancer Brother    Breast cancer Maternal Grandmother        ? age   Dysfunctional uterine bleeding Daughter    Dementia Maternal Grandfather    Cancer Paternal Grandmother    Social History   Socioeconomic History  Marital status: Married    Spouse name: Aurther Loft    Number of children: 2   Years of education: Not on file   Highest education level: 12th grade  Occupational History   Occupation: Engineer, materials. 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    Social Determinants of Health   Financial Resource Strain: Low Risk    Difficulty of Paying Living Expenses: Not hard at all  Food Insecurity: No Food Insecurity   Worried About Programme researcher, broadcasting/film/video in the Last Year: Never true   Ran Out of Food in the Last Year: Never true  Transportation Needs: No Transportation Needs   Lack of Transportation (Medical): No   Lack of Transportation (Non-Medical): No  Physical Activity: Inactive   Days of  Exercise per Week: 0 days   Minutes of Exercise per Session: 0 min  Stress: No Stress Concern Present   Feeling of Stress : Not at all  Social Connections: Socially Integrated   Frequency of Communication with Friends and Family: More than three times a week   Frequency of Social Gatherings with Friends and Family: More than three times a week   Attends Religious Services: More than 4 times per year   Active Member of [employer] or Organizations: Yes   Attends Engineer, structural: More than 4 times per year   Marital Status: Married    Tobacco Counseling Counseling given: Not Answered   Clinical Intake:  Pre-visit preparation completed: Yes  Pain : 0-10 Pain Score: 4  Pain Type: Chronic pain Pain Location: Knee Pain Orientation: Left Pain Descriptors / Indicators: Aching, Sore Pain Onset: More than a month ago Pain Frequency: Constant     Nutritional Risks: None Diabetes: No  How often do you need to have someone help you when you read instructions, pamphlets, or other written materials from your doctor or pharmacy?: 1 - Never    Interpreter Needed?: No  Information entered by :: Bonnie Littler LPN   Activities of Daily Living In your present state of health, do you have any difficulty performing the following activities: 02/02/2022 01/05/2022  Hearing? N -  Vision? N -  Difficulty concentrating or making decisions? N -  Walking or climbing stairs? N -  Dressing or bathing? N -  Doing errands, shopping? N N  Preparing Food and eating ? N -  Using the Toilet? N -  In the past six months, have you accidently leaked urine? N -  Do you have problems with loss of bowel control? N -  Managing your Medications? N -  Managing your Finances? N -  Housekeeping or managing your Housekeeping? N -  Some recent data might be hidden    Patient Care Team: Alba Cory, MD as PCP - General (Family Medicine) Lady Gary Darlin Priestly, MD as Consulting Physician  (Cardiology) Stanton Kidney, MD as Consulting Physician (Gastroenterology) Bonnie Bucker, MD as Consulting Physician (Orthopedic Surgery)  Indicate any recent Medical Services you may have received from other than [facility] providers  in the past year (date may be approximate).     Assessment:   This is a routine wellness examination for [place].  Hearing/Vision screen Hearing Screening - Comments:: Pt states mild hearing difficulty in left ear does not require hearing aids; sees Dr. Jenne Campus Vision Screening - Comments:: [facility] screen[facility]  Dietary issues and exercise activities discussed: Current Exercise Habits: The patient does not participate in regular exercise at present, Exercise limited by: orthopedic condition(s)   Goals Addressed   None    Depression Screen PHQ 2/9 Scores 02/02/2022 06/21/2021 01/25/2021 01/21/2021 07/21/2020 12/18/2019 12/02/2019  PHQ - 2 Score 0 0 0 0 0 0 0  PHQ- 9 Score - - - - 0 0 -    Fall Risk Fall Risk  02/02/2022 06/21/2021 01/25/2021 01/21/2021 07/21/2020  Falls in the past year? 0 0 0 0 0  Number falls in past yr: 0 0 0 0 -  Injury with Fall? 0 0 0 0 -  Risk for fall due to : Orthopedic patient - No Fall Risks - -  Follow up Falls prevention discussed - Falls prevention discussed - -    FALL RISK PREVENTION PERTAINING TO THE HOME:  Any stairs in or around the home? Yes  If so, are there any without handrails? No  Home free of loose throw rugs in walkways, pet beds, electrical cords, etc? Yes  Adequate lighting in your home to reduce risk of falls? Yes   ASSISTIVE DEVICES UTILIZED TO PREVENT FALLS:  Life alert? No  Use of a cane, walker or w/c? Yes  Grab bars in the bathroom? No  Shower chair or bench in shower? No  Elevated toilet seat or a handicapped toilet? No   TIMED UP AND GO:  Was the test performed? No . Telephonic visit.   Cognitive Function: Normal cognitive status assessed by direct observation by this  Nurse Health Advisor. No abnormalities found.      6CIT Screen 11/29/2018  What Year? 0 points  What month? 0 points  What time? 0 points  Count back from 20 0 points  Months in reverse 2 points  Repeat phrase 2 points  Total Score 4    Immunizations Immunization History  Administered Date(s) Administered   Fluad Quad(high Dose 65+) 08/18/2019, 01/21/2021   Influenza Split 10/03/2012   Influenza, High Dose Seasonal PF 08/16/2018   Influenza, Seasonal, Injecte, Preservative Fre 10/02/2011   Influenza,inj,Quad PF,6+ Mos 08/24/2014, 09/16/2015, 09/29/2016   Influenza-Unspecified 08/24/2014, 09/14/2017   PFIZER(Purple Top)SARS-COV-2 Vaccination 01/15/2020, 02/05/2020, 08/17/2020   Pneumococcal Conjugate-13 11/20/2016   Pneumococcal Polysaccharide-23 04/14/2015   Tdap 05/16/2010   Zoster Recombinat (Shingrix) 09/14/2017, 12/13/2017   Zoster, Live 10/02/2011    TDAP status: Due, Education has been provided regarding the importance of this vaccine. Advised may receive this vaccine at local pharmacy or Health Dept. Aware to provide a copy of the vaccination record if obtained from local pharmacy or Health Dept. Verbalized acceptance and understanding.  Flu Vaccine status: Up to date  Pneumococcal vaccine status: Up to date  Covid-19 vaccine status: Completed vaccines  Qualifies for Shingles Vaccine? Yes   Zostavax completed Yes   Shingrix Completed?: Yes  Screening Tests Health Maintenance  Topic Date Due   TETANUS/TDAP  05/16/2020   COVID-19 Vaccine (4 - Booster for Pfizer series) 10/12/2020   INFLUENZA VACCINE  07/04/2021   MAMMOGRAM  08/02/2023   COLONOSCOPY (Pts 45-40yrs Insurance coverage will need to be confirmed)  08/31/2029  Pneumonia Vaccine 40+ Years old  Completed   DEXA SCAN  Completed   Hepatitis C Screening  Completed   Zoster Vaccines- Shingrix  Completed   HPV VACCINES  Aged Out    Health Maintenance  Health Maintenance Due  Topic Date Due    TETANUS/TDAP  05/16/2020   COVID-19 Vaccine (4 - Booster for Pfizer series) 10/12/2020   INFLUENZA VACCINE  07/04/2021    Colorectal cancer screening: Type of screening: Colonoscopy. Completed 08/05/20. Repeat every 10 years  Mammogram status: Completed 08/01/21. Repeat every year  Bone Density status: Completed 08/05/20. Results reflect: Bone density results: OSTEOPENIA. Repeat every 2 years.  Lung Cancer Screening: (Low Dose CT Chest recommended if Age 22-80 years, 30 pack-year currently smoking OR have quit w/in 15years.) does not qualify.  Additional Screening:  Hepatitis C Screening: does qualify; Completed 11/17/15  Vision Screening: Recommended annual ophthalmology exams for early detection of glaucoma and other disorders of the eye. Is the patient up to date with their annual eye exam?  Yes  Who is the provider or what is the name of the office in which the patient attends annual eye exams? [facility].   Dental Screening: Recommended annual dental exams for proper oral hygiene  Community Resource Referral / Chronic Care Management: CRR required this visit?  No   CCM required this visit?  No      Plan:     I have personally reviewed and noted the following in the patient's chart:   Medical and social history Use of alcohol, tobacco or illicit drugs  Current medications and supplements including opioid prescriptions.  Functional ability and status Nutritional status Physical activity Advanced directives List of other physicians Hospitalizations, surgeries, and ER visits in previous 12 months Vitals Screenings to include cognitive, depression, and falls Referrals and appointments  In addition, I have reviewed and discussed with patient certain preventive protocols, quality metrics, and best practice recommendations. A written personalized care plan for preventive services as well as general preventive health recommendations were provided to patient.     Bonnie Littler, LPN   01/04/3085   Nurse Notes: none

## 2022-02-08 ENCOUNTER — Other Ambulatory Visit: Payer: Self-pay | Admitting: Family Medicine

## 2022-02-08 DIAGNOSIS — K5909 Other constipation: Secondary | ICD-10-CM

## 2022-05-17 ENCOUNTER — Other Ambulatory Visit: Payer: Self-pay | Admitting: Family Medicine

## 2022-05-17 DIAGNOSIS — G43009 Migraine without aura, not intractable, without status migrainosus: Secondary | ICD-10-CM

## 2022-06-09 ENCOUNTER — Other Ambulatory Visit: Payer: Self-pay | Admitting: Family Medicine

## 2022-06-09 DIAGNOSIS — K5909 Other constipation: Secondary | ICD-10-CM

## 2022-06-22 ENCOUNTER — Encounter: Payer: Medicare HMO | Admitting: Family Medicine

## 2022-07-26 ENCOUNTER — Other Ambulatory Visit: Payer: Self-pay | Admitting: Family Medicine

## 2022-07-26 DIAGNOSIS — Z1231 Encounter for screening mammogram for malignant neoplasm of breast: Secondary | ICD-10-CM

## 2022-07-27 ENCOUNTER — Telehealth: Payer: Self-pay | Admitting: Family Medicine

## 2022-07-27 DIAGNOSIS — K5909 Other constipation: Secondary | ICD-10-CM

## 2022-07-31 NOTE — Telephone Encounter (Signed)
Pt is scheduled °

## 2022-07-31 NOTE — Telephone Encounter (Signed)
Lvm to schedule appt before can do a refill

## 2022-08-02 NOTE — Progress Notes (Unsigned)
Name: Bonnie Owens   MRN: [id number]    DOB: 02/23/1951   Date:08/03/2022       Progress Note  Subjective  Chief Complaint  Follow Up  HPI  Hot flashes:stable, not on medication, states zoloft was given by cardiologist for anxiety and did not affect frequency or intensity of symptoms    Enterocele : she has noticed right lower quadrant fullness for months, it is intermittent not associated with change in bowel movements of bladder problems. She has a history of hysterectomy at age 25 and states right ovary removed - complicated surgery, but has left ovary, she also had bladder tech, she has seen  Dr. Carlena Hurl and GI : Dr. Gustavo Lah , he recommended exploratory surgery, she saw Dr. Fleet Contras but he recommended watchful waiting. She states the pain improved when she saw ortho and had a steroid injection, the right lower quadrant pain was secondary to nerve pain instead of gastrointestinal problem - seen by Dr. Roland Rack had a steroid injection on SI joint and pain has resolved.    Osteopenia: with low Vitamin D but last level was at goal, continue Boniva, vitamin D supplementation   History of esophageal dilation: 11/2018, following a GERD appropriate diet  No dysphagia or choking She eats earlier in the evenings. She continues to take Tums prn, previous history of gastritis but no problems at this time    Dyslipidemia: discussed results, she will continue life style modifications for now   The 10-year ASCVD risk score (Arnett DK, et al., 2019) is: 10.8%   Values used to calculate the score:     Age: 71 years     Sex: Female     Is Non-Hispanic African American: No     Diabetic: No     Tobacco smoker: No     Systolic Blood Pressure: 665 mmHg     Is BP treated: No     HDL Cholesterol: 62 mg/dL     Total Cholesterol: 220 mg/dL    Migraine headaches: she states episodes are stable about 2 times a month, usually worse in the Fall .  Usually right frontal area, described as throbbing sometimes  sharp , denies phonophobia or photophobia. Imitrex helps alleviate symptoms , symptoms resolves within a couple of hours. She denies side effects of medication. She needs refills of Imitrex today    History Left knee partial replacement back in 2017: she went back to Dr. Rudene Christians because of increase in instability and popping sensation and was diagnosed with hard wear malfunction she had a revision done 01/2022 and is doing better, still has some pain if she is more active during the day and effusion but gradually improving.   MVP: taking cardizem, she states medication works well, seeing cardiologist - Dr. Ubaldo Glassing yearly and has a follow up next week. She still has palpitation but usually triggered by fatigue , she states she was feeling very anxious after her knee surgery and caused increase of palpitation so Dr. Ubaldo Glassing gave her low dose of zoloft and seems to have improved symptoms.   Patient Active Problem List   Diagnosis Date Noted   Status post revision of total replacement of left knee 01/05/2022   Strain of right hip 07/07/2019   Gastritis 11/26/2018   Hiatal hernia 11/26/2018   [place] 09/04/2018   Palpitations 03/19/2017   History of basal cell carcinoma 11/20/2016   Primary osteoarthritis of left knee 03/09/2016   History of total knee replacement, left 03/09/2016   Osteopenia  11/17/2015   Migraine without aura and without status migrainosus, not intractable 11/17/2015   Urge urinary incontinence 11/17/2015   Dyslipidemia 11/17/2015   Large breasts 11/17/2015   Menopause 11/17/2015   Vitamin D deficiency 11/17/2015   MVP (mitral valve prolapse) 11/17/2015   History of lumbar fusion 11/17/2015   Chronic constipation 11/17/2015   Venous insufficiency 11/17/2015   GERD (gastroesophageal reflux disease) 11/17/2015   Eustachian tube dysfunction 11/17/2015   Sciatica of left side     Past Surgical History:  Procedure Laterality Date   [place]     BASAL CELL CARCINOMA EXCISION Left 10/2016   Underneath Left Eye- [facility]- Dr. Nehemiah Massed   BLADDER SUSPENSION     COLONOSCOPY N/A 11/14/2017   Procedure: COLONOSCOPY;  Surgeon: Toledo, Benay Pike, MD;  Location: [facility] ENDOSCOPY;  Service: Endoscopy;  Laterality: N/A;   COLONOSCOPY WITH PROPOFOL N/A 09/01/2019   Procedure: COLONOSCOPY WITH PROPOFOL;  Surgeon: Lollie Sails, MD;  Location: [facility] ENDOSCOPY;  Service: Endoscopy;  Laterality: N/A;   ESOPHAGOGASTRODUODENOSCOPY (EGD) WITH PROPOFOL N/A 11/20/2018   Procedure: ESOPHAGOGASTRODUODENOSCOPY (EGD) WITH PROPOFOL;  Surgeon: Toledo, Benay Pike, MD;  Location: [facility] ENDOSCOPY;  Service: Gastroenterology;  Laterality: N/A;   JOINT REPLACEMENT Left    (LT) TKR 2017   LUMBAR DISC SURGERY  1995   sling procedure     TOTAL KNEE ARTHROPLASTY Left 03/09/2016   Procedure: TOTAL KNEE ARTHROPLASTY;  Surgeon: Hessie Knows, MD;  Location: [facility] ORS;  Service: Orthopedics;  Laterality: Left;   TOTAL KNEE REVISION Left 01/05/2022   Procedure: Left knee revision, femoral component and tibial insert;  Surgeon: Hessie Knows, MD;  Location: [facility] ORS;  Service: Orthopedics;  Laterality: Left;   TUBAL LIGATION  1980   tummy tuck      Family History  Problem Relation Age of Onset   Breast cancer Sister 13       twice, both breasts   Graves' disease Sister    Goiter Sister    Dementia Mother    Cancer Father        liver   Thyroid disease Brother    Prostate cancer Brother    Breast cancer Maternal Grandmother        ? age   Dysfunctional uterine bleeding Daughter    Dementia Maternal Grandfather    Cancer Paternal Grandmother     Social History   Tobacco Use   Smoking status: Never   Smokeless tobacco: Never  Substance Use Topics   Alcohol use: Not Currently     Current Outpatient Medications:    Ascorbic Acid (VITAMIN C PO), Take 1,000 mg by mouth daily., Disp: , Rfl:    Aspirin 81 MG CAPS, Take 81 mg by  mouth daily., Disp: , Rfl:    calcium carbonate (TUMS - DOSED IN MG ELEMENTAL CALCIUM) 500 MG chewable tablet, Chew 1 tablet by mouth daily as needed for indigestion or heartburn., Disp: , Rfl:    Cholecalciferol (VITAMIN D-3) 125 MCG (5000 UT) TABS, Take 5,000 Units by mouth daily., Disp: , Rfl:    diltiazem (CARDIZEM CD) 120 MG 24 hr capsule, Take 1 capsule (120 mg total) by mouth daily., Disp: 30 capsule, Rfl: 12   ibandronate (BONIVA) 150 MG tablet, TAKE ONE TABLET BY MOUTH EVERY 30 DAYS, Disp: 3 tablet, Rfl: 3   latanoprost (XALATAN) 0.005 % ophthalmic solution, Place 1 drop into both eyes at bedtime., Disp: , Rfl:  Misc Natural Products (OSTEO BI-FLEX JOINT SHIELD PO), Take 1 tablet by mouth daily., Disp: , Rfl:    polyethylene glycol powder (GLYCOLAX/MIRALAX) 17 GM/SCOOP powder, Take 17 g by mouth daily., Disp: 510 g, Rfl: 0   sertraline (ZOLOFT) 25 MG tablet, Take 25 mg by mouth daily., Disp: , Rfl:    SUMAtriptan (IMITREX) 100 MG tablet, Take 1 tablet (100 mg total) by mouth as needed., Disp: 9 tablet, Rfl: 0   vitamin B-12 (CYANOCOBALAMIN) 1000 MCG tablet, Take 1,000 mcg by mouth daily., Disp: , Rfl:   No Known Allergies  I personally reviewed active problem list, medication list, allergies, family history, social history, health maintenance with the patient/caregiver today.   ROS  Constitutional: Negative for fever, positive for  weight change.  Respiratory: Negative for cough and shortness of breath.   Cardiovascular: Negative for chest pain or palpitations.  Gastrointestinal: Negative for abdominal pain, no bowel changes.  Musculoskeletal: Negative for gait problem or joint swelling.  Skin: Negative for rash.  Neurological: Negative for dizziness, positive for intermittent  headache.  No other specific complaints in a complete review of systems (except as listed in HPI above).   Objective  Vitals:   08/03/22 1043  BP: 122/70  Pulse: 96  Resp: 16  SpO2: 98%  Weight:  142 lb (64.4 kg)  Height: '5\' 2"'$  (1.575 m)    Body mass index is 25.97 kg/m.  Physical Exam  Constitutional: Patient appears well-developed and well-nourished.  No distress.  HEENT: head atraumatic, normocephalic, pupils equal and reactive to light, neck supple Cardiovascular: Normal rate, regular rhythm and normal heart sounds.  No murmur heard. No BLE edema. Pulmonary/Chest: Effort normal and breath sounds normal. No respiratory distress. Abdominal: Soft.  There is no tenderness. Psychiatric: Patient has a normal mood and affect. behavior is normal. Judgment and thought content normal.    PHQ2/9:    08/03/2022   10:40 AM 02/02/2022    3:00 PM 06/21/2021    9:17 AM 01/25/2021    9:33 AM 01/21/2021    9:35 AM  Depression screen PHQ 2/9  Decreased Interest 0 0 0 0 0  Down, Depressed, Hopeless 0 0 0 0 0  PHQ - 2 Score 0 0 0 0 0  Altered sleeping 0      Tired, decreased energy 0      Change in appetite 0      Feeling bad or failure about yourself  0      Trouble concentrating 0      Moving slowly or fidgety/restless 0      Suicidal thoughts 0      PHQ-9 Score 0        phq 9 is negative   Fall Risk:    08/03/2022   10:40 AM 02/02/2022    3:02 PM 06/21/2021    9:16 AM 01/25/2021    9:35 AM 01/21/2021    9:35 AM  Fall Risk   Falls in the past year? 0 0 0 0 0  Number falls in past yr: 0 0 0 0 0  Injury with Fall? 0 0 0 0 0  Risk for fall due to : No Fall Risks Orthopedic patient  No Fall Risks   Follow up Falls prevention discussed Falls prevention discussed  Falls prevention discussed       Functional Status Survey: Is the patient deaf or have difficulty hearing?: No Does the patient have difficulty seeing, even when wearing glasses/contacts?: No Does the patient have  difficulty concentrating, remembering, or making decisions?: No Does the patient have difficulty walking or climbing stairs?: No Does the patient have difficulty dressing or bathing?: No Does the patient  have difficulty doing errands alone such as visiting a doctor's office or shopping?: No    Assessment & Plan  1. Dyslipidemia  - Lipid panel  2. Vitamin D deficiency  - VITAMIN D 25 Hydroxy (Vit-D Deficiency, Fractures)  3. Gastroesophageal reflux disease without esophagitis   4. Migraine without aura and without status migrainosus, not intractable  - SUMAtriptan (IMITREX) 100 MG tablet; Take 1 tablet (100 mg total) by mouth as needed.  Dispense: 27 tablet; Refill: 1  5. MVP (mitral valve prolapse)   6. Osteopenia after menopause  - ibandronate (BONIVA) 150 MG tablet; Take in the morning with a full glass of water, on an empty stomach, and do not take anything else by mouth or lie down for the next 30 min.  Dispense: 3 tablet; Refill: 3 - DG Bone Density; Future  7. Anemia, unspecified type  - CBC with Differential/Platelet  8. Long-term use of high-risk medication  - COMPLETE METABOLIC PANEL WITH GFR  9. Chronic constipation  - polyethylene glycol powder (GLYCOLAX/MIRALAX) 17 GM/SCOOP powder; Take 17 g by mouth daily.  Dispense: 850 g; Refill: 1

## 2022-08-03 ENCOUNTER — Encounter: Payer: Self-pay | Admitting: Family Medicine

## 2022-08-03 ENCOUNTER — Ambulatory Visit (INDEPENDENT_AMBULATORY_CARE_PROVIDER_SITE_OTHER): Payer: Medicare HMO | Admitting: Family Medicine

## 2022-08-03 VITALS — BP 122/70 | HR 96 | Resp 16 | Ht 62.0 in | Wt 142.0 lb

## 2022-08-03 DIAGNOSIS — I341 Nonrheumatic mitral (valve) prolapse: Secondary | ICD-10-CM

## 2022-08-03 DIAGNOSIS — E559 Vitamin D deficiency, unspecified: Secondary | ICD-10-CM

## 2022-08-03 DIAGNOSIS — Z79899 Other long term (current) drug therapy: Secondary | ICD-10-CM

## 2022-08-03 DIAGNOSIS — E785 Hyperlipidemia, unspecified: Secondary | ICD-10-CM | POA: Diagnosis not present

## 2022-08-03 DIAGNOSIS — G43009 Migraine without aura, not intractable, without status migrainosus: Secondary | ICD-10-CM

## 2022-08-03 DIAGNOSIS — K219 Gastro-esophageal reflux disease without esophagitis: Secondary | ICD-10-CM

## 2022-08-03 DIAGNOSIS — K5909 Other constipation: Secondary | ICD-10-CM

## 2022-08-03 DIAGNOSIS — Z780 Asymptomatic menopausal state: Secondary | ICD-10-CM

## 2022-08-03 DIAGNOSIS — D649 Anemia, unspecified: Secondary | ICD-10-CM

## 2022-08-03 DIAGNOSIS — M8580 Other specified disorders of bone density and structure, unspecified site: Secondary | ICD-10-CM

## 2022-08-03 MED ORDER — IBANDRONATE SODIUM 150 MG PO TABS: ORAL_TABLET | ORAL | 3 refills | Status: DC

## 2022-08-03 MED ORDER — SUMATRIPTAN SUCCINATE 100 MG PO TABS: 100.0000 mg | ORAL_TABLET | ORAL | 1 refills | Status: DC | PRN

## 2022-08-03 MED ORDER — POLYETHYLENE GLYCOL 3350 17 GM/SCOOP PO POWD: 17.0000 g | Freq: Every day | ORAL | 1 refills | Status: DC

## 2022-08-03 NOTE — Patient Instructions (Signed)
Tdap RSV High dose flu New COVID booster

## 2022-08-04 LAB — COMPLETE METABOLIC PANEL WITH GFR
AG Ratio: 1.4 (calc) (ref 1.0–2.5)
ALT: 19 U/L (ref 6–29)
AST: 22 U/L (ref 10–35)
Albumin: 4.1 g/dL (ref 3.6–5.1)
Alkaline phosphatase (APISO): 61 U/L (ref 37–153)
BUN: 18 mg/dL (ref 7–25)
CO2: 26 mmol/L (ref 20–32)
Calcium: 8.9 mg/dL (ref 8.6–10.4)
Chloride: 104 mmol/L (ref 98–110)
Creat: 0.6 mg/dL (ref 0.60–1.00)
Globulin: 2.9 g/dL (calc) (ref 1.9–3.7)
Glucose, Bld: 84 mg/dL (ref 65–99)
Potassium: 4.4 mmol/L (ref 3.5–5.3)
Sodium: 139 mmol/L (ref 135–146)
Total Bilirubin: 0.6 mg/dL (ref 0.2–1.2)
Total Protein: 7 g/dL (ref 6.1–8.1)
eGFR: 95 mL/min/{1.73_m2} (ref >=60)

## 2022-08-04 LAB — CBC WITH DIFFERENTIAL/PLATELET
Absolute Monocytes: 599 cells/uL (ref 200–950)
Basophils Absolute: 22 cells/uL (ref 0–200)
Basophils Relative: 0.4 %
Eosinophils Absolute: 50 cells/uL (ref 15–500)
Eosinophils Relative: 0.9 %
HCT: 39.8 % (ref 35.0–45.0)
Hemoglobin: 13.5 g/dL (ref 11.7–15.5)
Lymphs Abs: 2089 cells/uL (ref 850–3900)
MCH: 32.8 pg (ref 27.0–33.0)
MCHC: 33.9 g/dL (ref 32.0–36.0)
MCV: 96.8 fL (ref 80.0–100.0)
MPV: 9 fL (ref 7.5–12.5)
Monocytes Relative: 10.7 %
Neutro Abs: 2839 cells/uL (ref 1500–7800)
Neutrophils Relative %: 50.7 %
Platelets: 297 10*3/uL (ref 140–400)
RBC: 4.11 10*6/uL (ref 3.80–5.10)
RDW: 11.9 % (ref 11.0–15.0)
Total Lymphocyte: 37.3 %
WBC: 5.6 10*3/uL (ref 3.8–10.8)

## 2022-08-04 LAB — LIPID PANEL
Cholesterol: 183 mg/dL (ref <200)
HDL: 50 mg/dL (ref >=50)
LDL Cholesterol (Calc): 103 mg/dL (calc) — ABNORMAL HIGH
Non-HDL Cholesterol (Calc): 133 mg/dL (calc) — ABNORMAL HIGH (ref <130)
Total CHOL/HDL Ratio: 3.7 (calc) (ref <5.0)
Triglycerides: 182 mg/dL — ABNORMAL HIGH (ref <150)

## 2022-08-04 LAB — VITAMIN D 25 HYDROXY (VIT D DEFICIENCY, FRACTURES): Vit D, 25-Hydroxy: 28 ng/mL — ABNORMAL LOW (ref 30–100)

## 2022-09-13 ENCOUNTER — Ambulatory Visit
Admission: RE | Admit: 2022-09-13 | Discharge: 2022-09-13 | Disposition: A | Discharge status: Home / Self Care | Payer: Medicare HMO | Source: Ambulatory Visit | Attending: Family Medicine | Admitting: Family Medicine

## 2022-09-13 DIAGNOSIS — Z1231 Encounter for screening mammogram for malignant neoplasm of breast: Secondary | ICD-10-CM | POA: Insufficient documentation

## 2022-09-13 DIAGNOSIS — M8580 Other specified disorders of bone density and structure, unspecified site: Secondary | ICD-10-CM | POA: Insufficient documentation

## 2022-09-13 DIAGNOSIS — Z780 Asymptomatic menopausal state: Secondary | ICD-10-CM | POA: Diagnosis present

## 2022-11-06 ENCOUNTER — Other Ambulatory Visit: Payer: Self-pay | Admitting: Family Medicine

## 2022-11-06 DIAGNOSIS — G43009 Migraine without aura, not intractable, without status migrainosus: Secondary | ICD-10-CM

## 2022-12-04 ENCOUNTER — Other Ambulatory Visit: Payer: Self-pay | Admitting: Family Medicine

## 2022-12-04 DIAGNOSIS — G43009 Migraine without aura, not intractable, without status migrainosus: Secondary | ICD-10-CM

## 2022-12-13 ENCOUNTER — Other Ambulatory Visit: Payer: Self-pay | Admitting: Orthopedic Surgery

## 2022-12-13 DIAGNOSIS — M7582 Other shoulder lesions, left shoulder: Secondary | ICD-10-CM

## 2022-12-25 ENCOUNTER — Other Ambulatory Visit: Payer: Medicare HMO

## 2022-12-25 ENCOUNTER — Ambulatory Visit
Admission: RE | Admit: 2022-12-25 | Discharge: 2022-12-25 | Disposition: A | Discharge status: Home / Self Care | Payer: Medicare HMO | Source: Ambulatory Visit | Attending: Orthopedic Surgery | Admitting: Orthopedic Surgery

## 2022-12-25 DIAGNOSIS — M7582 Other shoulder lesions, left shoulder: Secondary | ICD-10-CM

## 2023-01-01 ENCOUNTER — Other Ambulatory Visit: Payer: Self-pay | Admitting: Family Medicine

## 2023-01-01 DIAGNOSIS — G43009 Migraine without aura, not intractable, without status migrainosus: Secondary | ICD-10-CM

## 2023-01-29 ENCOUNTER — Other Ambulatory Visit: Payer: Self-pay | Admitting: Family Medicine

## 2023-01-29 DIAGNOSIS — G43009 Migraine without aura, not intractable, without status migrainosus: Secondary | ICD-10-CM

## 2023-02-01 ENCOUNTER — Ambulatory Visit: Payer: Medicare HMO | Admitting: Family Medicine

## 2023-02-02 NOTE — Progress Notes (Deleted)
Name: Bonnie Owens   MRN: [id number]    DOB: March 04, 1951   Date:02/02/2023       Progress Note  Subjective  Chief Complaint  Follow Up  HPI  Hot flashes:stable, not on medication, states zoloft was given by cardiologist for anxiety and did not affect frequency or intensity of symptoms    Enterocele : she has noticed right lower quadrant fullness for months, it is intermittent not associated with change in bowel movements of bladder problems. She has a history of hysterectomy at age 77 and states right ovary removed - complicated surgery, but has left ovary, she also had bladder tech, she has seen  Dr. Carlena Hurl and GI : Dr. Gustavo Lah , he recommended exploratory surgery, she saw Dr. Fleet Contras but he recommended watchful waiting. She states the pain improved when she saw ortho and had a steroid injection, the right lower quadrant pain was secondary to nerve pain instead of gastrointestinal problem - seen by Dr. Roland Rack had a steroid injection on SI joint and pain has resolved.    Osteopenia: with low Vitamin D but last level was at goal, continue Boniva, vitamin D supplementation   History of esophageal dilation: 11/2018, following a GERD appropriate diet  No dysphagia or choking She eats earlier in the evenings. She continues to take Tums prn, previous history of gastritis but no problems at this time    Dyslipidemia: discussed results, she will continue life style modifications for now   The 10-year ASCVD risk score (Arnett DK, et al., 2019) is: 10.8%   Values used to calculate the score:     Age: 72 years     Sex: Female     Is Non-Hispanic African American: No     Diabetic: No     Tobacco smoker: No     Systolic Blood Pressure: 123XX123 mmHg     Is BP treated: No     HDL Cholesterol: 62 mg/dL     Total Cholesterol: 220 mg/dL    Migraine headaches: she states episodes are stable about 2 times a month, usually worse in the Fall .  Usually right frontal area, described as throbbing sometimes  sharp , denies phonophobia or photophobia. Imitrex helps alleviate symptoms , symptoms resolves within a couple of hours. She denies side effects of medication. She needs refills of Imitrex today    History Left knee partial replacement back in 2017: she went back to Dr. Rudene Christians because of increase in instability and popping sensation and was diagnosed with hard wear malfunction she had a revision done 01/2022 and is doing better, still has some pain if she is more active during the day and effusion but gradually improving.   MVP: taking cardizem, she states medication works well, seeing cardiologist - Dr. Ubaldo Glassing yearly and has a follow up next week. She still has palpitation but usually triggered by fatigue , she states she was feeling very anxious after her knee surgery and caused increase of palpitation so Dr. Ubaldo Glassing gave her low dose of zoloft and seems to have improved symptoms.   Patient Active Problem List   Diagnosis Date Noted   Status post revision of total replacement of left knee 01/05/2022   Strain of right hip 07/07/2019   Gastritis 11/26/2018   Hiatal hernia 11/26/2018   Palpitations 03/19/2017   History of basal cell carcinoma 11/20/2016   Primary osteoarthritis of left knee 03/09/2016   History of total knee replacement, left 03/09/2016   Osteopenia 11/17/2015   Migraine  without aura and without status migrainosus, not intractable 11/17/2015   Urge urinary incontinence 11/17/2015   Dyslipidemia 11/17/2015   Large breasts 11/17/2015   Menopause 11/17/2015   Vitamin D deficiency 11/17/2015   MVP (mitral valve prolapse) 11/17/2015   History of lumbar fusion 11/17/2015   Chronic constipation 11/17/2015   Venous insufficiency 11/17/2015   GERD (gastroesophageal reflux disease) 11/17/2015   Eustachian tube dysfunction 11/17/2015   Sciatica of left side     Past Surgical History:  Procedure Laterality Date   [place]      BASAL CELL CARCINOMA EXCISION Left 10/2016   Underneath Left Eye- [facility]- Dr. Nehemiah Massed   BLADDER SUSPENSION     COLONOSCOPY N/A 11/14/2017   Procedure: COLONOSCOPY;  Surgeon: Toledo, Benay Pike, MD;  Location: [facility] ENDOSCOPY;  Service: Endoscopy;  Laterality: N/A;   COLONOSCOPY WITH PROPOFOL N/A 09/01/2019   Procedure: COLONOSCOPY WITH PROPOFOL;  Surgeon: Lollie Sails, MD;  Location: [facility] ENDOSCOPY;  Service: Endoscopy;  Laterality: N/A;   ESOPHAGOGASTRODUODENOSCOPY (EGD) WITH PROPOFOL N/A 11/20/2018   Procedure: ESOPHAGOGASTRODUODENOSCOPY (EGD) WITH PROPOFOL;  Surgeon: Toledo, Benay Pike, MD;  Location: [facility] ENDOSCOPY;  Service: Gastroenterology;  Laterality: N/A;   JOINT REPLACEMENT Left    (LT) TKR 2017   LUMBAR DISC SURGERY  1995   sling procedure     TOTAL KNEE ARTHROPLASTY Left 03/09/2016   Procedure: TOTAL KNEE ARTHROPLASTY;  Surgeon: Hessie Knows, MD;  Location: [facility] ORS;  Service: Orthopedics;  Laterality: Left;   TOTAL KNEE REVISION Left 01/05/2022   Procedure: Left knee revision, femoral component and tibial insert;  Surgeon: Hessie Knows, MD;  Location: [facility] ORS;  Service: Orthopedics;  Laterality: Left;   TUBAL LIGATION  1980   tummy tuck      Family History  Problem Relation Age of Onset   Breast cancer Sister 51       twice, both breasts   Graves' disease Sister    Goiter Sister    Dementia Mother    Cancer Father        liver   Thyroid disease Brother    Prostate cancer Brother    Breast cancer Maternal Grandmother        ? age   Dysfunctional uterine bleeding Daughter    Dementia Maternal Grandfather    Cancer Paternal Grandmother     Social History   Tobacco Use   Smoking status: Never   Smokeless tobacco: Never  Substance Use Topics   Alcohol use: Not Currently     Current Outpatient Medications:    Ascorbic Acid (VITAMIN C PO), Take 1,000 mg by mouth daily., Disp: , Rfl:    Aspirin 81 MG CAPS, Take 81 mg by mouth daily., Disp: , Rfl:     calcium carbonate (TUMS - DOSED IN MG ELEMENTAL CALCIUM) 500 MG chewable tablet, Chew 1 tablet by mouth daily as needed for indigestion or heartburn., Disp: , Rfl:    Cholecalciferol (VITAMIN D-3) 125 MCG (5000 UT) TABS, Take 5,000 Units by mouth daily., Disp: , Rfl:    diltiazem (CARDIZEM CD) 120 MG 24 hr capsule, Take 1 capsule (120 mg total) by mouth daily., Disp: 30 capsule, Rfl: 12   ibandronate (BONIVA) 150 MG tablet, Take in the morning with a full glass of water, on an empty stomach, and do not take anything else by mouth or lie down for the next 30 min., Disp: 3 tablet, Rfl: 3  latanoprost (XALATAN) 0.005 % ophthalmic solution, Place 1 drop into both eyes at bedtime., Disp: , Rfl:    Misc Natural Products (OSTEO BI-FLEX JOINT SHIELD PO), Take 1 tablet by mouth daily., Disp: , Rfl:    polyethylene glycol powder (GLYCOLAX/MIRALAX) 17 GM/SCOOP powder, Take 17 g by mouth daily., Disp: 850 g, Rfl: 1   sertraline (ZOLOFT) 25 MG tablet, Take 25 mg by mouth daily., Disp: , Rfl:    SUMAtriptan (IMITREX) 100 MG tablet, Take 1 tablet (100 mg total) by mouth as needed., Disp: 27 tablet, Rfl: 0   vitamin B-12 (CYANOCOBALAMIN) 1000 MCG tablet, Take 1,000 mcg by mouth daily., Disp: , Rfl:   No Known Allergies  I personally reviewed active problem list, medication list, allergies, family history, social history, health maintenance with the patient/caregiver today.   ROS  ***  Objective  There were no vitals filed for this visit.  There is no height or weight on file to calculate BMI.  Physical Exam ***  No results found for this or any previous visit (from the past 2160 hour(s)).   PHQ2/9:    08/03/2022   10:40 AM 02/02/2022    3:00 PM 06/21/2021    9:17 AM 01/25/2021    9:33 AM 01/21/2021    9:35 AM  Depression screen PHQ 2/9  Decreased Interest 0 0 0 0 0  Down, Depressed, Hopeless 0 0 0 0 0  PHQ - 2 Score 0 0 0 0 0  Altered sleeping 0      Tired, decreased energy 0      Change in  appetite 0      Feeling bad or failure about yourself  0      Trouble concentrating 0      Moving slowly or fidgety/restless 0      Suicidal thoughts 0      PHQ-9 Score 0        phq 9 is {gen pos [id number]   Fall Risk:    08/03/2022   10:40 AM 02/02/2022    3:02 PM 06/21/2021    9:16 AM 01/25/2021    9:35 AM 01/21/2021    9:35 AM  Fall Risk   Falls in the past year? 0 0 0 0 0  Number falls in past yr: 0 0 0 0 0  Injury with Fall? 0 0 0 0 0  Risk for fall due to : No Fall Risks Orthopedic patient  No Fall Risks   Follow up Falls prevention discussed Falls prevention discussed  Falls prevention discussed       Functional Status Survey:      Assessment & Plan  *** There are no diagnoses linked to this encounter.

## 2023-02-05 ENCOUNTER — Ambulatory Visit: Payer: Medicare HMO | Admitting: Family Medicine

## 2023-02-06 ENCOUNTER — Ambulatory Visit: Payer: Medicare HMO

## 2023-02-09 ENCOUNTER — Ambulatory Visit (INDEPENDENT_AMBULATORY_CARE_PROVIDER_SITE_OTHER): Payer: Medicare HMO

## 2023-02-09 VITALS — Ht 62.0 in | Wt 142.0 lb

## 2023-02-09 DIAGNOSIS — Z0000 Encounter for general adult medical examination without abnormal findings: Secondary | ICD-10-CM

## 2023-02-09 NOTE — Progress Notes (Signed)
I connected with  [name] on 02/09/23 by a audio enabled telemedicine application and verified that I am speaking with the correct person using two identifiers.  Patient Location: Home  Provider Location: Office/Clinic  I discussed the limitations of evaluation and management by telemedicine. The patient expressed understanding and agreed to proceed.  Subjective:   Bonnie Owens is a 72 y.o. female who presents for Medicare Annual (Subsequent) preventive examination.  Review of Systems    Cardiac Risk Factors include: advanced age (>55mn, >>34women);dyslipidemia;sedentary lifestyle    Objective:    Today's Vitals   02/09/23 0914  Weight: 142 lb (64.4 kg)  Height: '5\' 2"'$  (1.575 m)   Body mass index is 25.97 kg/m.     02/09/2023    9:31 AM 02/02/2022    3:01 PM 01/05/2022    6:00 PM 12/27/2021    9:44 AM 01/25/2021    9:34 AM 12/02/2019    9:42 AM 09/01/2019    8:02 AM  Advanced Directives  Does Patient Have a Medical Advance Directive? Yes Yes Yes Yes Yes Yes Yes  Type of ACorporate treasurerof ADe GraffLiving will HMoores[place]Living will HGreensvilleLiving will HUnion CityLiving will HMachesney ParkLiving will   Does patient want to make changes to medical advance directive?   No - Patient declined      Copy of [place] Chart?  Yes - validated most recent copy scanned in chart (See row information)  Yes - validated most recent copy scanned in chart (See row information) Yes - validated most recent copy scanned in chart (See row information) Yes - validated most recent copy scanned in chart (See row information)     Current Medications (verified) Outpatient Encounter Medications as of 02/09/2023  Medication Sig   Ascorbic Acid (VITAMIN C PO) Take 1,000 mg by mouth daily.   Aspirin 81 MG CAPS Take 81 mg by mouth daily.   calcium carbonate (TUMS - DOSED IN MG  ELEMENTAL CALCIUM) 500 MG chewable tablet Chew 1 tablet by mouth daily as needed for indigestion or heartburn.   Cholecalciferol (VITAMIN D-3) 125 MCG (5000 UT) TABS Take 5,000 Units by mouth daily.   diltiazem (CARDIZEM CD) 120 MG 24 hr capsule Take 1 capsule (120 mg total) by mouth daily.   ibandronate (BONIVA) 150 MG tablet Take in the morning with a full glass of water, on an empty stomach, and do not take anything else by mouth or lie down for the next 30 min.   latanoprost (XALATAN) 0.005 % ophthalmic solution Place 1 drop into both eyes at bedtime.   Misc Natural Products (OSTEO BI-FLEX JOINT SHIELD PO) Take 1 tablet by mouth daily.   polyethylene glycol powder (GLYCOLAX/MIRALAX) 17 GM/SCOOP powder Take 17 g by mouth daily.   SUMAtriptan (IMITREX) 100 MG tablet Take 1 tablet (100 mg total) by mouth as needed.   vitamin B-12 (CYANOCOBALAMIN) 1000 MCG tablet Take 1,000 mcg by mouth daily.   sertraline (ZOLOFT) 25 MG tablet Take 25 mg by mouth daily. (Patient not taking: Reported on 02/09/2023)   No facility-administered encounter medications on file as of 02/09/2023.    Allergies (verified) Patient has no known allergies.   History: Past Medical History:  Diagnosis Date   Anemia    Anginal pain (HCC)    Chronic constipation    Complication of anesthesia    Bad headache when waking   Depression  Dyspnea    GERD (gastroesophageal reflux disease)    Glaucoma    Headache    occasional migraine   Hyperlipidemia    Left knee pain    Mitral valve prolapse    Osteopenia    Urine incontinence    Venous (peripheral) insufficiency    Vitamin D deficiency    Past Surgical History:  Procedure Laterality Date   ABDOMINAL HYSTERECTOMY  1985   APPENDECTOMY  1971   BACK SURGERY     BASAL CELL CARCINOMA EXCISION Left 10/2016   Underneath Left Eye- [facility]- Dr. Nehemiah Massed   BLADDER SUSPENSION     COLONOSCOPY N/A 11/14/2017   Procedure: COLONOSCOPY;  Surgeon: Toledo, Benay Pike, MD;   Location: [facility] ENDOSCOPY;  Service: Endoscopy;  Laterality: N/A;   COLONOSCOPY WITH PROPOFOL N/A 09/01/2019   Procedure: COLONOSCOPY WITH PROPOFOL;  Surgeon: Lollie Sails, MD;  Location: [facility] ENDOSCOPY;  Service: Endoscopy;  Laterality: N/A;   ESOPHAGOGASTRODUODENOSCOPY (EGD) WITH PROPOFOL N/A 11/20/2018   Procedure: ESOPHAGOGASTRODUODENOSCOPY (EGD) WITH PROPOFOL;  Surgeon: Toledo, Benay Pike, MD;  Location: [facility] ENDOSCOPY;  Service: Gastroenterology;  Laterality: N/A;   JOINT REPLACEMENT Left    (LT) TKR 2017   LUMBAR DISC SURGERY  1995   sling procedure     TOTAL KNEE ARTHROPLASTY Left 03/09/2016   Procedure: TOTAL KNEE ARTHROPLASTY;  Surgeon: Hessie Knows, MD;  Location: [facility] ORS;  Service: Orthopedics;  Laterality: Left;   TOTAL KNEE REVISION Left 01/05/2022   Procedure: Left knee revision, femoral component and tibial insert;  Surgeon: Hessie Knows, MD;  Location: [facility] ORS;  Service: Orthopedics;  Laterality: Left;   TUBAL LIGATION  1980   tummy tuck     Family History  Problem Relation Age of Onset   Breast cancer Sister 45       twice, both breasts   Graves' disease Sister    Goiter Sister    Dementia Mother    Cancer Father        liver   Thyroid disease Brother    Prostate cancer Brother    Breast cancer Maternal Grandmother        ? age   Dysfunctional uterine bleeding Daughter    Dementia Maternal Grandfather    Cancer Paternal Grandmother    Social History   Socioeconomic History   Marital status: Married    Spouse name: Coralyn Mark    Number of children: 2   Years of education: Not on file   Highest education level: 12th grade  Occupational History   Occupation: Estate agent. 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    Social Determinants of Health   Financial Resource Strain: Low Risk  (02/09/2023)   Overall Financial Resource Strain (CARDIA)    Difficulty of Paying Living Expenses: Not hard at all  Food Insecurity: No Food Insecurity (02/09/2023)   Hunger Vital Sign    Worried About Running Out of Food in the Last Year: Never true    Ran Out of Food in the Last  Year: Never true  Transportation Needs: No Transportation Needs (02/09/2023)   PRAPARE - Hydrologist (Medical): No    Lack of Transportation (Non-Medical): No  Physical Activity: Inactive (02/09/2023)   Exercise Vital Sign    Days of Exercise per Week: 0 days    Minutes of Exercise per Session: 0 min  Stress: No Stress Concern Present (02/09/2023)   [place]    Feeling of Stress : Not at all  Social Connections: [place] (02/09/2023)   Social Connection and Isolation Panel [NHANES]    Frequency of Communication with Friends and Family: More than three times a week    Frequency of Social Gatherings with Friends and Family: More than three times a week    Attends Religious Services: More than 4 times per year    Active Member of [employer] or Organizations: Yes    Attends Music therapist: More than 4 times per year    Marital Status: Married    Tobacco Counseling Counseling given: Not Answered   Clinical Intake:  Pre-visit preparation completed: Yes  Pain : No/denies pain  BMI - recorded: 25.97 Nutritional Status: BMI 25 -29 Overweight Nutritional Risks: None Diabetes: No  How often do you need to have someone help you when you read instructions, pamphlets, or other written materials from your doctor or pharmacy?: 1 - Never  Diabetic?NO  Interpreter Needed?: No  Information  entered by :: B.Megon Kalina,LPN   Activities of Daily Living    02/09/2023    9:32 AM 08/03/2022   10:41 AM  In your present state of health, do you have any difficulty performing the following activities:  Hearing? 0 0  Vision? 0 0  Difficulty concentrating or making decisions? 0 0  Walking or climbing stairs? 0 0  Dressing or bathing? 0 0  Doing errands, shopping? 0 0  Preparing Food and eating ? N   Using the Toilet? N   In the past six months, have you accidently leaked urine? N   Do you have problems with loss of bowel control? N   Managing your Medications? N   Managing your Finances? N   Housekeeping or managing your Housekeeping? N     Patient Care Team: Steele Sizer, MD as PCP - General (Family Medicine) Ubaldo Glassing Javier Docker, MD as Consulting Physician (Cardiology) Efrain Sella, MD as Consulting Physician (Gastroenterology) Hessie Knows, MD as Consulting Physician (Orthopedic Surgery)  Indicate any recent Medical Services you may have received from other than [facility] providers in the past year (date may be approximate).     Assessment:   This is a routine wellness examination for Bonnie Owens.  Hearing/Vision screen Hearing Screening - Comments:: Adequate hearing Vision Screening - Comments:: Adequate vision w/contacts Dr Matilde Sprang  Dietary issues and exercise activities discussed: Exercise limited by: orthopedic condition(s)   Goals Addressed             This Visit's Progress    Increase physical activity   Not on track    Recommend increasing physical activity to 150 minutes per week.        Depression Screen    02/09/2023    9:21 AM 08/03/2022   10:40 AM 02/02/2022    3:00 PM 06/21/2021    9:17 AM 01/25/2021    9:33 AM 01/21/2021    9:35 AM 07/21/2020    8:49 AM  PHQ 2/9 Scores  PHQ - 2 Score 0  0 0 0 0 0 0  PHQ- 9 Score  0     0    Fall Risk    02/09/2023    9:17 AM 08/03/2022   10:40 AM 02/02/2022    3:02 PM 06/21/2021    9:16 AM 01/25/2021    9:35 AM   [place] in the past year? 0 0 0 0 0  Number falls in past yr: 0 0 0 0 0  Injury with Fall? 0 0 0 0 0  Risk for fall due to : No Fall Risks No Fall Risks Orthopedic patient  No Fall Risks  Follow up Falls prevention discussed;Education provided Falls prevention discussed Falls prevention discussed  Falls prevention discussed    FALL RISK PREVENTION PERTAINING TO THE HOME:  Any stairs in or around the home? Yes  If so, are there any without handrails? Yes  Home free of loose throw rugs in walkways, pet beds, electrical cords, etc? Yes  Adequate lighting in your home to reduce risk of falls? Yes   ASSISTIVE DEVICES UTILIZED TO PREVENT FALLS:  Life alert? No  Use of a cane, walker or w/c? No  Grab bars in the bathroom? Yes  Shower chair or bench in shower? No  Elevated toilet seat or a handicapped toilet? No   Cognitive Function:        02/09/2023    9:34 AM 11/29/2018   10:11 AM  6CIT Screen  What Year? 0 points 0 points  What month? 0 points 0 points  What time? 0 points 0 points  Count back from 20 0 points 0 points  Months in reverse 2 points 2 points  Repeat phrase 2 points 2 points  Total Score 4 points 4 points    Immunizations Immunization History  Administered Date(s) Administered   Fluad Quad(high Dose 65+) 08/18/2019, 01/21/2021, 11/03/2022   Influenza Split 10/03/2012   Influenza, High Dose Seasonal PF 08/16/2018   Influenza, Seasonal, Injecte, Preservative Fre 10/02/2011   Influenza,inj,Quad PF,6+ Mos 08/24/2014, 09/16/2015, 09/29/2016   Influenza-Unspecified 08/24/2014, 09/14/2017   PFIZER(Purple Top)SARS-COV-2 Vaccination 01/15/2020, 02/05/2020, 08/17/2020   Pneumococcal Conjugate-13 11/20/2016   Pneumococcal Polysaccharide-23 04/14/2015   Tdap 05/16/2010   Zoster Recombinat (Shingrix) 09/14/2017, 12/13/2017   Zoster, Live 10/02/2011    TDAP status: Up to date  Flu Vaccine status: Up to date  Pneumococcal vaccine status: Up to  date  Covid-19 vaccine status: Completed vaccines  Qualifies for Shingles Vaccine? Yes   Zostavax completed No   Shingrix Completed?: Yes  Screening Tests Health Maintenance  Topic Date Due   DTaP/Tdap/Td (2 - Td or Tdap) 05/16/2020   COVID-19 Vaccine (4 - 2023-24 season) 08/04/2022   Medicare Annual Wellness (AWV)  02/09/2024   MAMMOGRAM  09/13/2024   COLONOSCOPY (Pts 45-52yr Insurance coverage will need to be confirmed)  08/31/2029   Pneumonia Vaccine 72 Years old  Completed   INFLUENZA VACCINE  Completed   DEXA SCAN  Completed   Hepatitis C Screening  Completed   Zoster Vaccines- Shingrix  Completed   HPV VACCINES  Aged Out    Health Maintenance  Health Maintenance Due  Topic Date Due   DTaP/Tdap/Td (2 - Td or Tdap) 05/16/2020   COVID-19 Vaccine (4 - 2023-24 season) 08/04/2022    Colorectal cancer screening: Type of screening: Colonoscopy. Completed yes. Repeat every 5 years  Mammogram status: Ordered yes. Pt provided with contact info and advised to call to schedule appt.  Due October  Bone  Density status: Completed yes. Results reflect: Bone density results: OSTEOPENIA. Repeat every 5 years.  Lung Cancer Screening: (Low Dose CT Chest recommended if Age 70-80 years, 30 pack-year currently smoking OR have quit w/in 15years.) does not qualify.   Lung Cancer Screening Referral: no  Additional Screening:  Hepatitis C Screening: does not qualify; Completed yes  Vision Screening: Recommended annual ophthalmology exams for early detection of glaucoma and other disorders of the eye. Is the patient up to date with their annual eye exam?  Yes  Who is the provider or what is the name of the office in which the patient attends annual eye exams? Dr Matilde Sprang If pt is not established with a provider, would they like to be referred to a provider to establish care? No .   Dental Screening: Recommended annual dental exams for proper oral hygiene  Community Resource Referral /  Chronic Care Management: CRR required this visit?  No   CCM required this visit?  No      Plan:     I have personally reviewed and noted the following in the patient's chart:   Medical and social history Use of alcohol, tobacco or illicit drugs  Current medications and supplements including opioid prescriptions. Patient is not currently taking opioid prescriptions. Functional ability and status Nutritional status Physical activity Advanced directives List of other physicians Hospitalizations, surgeries, and ER visits in previous 12 months Vitals Screenings to include cognitive, depression, and falls Referrals and appointments  In addition, I have reviewed and discussed with patient certain preventive protocols, quality metrics, and best practice recommendations. A written personalized care plan for preventive services as well as general preventive health recommendations were provided to patient.     Roger Shelter, LPN   [phone]   Nurse Notes: pt is doing well, she has

## 2023-02-26 NOTE — Progress Notes (Unsigned)
Name: Bonnie Owens   MRN: [id number]    DOB: 20-Aug-1951   Date:02/27/2023       Progress Note  Subjective  Chief Complaint  Follow Up  HPI  Hot flashes she tried Zoloft given by cardiologist but did not improve, she states she is getting used to it.    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 She has a history of hysterectomy at age 72 and states right ovary removed - complicated surgery, but has left ovary, she also had bladder tech, she has seen  Dr. Carlena Hurl and GI : Dr. Gustavo Lah , he recommended exploratory surgery, she saw Dr. Fleet Contras but he recommended watchful waiting. She states the pain improved when she saw ortho and had a steroid injection, on SI joint and back and symptoms improved. Reviewed last colonoscopy that was normal and repeat colonoscopy not due until 2030   Osteoporosis Menopause: on Boniva for many years and bone density done 2023 got worse, we will stop Boniva and send to Endo, also needs to take more vitamin D and high calcium diet    History of esophageal dilation: 11/2018, following a GERD appropriate diet  No dysphagia or choking She eats earlier in the evenings. She continues to take Tums prn, previous history of gastritis but no problems at this time Unchanged    Dyslipidemia: discussed results, she will continue life style modifications for now   The 10-year ASCVD risk score (Arnett DK, et al., 2019) is: 10.3%   Values used to calculate the score:     Age: 72 years     Sex: Female     Is Non-Hispanic African American: No     Diabetic: No     Tobacco smoker: No     Systolic Blood Pressure: 123456 mmHg     Is BP treated: No     HDL Cholesterol: 50 mg/dL     Total Cholesterol: 183 mg/dL    Migraine headaches: she states episodes are stable about 2-3 times per month  Usually right frontal area, described as throbbing sometimes sharp , denies  phonophobia or photophobia. Imitrex helps alleviate symptoms , symptoms resolves within a couple of hours. She denies side effects of medication. She states seems to get worse with Spring and Fall   History Left knee partial replacement back in 2017: she went back to Dr. Rudene Christians because of increase in instability and popping sensation and was diagnosed with hard wear malfunction she had a revision done 01/2022 and is doing better, still has some pain if she is more active during the day and effusion but stable   MVP: taking cardizem, she states medication works well, seeing cardiologist - Dr. Saralyn Pilar yearly and has a follow up next week. She still has palpitation but usually triggered by fatigue ,  symptoms are stable   Patient Active Problem List   Diagnosis Date Noted   Status post revision of total replacement of left knee 01/05/2022   Strain of right hip 07/07/2019   Gastritis 11/26/2018   Hiatal hernia 11/26/2018   Palpitations 03/19/2017   History of basal cell carcinoma 11/20/2016   Primary osteoarthritis of left knee 03/09/2016   History of total knee replacement, left 03/09/2016   Osteopenia 11/17/2015   Migraine without aura and without status migrainosus, not intractable 11/17/2015   Urge urinary incontinence 11/17/2015   Dyslipidemia 11/17/2015   Large breasts 11/17/2015  Menopause 11/17/2015   Vitamin D deficiency 11/17/2015   MVP (mitral valve prolapse) 11/17/2015   History of lumbar fusion 11/17/2015   Chronic constipation 11/17/2015   Venous insufficiency 11/17/2015   GERD (gastroesophageal reflux disease) 11/17/2015   Eustachian tube dysfunction 11/17/2015   Sciatica of left side     Past Surgical History:  Procedure Laterality Date   ABDOMINAL HYSTERECTOMY  1985   APPENDECTOMY  1971   BACK SURGERY     BASAL CELL CARCINOMA EXCISION Left 10/2016   Underneath Left Eye- [place] [facility]- Dr. Nehemiah Massed   BLADDER SUSPENSION     COLONOSCOPY N/A 11/14/2017   Procedure:  COLONOSCOPY;  Surgeon: Toledo, Benay Pike, MD;  Location: [facility] ENDOSCOPY;  Service: Endoscopy;  Laterality: N/A;   COLONOSCOPY WITH PROPOFOL N/A 09/01/2019   Procedure: COLONOSCOPY WITH PROPOFOL;  Surgeon: Lollie Sails, MD;  Location[facility];  Service: Endoscopy;  Laterality: N/A;   ESOPHAGOGASTRODUODENOSCOPY (EGD) WITH PROPOFOL N/A 11/20/2018   Procedure: ESOPHAGOGASTRODUODENOSCOPY (EGD) WITH PROPOFOL;  Surgeon: Toledo, Benay Pike, MD;  Location: [facility] ENDOSCOPY;  Service: Gastroenterology;  Laterality: N/A;   JOINT REPLACEMENT Left    (LT) TKR 2017   LUMBAR DISC SURGERY  1995   sling procedure     TOTAL KNEE ARTHROPLASTY Left 03/09/2016   Procedure: TOTAL KNEE ARTHROPLASTY;  Surgeon: Hessie Knows, MD;  Location: [facility] ORS;  Service: Orthopedics;  Laterality: Left;   TOTAL KNEE REVISION Left 01/05/2022   Procedure: Left knee revision, femoral component and tibial insert;  Surgeon: Hessie Knows, MD;  Location: [facility] ORS;  Service: Orthopedics;  Laterality: Left;   TUBAL LIGATION  1980   tummy tuck      Family History  Problem Relation Age of Onset   Breast cancer Sister 62       twice, both breasts   Graves' disease Sister    Goiter Sister    Dementia Mother    Cancer Father        liver   Thyroid disease Brother    Prostate cancer Brother    Breast cancer Maternal Grandmother        ? age   Dysfunctional uterine bleeding Daughter    Dementia Maternal Grandfather    Cancer Paternal Grandmother     Social History   Tobacco Use   Smoking status: Never   Smokeless tobacco: Never  Substance Use Topics   Alcohol use: Not Currently     Current Outpatient Medications:    Ascorbic Acid (VITAMIN C PO), Take 1,000 mg by mouth daily., Disp: , Rfl:    Aspirin 81 MG CAPS, Take 81 mg by mouth daily., Disp: , Rfl:    calcium carbonate (TUMS - DOSED IN MG ELEMENTAL CALCIUM) 500 MG chewable tablet, Chew 1 tablet by mouth daily as needed for indigestion or heartburn., Disp: ,  Rfl:    Cholecalciferol (VITAMIN D-3) 125 MCG (5000 UT) TABS, Take 5,000 Units by mouth daily., Disp: , Rfl:    diltiazem (CARDIZEM CD) 120 MG 24 hr capsule, Take 1 capsule (120 mg total) by mouth daily., Disp: 30 capsule, Rfl: 12   latanoprost (XALATAN) 0.005 % ophthalmic solution, Place 1 drop into both eyes at bedtime., Disp: , Rfl:    Misc Natural Products (OSTEO BI-FLEX JOINT SHIELD PO), Take 1 tablet by mouth daily., Disp: , Rfl:    polyethylene glycol powder (GLYCOLAX/MIRALAX) 17 GM/SCOOP powder, Take 17 g by mouth daily., Disp: 850 g, Rfl: 1   SUMAtriptan (IMITREX) 100 MG tablet, Take 1  tablet (100 mg total) by mouth as needed., Disp: 27 tablet, Rfl: 0   vitamin B-12 (CYANOCOBALAMIN) 1000 MCG tablet, Take 1,000 mcg by mouth daily., Disp: , Rfl:   No Known Allergies  I personally reviewed active problem list, medication list, allergies, family history, social history, health maintenance with the patient/caregiver today.   ROS  Constitutional: Negative for fever or weight change.  Respiratory: Negative for cough and shortness of breath.   Cardiovascular: Negative for chest pain and occasionally has  palpitations.  Gastrointestinal: Negative for abdominal pain, no bowel changes.  Musculoskeletal: Negative for gait problem or joint swelling.  Skin: Negative for rash.  Neurological: Negative for dizziness but has intermittent headache.  No other specific complaints in a complete review of systems (except as listed in HPI above).   Objective  Vitals:   02/27/23 0839  BP: 120/72  Pulse: 90  Resp: 14  Temp: 97.7 F (36.5 C)  TempSrc: Oral  SpO2: 94%  Weight: 149 lb 4.8 oz (67.7 kg)  Height: 5\' 2"  (1.575 m)    Body mass index is 27.31 kg/m.  Physical Exam  Constitutional: Patient appears well-developed and well-nourished.  No distress.  HEENT: head atraumatic, normocephalic, pupils equal and reactive to light, neck supple Cardiovascular: Normal rate, regular rhythm and  normal heart sounds.  No murmur heard. No BLE edema. Pulmonary/Chest: Effort normal and breath sounds normal. No respiratory distress. Abdominal: Soft.  There is no tenderness. Psychiatric: Patient has a normal mood and affect. behavior is normal. Judgment and thought content normal.    PHQ2/9:    02/27/2023    8:47 AM 02/09/2023    9:21 AM 08/03/2022   10:40 AM 02/02/2022    3:00 PM 06/21/2021    9:17 AM  Depression screen PHQ 2/9  Decreased Interest 0 0 0 0 0  Down, Depressed, Hopeless 0 0 0 0 0  PHQ - 2 Score 0 0 0 0 0  Altered sleeping 0  0    Tired, decreased energy 0  0    Change in appetite 0  0    Feeling bad or failure about yourself  0  0    Trouble concentrating 0  0    Moving slowly or fidgety/restless 0  0    Suicidal thoughts 0  0    PHQ-9 Score 0  0      phq 9 is negative   Fall Risk:    02/27/2023    8:46 AM 02/09/2023    9:17 AM 08/03/2022   10:40 AM 02/02/2022    3:02 PM 06/21/2021    9:16 AM  Fall Risk   Falls in the past year? 0 0 0 0 0  Number falls in past yr:  0 0 0 0  Injury with Fall?  0 0 0 0  Risk for fall due to : No Fall Risks No Fall Risks No Fall Risks Orthopedic patient   Follow up Falls prevention discussed;Education provided;Falls evaluation completed Falls prevention discussed;Education provided Falls prevention discussed Falls prevention discussed       Functional Status Survey: Is the patient deaf or have difficulty hearing?: Yes Does the patient have difficulty seeing, even when wearing glasses/contacts?: No Does the patient have difficulty concentrating, remembering, or making decisions?: No Does the patient have difficulty walking or climbing stairs?: No Does the patient have difficulty dressing or bathing?: No Does the patient have difficulty doing errands alone such as visiting a doctor's office or shopping?: No  Assessment & Plan  1. Migraine without aura and without status migrainosus, not intractable  - SUMAtriptan  (IMITREX) 100 MG tablet; Take 1 tablet (100 mg total) by mouth as needed.  Dispense: 27 tablet; Refill: 0  2. Gastroesophageal reflux disease without esophagitis  No dysphagia.   3. Vitamin D deficiency  Taking Vitamin D otc   4. MVP (mitral valve prolapse)  Stable  5. Dyslipidemia  She does not want medication  6. Chronic constipation  She has enterocele, does not want surgery, still taking Miralax   7. 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

## 2023-02-27 ENCOUNTER — Ambulatory Visit (INDEPENDENT_AMBULATORY_CARE_PROVIDER_SITE_OTHER): Payer: Medicare HMO | Admitting: Family Medicine

## 2023-02-27 ENCOUNTER — Encounter: Payer: Self-pay | Admitting: Family Medicine

## 2023-02-27 VITALS — BP 120/72 | HR 90 | Temp 97.7°F | Resp 14 | Ht 62.0 in | Wt 149.3 lb

## 2023-02-27 DIAGNOSIS — I341 Nonrheumatic mitral (valve) prolapse: Secondary | ICD-10-CM

## 2023-02-27 DIAGNOSIS — E559 Vitamin D deficiency, unspecified: Secondary | ICD-10-CM | POA: Diagnosis not present

## 2023-02-27 DIAGNOSIS — K219 Gastro-esophageal reflux disease without esophagitis: Secondary | ICD-10-CM

## 2023-02-27 DIAGNOSIS — E785 Hyperlipidemia, unspecified: Secondary | ICD-10-CM

## 2023-02-27 DIAGNOSIS — K5909 Other constipation: Secondary | ICD-10-CM

## 2023-02-27 DIAGNOSIS — M810 Age-related osteoporosis without current pathological fracture: Secondary | ICD-10-CM

## 2023-02-27 DIAGNOSIS — G43009 Migraine without aura, not intractable, without status migrainosus: Secondary | ICD-10-CM

## 2023-02-27 MED ORDER — SUMATRIPTAN SUCCINATE 100 MG PO TABS: 100.0000 mg | ORAL_TABLET | ORAL | 0 refills | Status: DC | PRN

## 2023-02-27 NOTE — Patient Instructions (Signed)
Tdap

## 2023-03-16 ENCOUNTER — Telehealth: Payer: Self-pay | Admitting: Family Medicine

## 2023-03-16 NOTE — Telephone Encounter (Signed)
Patient called in about the endocrnolgy referral in [place], she wants to see if she an get on in [place] instead and sooner than 08/16 as stated with current referral. Please call back

## 2023-03-16 NOTE — Telephone Encounter (Signed)
Called patient and informed her to call the endocrinologist and ask to be put on a cancellation list. She gave verbal understanding.

## 2023-09-07 NOTE — Progress Notes (Unsigned)
Name: Bonnie Owens   MRN: [id number]    DOB: 12-05-1950   Date:09/10/2023       Progress Note  Subjective  Chief Complaint  Follow Up  HPI  Hot flashes she tried Zoloft given by cardiologist but did not improve, she states she is getting used to it. Stable    Bowel incontinence : she has noticed right lower quadrant fullness for months, it is intermittent not associated with change in bowel movements of bladder problems. She has a history of hysterectomy at age 1 and states right ovary removed - complicated surgery, but has left ovary, she also had bladder tack she has seen  Dr. Orvilla Fus and GI : Dr. Marva Panda , he recommended exploratory surgery, she saw Dr. Birdie Sons but he recommended watchful waiting. She states the pain improved when she saw ortho and had a steroid injection, on SI joint and back and symptoms improved. Reviewed last colonoscopy done in 2020. She states having pressure on RLQ on and off that improves with a bowel movements, also had four episodes of bowel incontinence after a very mild sensation of pressure on her rectum - that started two months ago. We will send her back to see GI    Osteoporosis Menopause: on Boniva for many years and bone density done 2023 got worse, we stopped Boniva and she was seen by Endo and had first dose of Prolia in May 2024    History of esophageal dilation: 11/2018, following a GERD appropriate diet  No dysphagia or choking She eats earlier in the evenings. She continues to take Tums prn, previous history of gastritis but no problems at this time.    Dyslipidemia: discussed results, she will continue life style modifications for now   The 10-year ASCVD risk score (Arnett DK, et al., 2019) is: 11.1%   Values used to calculate the score:     Age: 72 years     Sex: Female     Is Non-Hispanic African American: No     Diabetic: No     Tobacco smoker: No     Systolic Blood Pressure: 118 mmHg     Is BP treated: No     HDL Cholesterol: 50  mg/dL     Total Cholesterol: 183 mg/dL    Migraine headaches: she states episodes are less frequent now, about once a month.  Usually right frontal area, described as throbbing sometimes sharp denies phonophobia or photophobia. Imitrex helps alleviate symptoms , symptoms resolves within a couple of hours. She denies side effects of medication.  Hearing aids:  using it since September 1 st 2024   History Left knee partial replacement back in 2017: she went back to Dr. Rosita Kea because of increase in instability and popping sensation and was diagnosed with hard wear malfunction she had a revision done 01/2022 and is doing well at this time   MVP: taking cardizem, she states medication works well, seeing cardiologist - Dr. Darrold Junker yearly and has a follow up next week. She still has palpitation but usually triggered by fatigue , she is doing well   Patient Active Problem List   Diagnosis Date Noted   Status post revision of total replacement of left knee 01/05/2022   Strain of right hip 07/07/2019   Gastritis 11/26/2018   Hiatal hernia 11/26/2018   Palpitations 03/19/2017   History of basal cell carcinoma 11/20/2016   Primary osteoarthritis of left knee 03/09/2016   History of total knee replacement, left 03/09/2016   Osteopenia  11/17/2015   Migraine without aura and without status migrainosus, not intractable 11/17/2015   Urge urinary incontinence 11/17/2015   Dyslipidemia 11/17/2015   Large breasts 11/17/2015   Menopause 11/17/2015   Vitamin D deficiency 11/17/2015   MVP (mitral valve prolapse) 11/17/2015   History of lumbar fusion 11/17/2015   Chronic constipation 11/17/2015   Venous insufficiency 11/17/2015   GERD (gastroesophageal reflux disease) 11/17/2015   Eustachian tube dysfunction 11/17/2015   Sciatica of left side     Past Surgical History:  Procedure Laterality Date   ABDOMINAL HYSTERECTOMY  1985   APPENDECTOMY  1971   BACK SURGERY     BASAL CELL CARCINOMA EXCISION  Left 10/2016   Underneath Left Eye- [place] [facility]- Dr. Gwen Pounds   BLADDER SUSPENSION     COLONOSCOPY N/A 11/14/2017   Procedure: COLONOSCOPY;  Surgeon: Toledo, Boykin Nearing, MD;  Location: [facility] ENDOSCOPY;  Service: Endoscopy;  Laterality: N/A;   COLONOSCOPY WITH PROPOFOL N/A 09/01/2019   Procedure: COLONOSCOPY WITH PROPOFOL;  Surgeon: Christena Deem, MD;  Location: [facility] ENDOSCOPY;  Service: Endoscopy;  Laterality: N/A;   ESOPHAGOGASTRODUODENOSCOPY (EGD) WITH PROPOFOL N/A 11/20/2018   Procedure: ESOPHAGOGASTRODUODENOSCOPY (EGD) WITH PROPOFOL;  Surgeon: Toledo, Boykin Nearing, MD;  Location: [facility] ENDOSCOPY;  Service: Gastroenterology;  Laterality: N/A;   JOINT REPLACEMENT Left    (LT) TKR 2017   LUMBAR DISC SURGERY  1995   sling procedure     TOTAL KNEE ARTHROPLASTY Left 03/09/2016   Procedure: TOTAL KNEE ARTHROPLASTY;  Surgeon: Kennedy Bucker, MD;  Location: [facility] ORS;  Service: Orthopedics;  Laterality: Left;   TOTAL KNEE REVISION Left 01/05/2022   Procedure: Left knee revision, femoral component and tibial insert;  Surgeon: Kennedy Bucker, MD;  Location: [facility] ORS;  Service: Orthopedics;  Laterality: Left;   TUBAL LIGATION  1980   tummy tuck      Family History  Problem Relation Age of Onset   Breast cancer Sister 60       twice, both breasts   Graves' disease Sister    Goiter Sister    Dementia Mother    Cancer Father        liver   Thyroid disease Brother    Prostate cancer Brother    Breast cancer Maternal Grandmother        ? age   Dysfunctional uterine bleeding Daughter    Dementia Maternal Grandfather    Cancer Paternal Grandmother     Social History   Tobacco Use   Smoking status: Never   Smokeless tobacco: Never  Substance Use Topics   Alcohol use: Not Currently     Current Outpatient Medications:    Ascorbic Acid (VITAMIN C PO), Take 1,000 mg by mouth daily., Disp: , Rfl:    Aspirin 81 MG CAPS, Take 81 mg by mouth daily., Disp: , Rfl:    calcium carbonate (TUMS -  DOSED IN MG ELEMENTAL CALCIUM) 500 MG chewable tablet, Chew 1 tablet by mouth daily as needed for indigestion or heartburn., Disp: , Rfl:    Cholecalciferol (VITAMIN D-3) 125 MCG (5000 UT) TABS, Take 5,000 Units by mouth daily., Disp: , Rfl:    diltiazem (CARDIZEM CD) 120 MG 24 hr capsule, Take 1 capsule (120 mg total) by mouth daily., Disp: 30 capsule, Rfl: 12   latanoprost (XALATAN) 0.005 % ophthalmic solution, Place 1 drop into both eyes at bedtime., Disp: , Rfl:    Misc Natural Products (OSTEO BI-FLEX JOINT SHIELD PO), Take 1 tablet by mouth daily., Disp: , Rfl:  polyethylene glycol powder (GLYCOLAX/MIRALAX) 17 GM/SCOOP powder, Take 17 g by mouth daily., Disp: 850 g, Rfl: 1   SUMAtriptan (IMITREX) 100 MG tablet, Take 1 tablet (100 mg total) by mouth as needed., Disp: 27 tablet, Rfl: 0   vitamin B-12 (CYANOCOBALAMIN) 1000 MCG tablet, Take 1,000 mcg by mouth daily., Disp: , Rfl:   No Known Allergies  I personally reviewed active problem list, medication list, allergies, family history, social history, health maintenance with the patient/caregiver today.   ROS  Ten systems reviewed and is negative except as mentioned in HPI    Objective  Vitals:   09/10/23 0836  BP: 118/72  Pulse: 87  Resp: 16  SpO2: 99%  Weight: 143 lb (64.9 kg)  Height: 5\' 2"  (1.575 m)    Body mass index is 26.16 kg/m.  Physical Exam  Constitutional: Patient appears well-developed and well-nourished.  No distress.  HEENT: head atraumatic, normocephalic, pupils equal and reactive to light, neck supple, throat within normal limits Cardiovascular: Normal rate, regular rhythm and normal heart sounds.  No murmur heard. No BLE edema. Pulmonary/Chest: Effort normal and breath sounds normal. No respiratory distress. Abdominal: Soft.  There is no tenderness. Psychiatric: Patient has a normal mood and affect. behavior is normal. Judgment and thought content normal.   PHQ2/9:    09/10/2023    8:36 AM 02/27/2023     8:47 AM 02/09/2023    9:21 AM 08/03/2022   10:40 AM 02/02/2022    3:00 PM  Depression screen PHQ 2/9  Decreased Interest 0 0 0 0 0  Down, Depressed, Hopeless 0 0 0 0 0  PHQ - 2 Score 0 0 0 0 0  Altered sleeping 0 0  0   Tired, decreased energy 0 0  0   Change in appetite 0 0  0   Feeling bad or failure about yourself  0 0  0   Trouble concentrating 0 0  0   Moving slowly or fidgety/restless 0 0  0   Suicidal thoughts 0 0  0   PHQ-9 Score 0 0  0     phq 9 is negative   Fall Risk:    09/10/2023    8:36 AM 02/27/2023    8:46 AM 02/09/2023    9:17 AM 08/03/2022   10:40 AM 02/02/2022    3:02 PM  Fall Risk   Falls in the past year? 0 0 0 0 0  Number falls in past yr: 0  0 0 0  Injury with Fall? 0  0 0 0  Risk for fall due to : No Fall Risks No Fall Risks No Fall Risks No Fall Risks Orthopedic patient  Follow up Falls prevention discussed Falls prevention discussed;Education provided;Falls evaluation completed Falls prevention discussed;Education provided Falls prevention discussed Falls prevention discussed      Functional Status Survey: Is the patient deaf or have difficulty hearing?: Yes Does the patient have difficulty seeing, even when wearing glasses/contacts?: No Does the patient have difficulty concentrating, remembering, or making decisions?: No Does the patient have difficulty walking or climbing stairs?: No Does the patient have difficulty dressing or bathing?: No Does the patient have difficulty doing errands alone such as visiting a doctor's office or shopping?: No    Assessment & Plan  1. Full incontinence of feces  - Ambulatory referral to Gastroenterology - CT ABDOMEN PELVIS WO CONTRAST; Future  2. Migraine without aura and without status migrainosus, not intractable  Continue Imitrex Prn  3. Gastroesophageal reflux  disease without esophagitis  Doing well   4. Dyslipidemia  Does not want to take medications  5. Need for immunization against  influenza  - Flu Vaccine Trivalent High Dose (Fluad)  6. Vitamin D deficiency  Continue supplementation  7. Right lower quadrant abdominal pain  - CT ABDOMEN PELVIS WO CONTRAST; Future

## 2023-09-10 ENCOUNTER — Encounter: Payer: Self-pay | Admitting: Family Medicine

## 2023-09-10 ENCOUNTER — Ambulatory Visit (INDEPENDENT_AMBULATORY_CARE_PROVIDER_SITE_OTHER): Payer: Medicare HMO | Admitting: Family Medicine

## 2023-09-10 VITALS — BP 118/72 | HR 87 | Resp 16 | Ht 62.0 in | Wt 143.0 lb

## 2023-09-10 DIAGNOSIS — Z23 Encounter for immunization: Secondary | ICD-10-CM

## 2023-09-10 DIAGNOSIS — K219 Gastro-esophageal reflux disease without esophagitis: Secondary | ICD-10-CM

## 2023-09-10 DIAGNOSIS — E785 Hyperlipidemia, unspecified: Secondary | ICD-10-CM

## 2023-09-10 DIAGNOSIS — G43009 Migraine without aura, not intractable, without status migrainosus: Secondary | ICD-10-CM | POA: Diagnosis not present

## 2023-09-10 DIAGNOSIS — Z1231 Encounter for screening mammogram for malignant neoplasm of breast: Secondary | ICD-10-CM

## 2023-09-10 DIAGNOSIS — E559 Vitamin D deficiency, unspecified: Secondary | ICD-10-CM

## 2023-09-10 DIAGNOSIS — R159 Full incontinence of feces: Secondary | ICD-10-CM | POA: Diagnosis not present

## 2023-09-10 DIAGNOSIS — Z974 Presence of external hearing-aid: Secondary | ICD-10-CM | POA: Insufficient documentation

## 2023-09-10 DIAGNOSIS — R1031 Right lower quadrant pain: Secondary | ICD-10-CM

## 2023-09-10 NOTE — Patient Instructions (Addendum)
Schedule your mammogram Ask pharmacy about Tdap vaccine Contact Dr. Tedd Sias: [phone]

## 2023-09-18 ENCOUNTER — Ambulatory Visit
Admission: RE | Admit: 2023-09-18 | Discharge: 2023-09-18 | Disposition: A | Discharge status: Home / Self Care | Payer: Medicare HMO | Source: Ambulatory Visit | Attending: Family Medicine | Admitting: Family Medicine

## 2023-09-18 DIAGNOSIS — Z1231 Encounter for screening mammogram for malignant neoplasm of breast: Secondary | ICD-10-CM | POA: Insufficient documentation

## 2023-09-21 ENCOUNTER — Ambulatory Visit
Admission: RE | Admit: 2023-09-21 | Discharge: 2023-09-21 | Disposition: A | Discharge status: Home / Self Care | Payer: Medicare HMO | Source: Ambulatory Visit | Attending: Family Medicine | Admitting: Family Medicine

## 2023-09-21 DIAGNOSIS — R1031 Right lower quadrant pain: Secondary | ICD-10-CM | POA: Insufficient documentation

## 2023-09-21 DIAGNOSIS — R159 Full incontinence of feces: Secondary | ICD-10-CM | POA: Insufficient documentation

## 2023-11-07 ENCOUNTER — Other Ambulatory Visit: Payer: Self-pay

## 2023-11-12 ENCOUNTER — Encounter: Payer: Self-pay | Admitting: Gastroenterology

## 2023-11-12 ENCOUNTER — Ambulatory Visit: Payer: Medicare HMO | Admitting: Gastroenterology

## 2023-11-12 VITALS — BP 124/82 | HR 68 | Temp 97.7°F | Ht 62.0 in | Wt 140.2 lb

## 2023-11-12 DIAGNOSIS — G8929 Other chronic pain: Secondary | ICD-10-CM

## 2023-11-12 DIAGNOSIS — R1031 Right lower quadrant pain: Secondary | ICD-10-CM | POA: Diagnosis not present

## 2023-11-12 DIAGNOSIS — R195 Other fecal abnormalities: Secondary | ICD-10-CM

## 2023-11-12 NOTE — Patient Instructions (Signed)
Got your CT scan schedule for you on 11/19/2023 [facility] 1:45pm for a 3:00pm scan. If you need to reschedule please call [phone] option 3 and then option 2.

## 2023-11-12 NOTE — Progress Notes (Signed)
Arlyss Repress, MD [address]  [address]  [place], [place] [address]  Main: [phone]  Fax: [phone]    Gastroenterology Consultation  Referring Provider:     Alba Cory, MD Primary Care Physician:  Alba Cory, MD Primary Gastroenterologist:  Dr. Arlyss Repress Reason for Consultation: Fecal incontinence, right lower quadrant pain        HPI:   Bonnie Owens is a 72 y.o. female referred by Dr. Alba Cory, MD  for consultation & management of fecal incontinence and right lower quadrant pain  Patient has chronic symptoms of right lower quadrant fullness, which is intermittent.  She had history of hysterectomy at age 85, right ovary was removed, complicated surgery, history of cystocele status post bladder tack.  Patient was evaluated by Dr. Santina Evans clinic gastroenterologist in 2019 and 2020.  Underwent colonoscopy which revealed diverticulosis, she was told she has redundant colon.  Terminal ileum was not examined.  Patient is concerned about 3 episodes of bowel incontinence which happened approximately in August 2024.  The stools were soft in consistency.  Since then, she did not have any further episodes of fecal incontinence.  She has to take MiraLAX half capful every day in order to move her bowels.  Even if she does not take MiraLAX, she would have soft bowel movement after 2 days.  She denies any abdominal bloating.  She does not report right lower quadrant pain, does report discomfort only she also has right lower quadrant tenderness which is intermittent and improves with a bowel movement.  Therefore, referred to GI for further evaluation She underwent CT abdomen pelvis without contrast through her PCP in November 2024, revealed mild haziness of the mesentery surrounding several loops of proximal small bowel in the left upper abdomen.  She is current watching weight watchers and trying to lose weight.  Reports eating healthy  NSAIDs:  None  Antiplts/Anticoagulants/Anti thrombotics: None  GI Procedures:  EGD 11/20/2018 and colonoscopy 09/01/2019 - Gastritis. - 2 cm hiatal hernia. - Normal examined duodenum. - The examination was otherwise normal. - No specimens collected.  - Diverticulosis in the sigmoid colon and in the descending colon. - Redundant colon. - The distal rectum and anal verge are normal on retroflexion view. - The examination was otherwise normal. - No specimens collected.  Past Medical History:  Diagnosis Date   Anemia    Anginal pain (HCC)    Chronic constipation    Complication of anesthesia    Bad headache when waking   Depression    Dyspnea    GERD (gastroesophageal reflux disease)    Glaucoma    Headache    occasional migraine   Hyperlipidemia    Left knee pain    Mitral valve prolapse    Osteopenia    Urine incontinence    Venous (peripheral) insufficiency    Vitamin D deficiency     Past Surgical History:  Procedure Laterality Date   ABDOMINAL HYSTERECTOMY  1985   APPENDECTOMY  1971   BACK SURGERY     BASAL CELL CARCINOMA EXCISION Left 10/2016   Underneath Left Eye- [facility]- Dr. Gwen Pounds   BLADDER SUSPENSION     COLONOSCOPY N/A 11/14/2017   Procedure: COLONOSCOPY;  Surgeon: Toledo, Boykin Nearing, MD;  Location: [facility] ENDOSCOPY;  Service: Endoscopy;  Laterality: N/A;   COLONOSCOPY WITH PROPOFOL N/A 09/01/2019   Procedure: COLONOSCOPY WITH PROPOFOL;  Surgeon: Christena Deem, MD;  Location: [facility];  Service: Endoscopy;  Laterality: N/A;   ESOPHAGOGASTRODUODENOSCOPY (EGD) WITH PROPOFOL N/A 11/20/2018   Procedure: ESOPHAGOGASTRODUODENOSCOPY (EGD) WITH PROPOFOL;  Surgeon: Toledo, Boykin Nearing, MD;  Location: [facility] ENDOSCOPY;  Service: Gastroenterology;  Laterality: N/A;   JOINT REPLACEMENT Left    (LT) TKR 2017   LUMBAR DISC SURGERY  1995   sling procedure     TOTAL KNEE ARTHROPLASTY Left 03/09/2016   Procedure: TOTAL KNEE ARTHROPLASTY;  Surgeon: Kennedy Bucker, MD;   Location: [facility] ORS;  Service: Orthopedics;  Laterality: Left;   TOTAL KNEE REVISION Left 01/05/2022   Procedure: Left knee revision, femoral component and tibial insert;  Surgeon: Kennedy Bucker, MD;  Location: [facility] ORS;  Service: Orthopedics;  Laterality: Left;   TUBAL LIGATION  1980   tummy tuck       Current Outpatient Medications:    Ascorbic Acid (VITAMIN C PO), Take 1,000 mg by mouth daily., Disp: , Rfl:    Aspirin 81 MG CAPS, Take 81 mg by mouth daily., Disp: , Rfl:    calcium carbonate (TUMS - DOSED IN MG ELEMENTAL CALCIUM) 500 MG chewable tablet, Chew 1 tablet by mouth daily as needed for indigestion or heartburn., Disp: , Rfl:    Cholecalciferol (VITAMIN D-3) 125 MCG (5000 UT) TABS, Take 5,000 Units by mouth daily., Disp: , Rfl:    denosumab (PROLIA) 60 MG/ML SOSY injection, Inject 60 mg into the skin every 6 (six) months., Disp: , Rfl:    diltiazem (CARDIZEM CD) 120 MG 24 hr capsule, Take 1 capsule (120 mg total) by mouth daily., Disp: 30 capsule, Rfl: 12   latanoprost (XALATAN) 0.005 % ophthalmic solution, Place 1 drop into both eyes at bedtime., Disp: , Rfl:    Misc Natural Products (OSTEO BI-FLEX JOINT SHIELD PO), Take 1 tablet by mouth daily., Disp: , Rfl:    polyethylene glycol powder (GLYCOLAX/MIRALAX) 17 GM/SCOOP powder, Take 17 g by mouth daily., Disp: 850 g, Rfl: 1   SUMAtriptan (IMITREX) 100 MG tablet, Take 1 tablet (100 mg total) by mouth as needed., Disp: 27 tablet, Rfl: 0   vitamin B-12 (CYANOCOBALAMIN) 1000 MCG tablet, Take 1,000 mcg by mouth daily., Disp: , Rfl:    Family History  Problem Relation Age of Onset   Breast cancer Sister 60       twice, both breasts   Graves' disease Sister    Goiter Sister    Dementia Mother    Cancer Father        liver   Thyroid disease Brother    Prostate cancer Brother    Breast cancer Maternal Grandmother        ? age   Dysfunctional uterine bleeding Daughter    Dementia Maternal Grandfather    Cancer Paternal Grandmother       Social History   Tobacco Use   Smoking status: Never   Smokeless tobacco: Never  Vaping Use   Vaping status: Never Used  Substance Use Topics   Alcohol use: Not Currently   Drug use: Never    Allergies as of 11/12/2023   (No Known Allergies)    Review of Systems:    All systems reviewed and negative except where noted in HPI.   Physical Exam:  BP 124/82 (BP Location: Right Arm, Patient Position: Sitting, Cuff Size: Normal)   Pulse 68   Temp 97.7 F (36.5 C) (Oral)   Ht 5\' 2"  (1.575 m)   Wt 140 lb 4 oz (63.6 kg)   LMP  (LMP Unknown)   BMI 25.65 kg/m  No LMP recorded (lmp unknown). Patient has had a hysterectomy.  General:   Alert,  Well-developed, well-nourished, pleasant and cooperative in NAD Head:  Normocephalic and atraumatic. Eyes:  Sclera clear, no icterus.   Conjunctiva pink. Ears:  Normal auditory acuity. Nose:  No deformity, discharge, or lesions. Mouth:  No deformity or lesions,oropharynx pink & moist. Neck:  Supple; no masses or thyromegaly. Lungs:  Respirations even and unlabored.  Clear throughout to auscultation.   No wheezes, crackles, or rhonchi. No acute distress. Heart:  Regular rate and rhythm; no murmurs, clicks, rubs, or gallops. Abdomen:  Normal bowel sounds. Soft, non-tender and non-distended without masses, hepatosplenomegaly or hernias noted.  No guarding or rebound tenderness.   Rectal: Not performed Msk:  Symmetrical without gross deformities. Good, equal movement & strength bilaterally. Pulses:  Normal pulses noted. Extremities:  No clubbing or edema.  No cyanosis. Neurologic:  Alert and oriented x3;  grossly normal neurologically. Skin:  Intact without significant lesions or rashes. No jaundice. Psych:  Alert and cooperative. Normal mood and affect.  Imaging Studies: Reviewed  Assessment and Plan:   KARILYN CRUSER is a 72 y.o. female with chronic right lower quadrant discomfort with loose stools, no evidence of  anemia  Recommend CT enterography Check fecal calprotectin levels Colonoscopy based on the above workup  Follow up based on the above workup   Arlyss Repress, MD

## 2023-11-17 LAB — CALPROTECTIN, FECAL: Calprotectin, Fecal: 20 ug/g (ref 0–120)

## 2023-11-19 ENCOUNTER — Ambulatory Visit: Payer: Medicare HMO

## 2023-11-22 ENCOUNTER — Ambulatory Visit
Admission: RE | Admit: 2023-11-22 | Discharge: 2023-11-22 | Disposition: A | Discharge status: Home / Self Care | Payer: Medicare HMO | Source: Ambulatory Visit | Attending: Gastroenterology | Admitting: Gastroenterology

## 2023-11-22 DIAGNOSIS — R1031 Right lower quadrant pain: Secondary | ICD-10-CM | POA: Insufficient documentation

## 2023-11-22 MED ORDER — IOHEXOL 300 MG/ML  SOLN
100.0000 mL | Freq: Once | INTRAMUSCULAR | Status: AC | PRN
  Administered 2023-11-22: 100 mL via INTRAVENOUS

## 2023-12-10 ENCOUNTER — Other Ambulatory Visit: Payer: Self-pay

## 2023-12-10 ENCOUNTER — Telehealth: Payer: Self-pay

## 2023-12-10 DIAGNOSIS — R933 Abnormal findings on diagnostic imaging of other parts of digestive tract: Secondary | ICD-10-CM

## 2023-12-10 MED ORDER — NA SULFATE-K SULFATE-MG SULF 17.5-3.13-1.6 GM/177ML PO SOLN: 354.0000 mL | Freq: Once | ORAL | 0 refills | Status: AC

## 2023-12-10 NOTE — Telephone Encounter (Signed)
-----   Message from [facility] sent at 12/09/2023 11:38 PM EST ----- Recommend colonoscopy given abnormal CT colon findings suggestive of inflammatory bowel disease   RV

## 2023-12-10 NOTE — Telephone Encounter (Signed)
 Patient verbalized understanding results is okay scheduling the colonoscopy she wants to do 01/10/2024. Went over instructions, mailed them and sent to [employer]. Sent prep to the pharmacy

## 2024-01-03 ENCOUNTER — Encounter: Payer: Self-pay | Admitting: Gastroenterology

## 2024-01-10 ENCOUNTER — Ambulatory Visit: Payer: Medicare HMO | Admitting: Anesthesiology

## 2024-01-10 ENCOUNTER — Ambulatory Visit
Admission: RE | Admit: 2024-01-10 | Discharge: 2024-01-10 | Disposition: A | Discharge status: Home / Self Care | Payer: Medicare HMO | Attending: Gastroenterology | Admitting: Gastroenterology

## 2024-01-10 ENCOUNTER — Encounter: Payer: Self-pay | Admitting: Gastroenterology

## 2024-01-10 ENCOUNTER — Encounter
Admission: RE | Disposition: A | Discharge status: Home / Self Care | Payer: Self-pay | Source: Home / Self Care | Attending: Gastroenterology

## 2024-01-10 DIAGNOSIS — G43909 Migraine, unspecified, not intractable, without status migrainosus: Secondary | ICD-10-CM | POA: Insufficient documentation

## 2024-01-10 DIAGNOSIS — R1031 Right lower quadrant pain: Secondary | ICD-10-CM | POA: Diagnosis present

## 2024-01-10 DIAGNOSIS — R933 Abnormal findings on diagnostic imaging of other parts of digestive tract: Secondary | ICD-10-CM

## 2024-01-10 DIAGNOSIS — K5730 Diverticulosis of large intestine without perforation or abscess without bleeding: Secondary | ICD-10-CM | POA: Insufficient documentation

## 2024-01-10 HISTORY — PX: COLONOSCOPY WITH PROPOFOL: SHX5780

## 2024-01-10 SURGERY — COLONOSCOPY WITH PROPOFOL
Anesthesia: General

## 2024-01-10 MED ORDER — LIDOCAINE HCL (CARDIAC) PF 100 MG/5ML IV SOSY
PREFILLED_SYRINGE | INTRAVENOUS | Status: DC | PRN
  Administered 2024-01-10: 60 mg via INTRAVENOUS

## 2024-01-10 MED ORDER — PROPOFOL 500 MG/50ML IV EMUL
INTRAVENOUS | Status: DC | PRN
  Administered 2024-01-10: 165 ug/kg/min via INTRAVENOUS

## 2024-01-10 MED ORDER — PROPOFOL 10 MG/ML IV BOLUS
INTRAVENOUS | Status: DC | PRN
  Administered 2024-01-10: 50 mg via INTRAVENOUS
  Administered 2024-01-10: 10 mg via INTRAVENOUS

## 2024-01-10 MED ORDER — SODIUM CHLORIDE 0.9 % IV SOLN: INTRAVENOUS | Status: DC

## 2024-01-10 NOTE — Anesthesia Postprocedure Evaluation (Signed)
 Anesthesia Post Note  Patient: Bonnie Owens  Procedure(s) Performed: COLONOSCOPY WITH PROPOFOL   Patient location during evaluation: PACU Anesthesia Type: General Level of consciousness: awake and alert, oriented and patient cooperative Pain management: pain level controlled Vital Signs Assessment: post-procedure vital signs reviewed and stable Respiratory status: spontaneous breathing, nonlabored ventilation and respiratory function stable Cardiovascular status: blood pressure returned to baseline and stable Postop Assessment: adequate PO intake Anesthetic complications: no   No notable events documented.   Last Vitals:  Vitals:   01/10/24 0909 01/10/24 0919  BP: 112/86 110/86  Pulse:    Resp:    Temp:    SpO2:      Last Pain:  Vitals:   01/10/24 0919  TempSrc:   PainSc: 0-No pain                 Alfonso Ruths

## 2024-01-10 NOTE — H&P (Signed)
 Corinn JONELLE Brooklyn, MD [address]  [address]  [place], [place] [address]  Main: [phone]  Fax: [phone] Pager: [phone]  Primary Care Physician:  Sowles, Krichna, MD Primary Gastroenterologist:  Dr. Corinn JONELLE Brooklyn  Pre-Procedure History & Physical: HPI:  Bonnie Owens is a 73 y.o. female is here for an colonoscopy.   Past Medical History:  Diagnosis Date   Anemia    Anginal pain (HCC)    Chronic constipation    Complication of anesthesia    Bad headache when waking   Depression    Dyspnea    GERD (gastroesophageal reflux disease)    Glaucoma    Headache    occasional migraine   Hyperlipidemia    Left knee pain    Mitral valve prolapse    Osteopenia    Urine incontinence    Venous (peripheral) insufficiency    Vitamin D  deficiency     Past Surgical History:  Procedure Laterality Date   ABDOMINAL HYSTERECTOMY  1985   APPENDECTOMY  1971   BACK SURGERY     BASAL CELL CARCINOMA EXCISION Left 10/2016   Underneath Left Eye- [place] [facility]- Dr. Hester   BLADDER SUSPENSION     COLONOSCOPY N/A 11/14/2017   Procedure: COLONOSCOPY;  Surgeon: Toledo, Ladell POUR, MD;  Location: [facility] ENDOSCOPY;  Service: Endoscopy;  Laterality: N/A;   COLONOSCOPY WITH PROPOFOL  N/A 09/01/2019   Procedure: COLONOSCOPY WITH PROPOFOL ;  Surgeon: Gaylyn Gladis PENNER, MD;  Location: [facility];  Service: Endoscopy;  Laterality: N/A;   ESOPHAGOGASTRODUODENOSCOPY (EGD) WITH PROPOFOL  N/A 11/20/2018   Procedure: ESOPHAGOGASTRODUODENOSCOPY (EGD) WITH PROPOFOL ;  Surgeon: Toledo, Ladell POUR, MD;  Location: [facility] ENDOSCOPY;  Service: Gastroenterology;  Laterality: N/A;   JOINT REPLACEMENT Left    (LT) TKR 2017   LUMBAR DISC SURGERY  1995   sling procedure     TOTAL KNEE ARTHROPLASTY Left 03/09/2016   Procedure: TOTAL KNEE ARTHROPLASTY;  Surgeon: Ozell Flake, MD;  Location: [facility] ORS;  Service: Orthopedics;  Laterality: Left;   TOTAL KNEE REVISION Left 01/05/2022   Procedure: Left knee  revision, femoral component and tibial insert;  Surgeon: Flake Ozell, MD;  Location: [facility] ORS;  Service: Orthopedics;  Laterality: Left;   TUBAL LIGATION  1980   tummy tuck      Prior to Admission medications   Medication Sig Start Date End Date Taking? Authorizing Provider  Ascorbic Acid  (VITAMIN C  PO) Take 1,000 mg by mouth daily.    [provider]  Aspirin  81 MG CAPS Take 81 mg by mouth daily.    [provider]  calcium  carbonate (TUMS - DOSED IN MG ELEMENTAL CALCIUM ) 500 MG chewable tablet Chew 1 tablet by mouth daily as needed for indigestion or heartburn.    [provider]  Cholecalciferol  (VITAMIN D -3) 125 MCG (5000 UT) TABS Take 5,000 Units by mouth daily.    [provider]  denosumab (PROLIA) 60 MG/ML SOSY injection Inject 60 mg into the skin every 6 (six) months. 09/12/23   [provider]  diltiazem  (CARDIZEM  CD) 120 MG 24 hr capsule Take 1 capsule (120 mg total) by mouth daily. 11/21/17   Sowles, Krichna, MD  latanoprost  (XALATAN ) 0.005 % ophthalmic solution Place 1 drop into both eyes at bedtime. 06/01/20   [provider]  Misc Natural Products (OSTEO BI-FLEX JOINT SHIELD PO) Take 1 tablet by mouth daily.    [provider]  polyethylene glycol powder (GLYCOLAX /MIRALAX ) 17 GM/SCOOP powder Take 17 g by mouth daily. 08/03/22  Sowles, Krichna, MD  SUMAtriptan  (IMITREX ) 100 MG tablet Take 1 tablet (100 mg total) by mouth as needed. 02/27/23   Sowles, Krichna, MD  vitamin B-12 (CYANOCOBALAMIN ) 1000 MCG tablet Take 1,000 mcg by mouth daily.    [provider]    Allergies as of 12/10/2023   (No Known Allergies)    Family History  Problem Relation Age of Onset   Breast cancer Sister 50       twice, both breasts   Graves' disease Sister    Goiter Sister    Dementia Mother    Cancer Father        liver   Thyroid  disease Brother    Prostate cancer Brother    Breast cancer Maternal Grandmother        ?  age   Dysfunctional uterine bleeding Daughter    Dementia Maternal Grandfather    Cancer Paternal Grandmother     Social History   Socioeconomic History   Marital status: Married    Spouse name: Jerel    Number of children: 2   Years of education: Not on file   Highest education level: 12th grade  Occupational History   Occupation: engineer, materials. 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    Social Drivers of Corporate Investment Banker Strain: Low Risk  (02/09/2023)   Overall Financial Resource Strain (CARDIA)    Difficulty of Paying Living Expenses: Not hard at all  Food Insecurity: No Food Insecurity (02/09/2023)   Hunger Vital Sign    Worried About Running Out of Food in the Last Year: Never true    Ran Out of Food in the Last Year: Never true  Transportation Needs: No Transportation Needs (02/09/2023)   PRAPARE - Administrator, Civil Service (Medical): No    Lack of Transportation (Non-Medical): No  Physical Activity: Inactive (02/09/2023)   Exercise Vital Sign    Days of Exercise per Week: 0 days    Minutes of Exercise per Session: 0 min  Stress: No Stress Concern Present (02/09/2023)   [employer] of Occupational Health - Occupational Stress Questionnaire    Feeling of Stress : Not at all  Social Connections: Socially Integrated (02/09/2023)   Social Connection and Isolation Panel [NHANES]    Frequency of Communication with Friends and Family: More than three times a week    Frequency of Social Gatherings with Friends and Family: More than three times a week    Attends Religious  Services: More than 4 times per year    Active Member of [employer] or Organizations: Yes    Attends Banker Meetings: More than 4 times per year    Marital Status: Married  Catering Manager Violence: Not At Risk (02/09/2023)   Humiliation, Afraid, Rape, and Kick questionnaire    Fear of Current or Ex-Partner: No    Emotionally Abused: No    Physically Abused: No    Sexually Abused: No    Review of Systems: See HPI, otherwise negative  ROS  Physical Exam: BP 130/79   Pulse 67   Temp (!) 96.3 F (35.7 C) (Temporal)   Resp 16   Wt 61.4 kg   LMP  (LMP Unknown)   SpO2 98%   BMI 24.76 kg/m  General:   Alert,  pleasant and cooperative in NAD Head:  Normocephalic and atraumatic. Neck:  Supple; no masses or thyromegaly. Lungs:  Clear throughout to auscultation.    Heart:  Regular rate and rhythm. Abdomen:  Soft, nontender and nondistended. Normal bowel sounds, without guarding, and without rebound.   Neurologic:  Alert and  oriented x4;  grossly normal neurologically.  Impression/Plan: JHADE BERKO is here for an colonoscopy to be performed for RLQ pain, abnormal CT colon  Risks, benefits, limitations, and alternatives regarding  colonoscopy have been reviewed with the patient.  Questions have been answered.  All parties agreeable.   Corinn Brooklyn, MD  01/10/2024, 8:31 AM

## 2024-01-10 NOTE — Anesthesia Procedure Notes (Signed)
 Procedure Name: General with mask airway Date/Time: 01/10/2024 8:37 AM  Performed by: Ledora Duncan, CRNAPre-anesthesia Checklist: Patient identified, Emergency Drugs available, Suction available and Patient being monitored Patient Re-evaluated:Patient Re-evaluated prior to induction Oxygen Delivery Method: Simple face mask Induction Type: IV induction Placement Confirmation: positive ETCO2 and breath sounds checked- equal and bilateral Dental Injury: Teeth and Oropharynx as per pre-operative assessment

## 2024-01-10 NOTE — Transfer of Care (Signed)
 Immediate Anesthesia Transfer of Care Note  Patient: Bonnie Owens  Procedure(s) Performed: COLONOSCOPY WITH PROPOFOL   Patient Location: Endoscopy Unit  Anesthesia Type:General  Level of Consciousness: drowsy and patient cooperative  Airway & Oxygen Therapy: Patient Spontanous Breathing and Patient connected to face mask oxygen  Post-op Assessment: Report given to RN and Post -op Vital signs reviewed and stable  Post vital signs: Reviewed and stable  Last Vitals:  Vitals Value Taken Time  BP 92/52 01/10/24 0901  Temp 36.1 C 01/10/24 0859  Pulse 77 01/10/24 0904  Resp 14 01/10/24 0904  SpO2 100 % 01/10/24 0904  Vitals shown include unfiled device data.  Last Pain:  Vitals:   01/10/24 0859  TempSrc: Temporal  PainSc: Asleep         Complications: No notable events documented.

## 2024-01-10 NOTE — Anesthesia Preprocedure Evaluation (Addendum)
 Anesthesia Evaluation  Patient identified by MRN, date of birth, ID band Patient awake    Reviewed: Allergy & Precautions, NPO status , Patient's Chart, lab work & pertinent test results  History of Anesthesia Complications Negative for: history of anesthetic complications  Airway Mallampati: I   Neck ROM: Full    Dental no notable dental hx.    Pulmonary neg pulmonary ROS   Pulmonary exam normal breath sounds clear to auscultation       Cardiovascular Exercise Tolerance: Good Normal cardiovascular exam+ Valvular Problems/Murmurs MVP  Rhythm:Regular Rate:Normal  ECG 02/08/23:  Normal sinus rhythm  Possible Left atrial enlargement  Left axis deviation  Septal infarct (cited on or before 19-Mar-2017)  Abnormal ECG  When compared with ECG of 25-Oct-2018 10:59,  No significant change was found     Neuro/Psych  Headaches PSYCHIATRIC DISORDERS  Depression       GI/Hepatic hiatal hernia,GERD  ,,  Endo/Other  Hypothyroidism    Renal/GU negative Renal ROS     Musculoskeletal  (+) Arthritis ,    Abdominal   Peds  Hematology  (+) Blood dyscrasia, anemia   Anesthesia Other Findings   Reproductive/Obstetrics                             Anesthesia Physical Anesthesia Plan  ASA: 2  Anesthesia Plan: General   Post-op Pain Management:    Induction: Intravenous  PONV Risk Score and Plan: 3 and Propofol  infusion, TIVA and Treatment may vary due to age or medical condition  Airway Management Planned: Natural Airway  Additional Equipment:   Intra-op Plan:   Post-operative Plan:   Informed Consent: I have reviewed the patients History and Physical, chart, labs and discussed the procedure including the risks, benefits and alternatives for the proposed anesthesia with the patient or authorized representative who has indicated his/her understanding and acceptance.       Plan Discussed  with: CRNA  Anesthesia Plan Comments: (LMA/GETA backup discussed.  Patient consented for risks of anesthesia including but not limited to:  - adverse reactions to medications - damage to eyes, teeth, lips or other oral mucosa - nerve damage due to positioning  - sore throat or hoarseness - damage to heart, brain, nerves, lungs, other parts of body or loss of life  Informed patient about role of CRNA in peri- and intra-operative care.  Patient voiced understanding.)        Anesthesia Quick Evaluation

## 2024-01-10 NOTE — Op Note (Signed)
 [facility] Gastroenterology Patient Name: Bonnie Owens Procedure Date: 01/10/2024 8:30 AM MRN: [id number] Account #: [id number] Date of Birth: 1951/09/28 Admit Type: Outpatient Age: 73 Room: [facility] ENDO ROOM 2 Gender: Female Note Status: Finalized Instrument Name: Peds Colonoscope [id number] Procedure:             Colonoscopy Indications:           Abdominal pain in the right lower quadrant, Abnormal                         CT of the GI tract Providers:             Corinn Jess Brooklyn MD, MD Referring MD:          Dorette FALCON. Sowles, MD (Referring MD) Medicines:             General Anesthesia Complications:         No immediate complications. Estimated blood loss: None. Procedure:             Pre-Anesthesia Assessment:                        - Prior to the procedure, a History and Physical was                         performed, and patient medications and allergies were                         reviewed. The patient is competent. The risks and                         benefits of the procedure and the sedation options and                         risks were discussed with the patient. All questions                         were answered and informed consent was obtained.                         Patient identification and proposed procedure were                         verified by the physician, the nurse, the                         anesthesiologist, the anesthetist and the technician                         in the pre-procedure area in the procedure room in the                         endoscopy suite. Mental Status Examination: alert and                         oriented. Airway Examination: normal oropharyngeal                         airway and neck mobility. Respiratory Examination:  clear to auscultation. CV Examination: normal.                         Prophylactic Antibiotics: The patient does not require                         prophylactic  antibiotics. Prior Anticoagulants: The                         patient has taken no anticoagulant or antiplatelet                         agents. ASA Grade Assessment: II - A patient with mild                         systemic disease. After reviewing the risks and                         benefits, the patient was deemed in satisfactory                         condition to undergo the procedure. The anesthesia                         plan was to use general anesthesia. Immediately prior                         to administration of medications, the patient was                         re-assessed for adequacy to receive sedatives. The                         heart rate, respiratory rate, oxygen saturations,                         blood pressure, adequacy of pulmonary ventilation, and                         response to care were monitored throughout the                         procedure. The physical status of the patient was                         re-assessed after the procedure.                        After obtaining informed consent, the colonoscope was                         passed under direct vision. Throughout the procedure,                         the patient's blood pressure, pulse, and oxygen                         saturations were monitored continuously. The  Colonoscope was introduced through the anus and                         advanced to the 10 cm into the ileum. The colonoscopy                         was performed with moderate difficulty due to multiple                         diverticula in the colon. Successful completion of the                         procedure was aided by applying abdominal pressure.                         The patient tolerated the procedure well. The quality                         of the bowel preparation was evaluated using the BBPS                         [facility] Bowel Preparation Scale) with scores of: Right                          Colon = 3, Transverse Colon = 3 and Left Colon = 3                         (entire mucosa seen well with no residual staining,                         small fragments of stool or opaque liquid). The total                         BBPS score equals 9. The terminal ileum, ileocecal                         valve, appendiceal orifice, and rectum were                         photographed. Findings:      The perianal and digital rectal examinations were normal. Pertinent       negatives include normal sphincter tone and no palpable rectal lesions.      The terminal ileum appeared normal.      Multiple diverticula were found in the recto-sigmoid colon and sigmoid       colon.      The exam was otherwise without abnormality. Impression:            - The examined portion of the ileum was normal.                        - Diverticulosis in the recto-sigmoid colon and in the                         sigmoid colon.                        - The  examination was otherwise normal.                        - No specimens collected. Recommendation:        - Discharge patient to home (with escort).                        - Resume previous diet today.                        - Continue present medications. Procedure Code(s):     --- Professional ---                        [id number], Colonoscopy, flexible; diagnostic, including                         collection of specimen(s) by brushing or washing, when                         performed (separate procedure) Diagnosis Code(s):     --- Professional ---                        R10.31, Right lower quadrant pain                        K57.30, Diverticulosis of large intestine without                         perforation or abscess without bleeding                        R93.3, Abnormal findings on diagnostic imaging of                         other parts of digestive tract CPT copyright 2022 American Medical Association. All rights reserved. The codes  documented in this report are preliminary and upon coder review may  be revised to meet current compliance requirements. Dr. Angelita Brooklyn Corinn Jess Brooklyn MD, MD 01/10/2024 9:00:01 AM This report has been signed electronically. Number of Addenda: 0 Note Initiated On: 01/10/2024 8:30 AM Scope Withdrawal Time: 0 hours 9 minutes 13 seconds  Total Procedure Duration: 0 hours 17 minutes 2 seconds  Estimated Blood Loss:  Estimated blood loss: none.      [facility]

## 2024-01-11 ENCOUNTER — Encounter: Payer: Self-pay | Admitting: Gastroenterology

## 2024-02-14 ENCOUNTER — Ambulatory Visit: Payer: Medicare HMO

## 2024-02-14 DIAGNOSIS — Z0000 Encounter for general adult medical examination without abnormal findings: Secondary | ICD-10-CM

## 2024-02-14 NOTE — Patient Instructions (Addendum)
 Bonnie Owens , Thank you for taking time to come for your Medicare Wellness Visit. I appreciate your ongoing commitment to your health goals. Please review the following plan we discussed and let me know if I can assist you in the future.   Referrals/Orders/Follow-Ups/Clinician Recommendations: NONE  This is a list of the screening recommended for you and due dates:  Health Maintenance  Topic Date Due   DTaP/Tdap/Td vaccine (2 - Td or Tdap) 05/16/2020   COVID-19 Vaccine (4 - 2024-25 season) 08/05/2023   DEXA scan (bone density measurement)  09/13/2024   Mammogram  09/17/2024   Medicare Annual Wellness Visit  02/13/2025   Colon Cancer Screening  01/09/2034   Pneumonia Vaccine  Completed   Flu Shot  Completed   Hepatitis C Screening  Completed   Zoster (Shingles) Vaccine  Completed   HPV Vaccine  Aged Out    Advanced directives: (In Chart) A copy of your advanced directives are scanned into your chart should your provider ever need it.  Next Medicare Annual Wellness Visit scheduled for next year: Yes   02/19/25 @ 11:30 AM BY PHONE

## 2024-02-14 NOTE — Progress Notes (Signed)
 Subjective:   Bonnie Owens is a 73 y.o. who presents for a Medicare Wellness preventive visit.  Visit Complete: Virtual I connected with  Bonnie Owens on 02/14/24 by a audio enabled telemedicine application and verified that I am speaking with the correct person using two identifiers.  Patient Location: Home  Provider Location: Office/Clinic  I discussed the limitations of evaluation and management by telemedicine. The patient expressed understanding and agreed to proceed.  Vital Signs: Because this visit was a virtual/telehealth visit, some criteria may be missing or patient reported. Any vitals not documented were not able to be obtained and vitals that have been documented are patient reported.  VideoDeclined- This patient declined Librarian, academic. Therefore the visit was completed with audio only.  Persons Participating in Visit: Patient.  AWV Questionnaire: No: Patient Medicare AWV questionnaire was not completed prior to this visit.  Cardiac Risk Factors include: advanced age (>33men, >46 women);dyslipidemia     Objective:    There were no vitals filed for this visit. There is no height or weight on file to calculate BMI.     02/14/2024   11:35 AM 02/09/2023    9:31 AM 02/02/2022    3:01 PM 01/05/2022    6:00 PM 12/27/2021    9:44 AM 01/25/2021    9:34 AM 12/02/2019    9:42 AM  Advanced Directives  Does Patient Have a Medical Advance Directive? Yes Yes Yes Yes Yes Yes Yes  Type of Estate agent of [place];Living will  Healthcare Power of [place];Living will Healthcare Power of [place];Living will Healthcare Power of [place];Living will Healthcare Power of [place];Living will Healthcare Power of [place];Living will  Does patient want to make changes to medical advance directive? No - Patient declined   No - Patient declined     Copy of Healthcare Power of Attorney in Chart? Yes - validated most recent copy  scanned in chart (See row information)  Yes - validated most recent copy scanned in chart (See row information)  Yes - validated most recent copy scanned in chart (See row information) Yes - validated most recent copy scanned in chart (See row information) Yes - validated most recent copy scanned in chart (See row information)    Current Medications (verified) Outpatient Encounter Medications as of 02/14/2024  Medication Sig   Ascorbic Acid (VITAMIN C PO) Take 1,000 mg by mouth daily.   Aspirin 81 MG CAPS Take 81 mg by mouth daily.   calcium carbonate (TUMS - DOSED IN MG ELEMENTAL CALCIUM) 500 MG chewable tablet Chew 1 tablet by mouth daily as needed for indigestion or heartburn.   Cholecalciferol (VITAMIN D-3) 125 MCG (5000 UT) TABS Take 5,000 Units by mouth daily.   denosumab (PROLIA) 60 MG/ML SOSY injection Inject 60 mg into the skin every 6 (six) months.   diltiazem (CARDIZEM CD) 120 MG 24 hr capsule Take 1 capsule (120 mg total) by mouth daily.   latanoprost (XALATAN) 0.005 % ophthalmic solution Place 1 drop into both eyes at bedtime.   Misc Natural Products (OSTEO BI-FLEX JOINT SHIELD PO) Take 1 tablet by mouth daily.   polyethylene glycol powder (GLYCOLAX/MIRALAX) 17 GM/SCOOP powder Take 17 g by mouth daily.   SUMAtriptan (IMITREX) 100 MG tablet Take 1 tablet (100 mg total) by mouth as needed.   vitamin B-12 (CYANOCOBALAMIN) 1000 MCG tablet Take 1,000 mcg by mouth daily.   No facility-administered encounter medications on file as of 02/14/2024.    Allergies (verified)  Patient has no known allergies.   History: Past Medical History:  Diagnosis Date   Anemia    Anginal pain (HCC)    Chronic constipation    Complication of anesthesia    Bad headache when waking   Depression    Dyspnea    GERD (gastroesophageal reflux disease)    Glaucoma    Headache    occasional migraine   Hyperlipidemia    Left knee pain    Mitral valve prolapse    Osteopenia    Urine incontinence     Venous (peripheral) insufficiency    Vitamin D deficiency    Past Surgical History:  Procedure Laterality Date   ABDOMINAL HYSTERECTOMY  1985   APPENDECTOMY  1971   BACK SURGERY     BASAL CELL CARCINOMA EXCISION Left 10/2016   Underneath Left Eye- [place] [facility]- Dr. Gwen Pounds   BLADDER SUSPENSION     COLONOSCOPY N/A 11/14/2017   Procedure: COLONOSCOPY;  Surgeon: Toledo, Boykin Nearing, MD;  Location: [facility] ENDOSCOPY;  Service: Endoscopy;  Laterality: N/A;   COLONOSCOPY WITH PROPOFOL N/A 09/01/2019   Procedure: COLONOSCOPY WITH PROPOFOL;  Surgeon: Christena Deem, MD;  Location: [facility] ENDOSCOPY;  Service: Endoscopy;  Laterality: N/A;   COLONOSCOPY WITH PROPOFOL N/A 01/10/2024   Procedure: COLONOSCOPY WITH PROPOFOL;  Surgeon: Toney Reil, MD;  Location: [facility] ENDOSCOPY;  Service: Gastroenterology;  Laterality: N/A;   ESOPHAGOGASTRODUODENOSCOPY (EGD) WITH PROPOFOL N/A 11/20/2018   Procedure: ESOPHAGOGASTRODUODENOSCOPY (EGD) WITH PROPOFOL;  Surgeon: Toledo, Boykin Nearing, MD;  Location: [facility] ENDOSCOPY;  Service: Gastroenterology;  Laterality: N/A;   JOINT REPLACEMENT Left    (LT) TKR 2017   LUMBAR DISC SURGERY  1995   sling procedure     TOTAL KNEE ARTHROPLASTY Left 03/09/2016   Procedure: TOTAL KNEE ARTHROPLASTY;  Surgeon: Kennedy Bucker, MD;  Location: [facility] ORS;  Service: Orthopedics;  Laterality: Left;   TOTAL KNEE REVISION Left 01/05/2022   Procedure: Left knee revision, femoral component and tibial insert;  Surgeon: Kennedy Bucker, MD;  Location: [facility] ORS;  Service: Orthopedics;  Laterality: Left;   TUBAL LIGATION  1980   tummy tuck     Family History  Problem Relation Age of Onset   Breast cancer Sister 46       twice, both breasts   Graves' disease Sister    Goiter Sister    Dementia Mother    Cancer Father        liver   Thyroid disease Brother    Prostate cancer Brother    Breast cancer Maternal Grandmother        ? age   Dysfunctional uterine bleeding Daughter    Dementia  Maternal Grandfather    Cancer Paternal Grandmother    Social History   Socioeconomic History   Marital status: Married    Spouse name: Aurther Loft    Number of children: 2   Years of education: Not on file   Highest education level: 12th grade  Occupational History   Occupation: Engineer, materials. 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    Social Drivers of Corporate investment banker Strain: Low Risk  (02/14/2024)   Overall Financial Resource Strain (CARDIA)    Difficulty of Paying Living Expenses: Not hard at all  Food Insecurity: No Food Insecurity (02/14/2024)   Hunger Vital Sign    Worried About Running Out of Food in the Last Year: Never true    Ran Out of Food in the Last Year: Never true  Transportation Needs: No Transportation Needs (02/14/2024)   PRAPARE - Administrator, Civil Service (Medical): No    Lack of Transportation (Non-Medical): No  Physical Activity: Insufficiently Active (02/14/2024)   Exercise Vital Sign    Days of Exercise per Week: 3 days    Minutes of Exercise per Session: 30 min  Stress: No Stress Concern Present (02/14/2024)   [employer] of Occupational Health - Occupational Stress Questionnaire    Feeling of Stress : Not at all  Social Connections: Socially Integrated (02/14/2024)   Social Connection and Isolation Panel [NHANES]    Frequency of Communication with Friends and Family: More than three times a week    Frequency of Social Gatherings with Friends and Family: Twice a week    Attends Religious Services: More than 4 times per year    Active Member of [employer]  or Organizations: Yes    Attends Engineer, structural: More than 4 times per year    Marital Status: Married    Tobacco Counseling Counseling given: Not Answered    Clinical Intake:  Pre-visit preparation completed: Yes  Pain : No/denies pain     BMI - recorded: 25.6 Nutritional Status: BMI 25 -29 Overweight Nutritional Risks: None Diabetes: No  How often do you need to have someone help you when you read instructions, pamphlets, or other written materials from your doctor or pharmacy?: 1 - Never  Interpreter Needed?: No  Information entered by :: Kennedy Bucker, LPN   Activities of Daily Living     02/14/2024   11:36 AM 09/10/2023    8:36 AM  In your present state of health, do you have any difficulty performing the following activities:  Hearing? 1 1  Vision? 0 0  Difficulty concentrating or making decisions? 0 0  Walking or climbing stairs? 0 0  Dressing or bathing? 0 0  Doing errands, shopping? 0 0  Preparing Food and eating ? N   Using the Toilet? N   In the past six months, have you accidently leaked urine? N   Do you have problems with loss of bowel control? N   Managing your Medications? N   Managing your Finances? N   Housekeeping or managing your Housekeeping? N     Patient Care Team: Alba Cory, MD as PCP - General (Family Medicine) Lady Gary Darlin Priestly, MD as Consulting Physician (Cardiology) Stanton Kidney, MD as Consulting Physician (Gastroenterology) Kennedy Bucker, MD as Consulting Physician (Orthopedic Surgery) Domingo Madeira, OD (Optometry)  Indicate any recent Medical Services you may have received from other than [facility] providers in the past year (date may be approximate).     Assessment:   This is a routine wellness examination for Garn[place].  Hearing/Vision screen Hearing Screening - Comments:: WEARS AIDS, BOTH EARS Vision Screening - Comments:: WEARS CONTACTS- DR.NICE   Goals Addressed             This Visit's  Progress    DIET - EAT MORE FRUITS AND VEGETABLES  Depression Screen     02/14/2024   11:34 AM 09/10/2023    8:36 AM 02/27/2023    8:47 AM 02/09/2023    9:21 AM 08/03/2022   10:40 AM 02/02/2022    3:00 PM 06/21/2021    9:17 AM  PHQ 2/9 Scores  PHQ - 2 Score 0 0 0 0 0 0 0  PHQ- 9 Score 0 0 0  0      Fall Risk     02/14/2024   11:36 AM 09/10/2023    8:36 AM 02/27/2023    8:46 AM 02/09/2023    9:17 AM 08/03/2022   10:40 AM  Fall Risk   Falls in the past year? 0 0 0 0 0  Number falls in past yr: 0 0  0 0  Injury with Fall? 0 0  0 0  Risk for fall due to : No Fall Risks No Fall Risks No Fall Risks No Fall Risks No Fall Risks  Follow up Falls prevention discussed;Falls evaluation completed Falls prevention discussed Falls prevention discussed;Education provided;Falls evaluation completed Falls prevention discussed;Education provided Falls prevention discussed    MEDICARE RISK AT HOME:  Medicare Risk at Home Any stairs in or around the home?: Yes If so, are there any without handrails?: No Home free of loose throw rugs in walkways, pet beds, electrical cords, etc?: Yes Adequate lighting in your home to reduce risk of falls?: Yes Life alert?: No Use of a cane, walker or w/c?: No Grab bars in the bathroom?: No Shower chair or bench in shower?: No Elevated toilet seat or a handicapped toilet?: No  TIMED UP AND GO:  Was the test performed?  No  Cognitive Function: 6CIT completed        02/14/2024   11:38 AM 02/09/2023    9:34 AM 11/29/2018   10:11 AM  6CIT Screen  What Year? 0 points 0 points 0 points  What month? 0 points 0 points 0 points  What time? 0 points 0 points 0 points  Count back from 20 0 points 0 points 0 points  Months in reverse 0 points 2 points 2 points  Repeat phrase 0 points 2 points 2 points  Total Score 0 points 4 points 4 points    Immunizations Immunization History  Administered Date(s) Administered   Fluad Quad(high Dose 65+) 08/18/2019,  01/21/2021, 11/03/2022   Fluad Trivalent(High Dose 65+) 09/10/2023   Influenza Split 10/03/2012   Influenza, High Dose Seasonal PF 08/16/2018   Influenza, Seasonal, Injecte, Preservative Fre 10/02/2011   Influenza,inj,Quad PF,6+ Mos 08/24/2014, 09/16/2015, 09/29/2016   Influenza-Unspecified 08/24/2014, 09/14/2017   PFIZER(Purple Top)SARS-COV-2 Vaccination 01/15/2020, 02/05/2020, 08/17/2020   Pneumococcal Conjugate-13 11/20/2016   Pneumococcal Polysaccharide-23 04/14/2015   Tdap 05/16/2010   Zoster Recombinant(Shingrix) 09/14/2017, 12/13/2017   Zoster, Live 10/02/2011    Screening Tests Health Maintenance  Topic Date Due   DTaP/Tdap/Td (2 - Td or Tdap) 05/16/2020   COVID-19 Vaccine (4 - 2024-25 season) 08/05/2023   DEXA SCAN  09/13/2024   MAMMOGRAM  09/17/2024   Medicare Annual Wellness (AWV)  02/13/2025   Colonoscopy  01/09/2034   Pneumonia Vaccine 51+ Years old  Completed   INFLUENZA VACCINE  Completed   Hepatitis C Screening  Completed   Zoster Vaccines- Shingrix  Completed   HPV VACCINES  Aged Out    Health Maintenance  Health Maintenance Due  Topic Date Due   DTaP/Tdap/Td (2 - Td or Tdap) 05/16/2020   COVID-19 Vaccine (4 - 2024-25 season) 08/05/2023  Health Maintenance Items Addressed: CURRENTLY UP TO DATE  Additional Screening:  Vision Screening: Recommended annual ophthalmology exams for early detection of glaucoma and other disorders of the eye.  Dental Screening: Recommended annual dental exams for proper oral hygiene  Community Resource Referral / Chronic Care Management: CRR required this visit?  No   CCM required this visit?  No     Plan:     I have personally reviewed and noted the following in the patient's chart:   Medical and social history Use of alcohol, tobacco or illicit drugs  Current medications and supplements including opioid prescriptions. Patient is not currently taking opioid prescriptions. Functional ability and  status Nutritional status Physical activity Advanced directives List of other physicians Hospitalizations, surgeries, and ER visits in previous 12 months Vitals Screenings to include cognitive, depression, and falls Referrals and appointments  In addition, I have reviewed and discussed with patient certain preventive protocols, quality metrics, and best practice recommendations. A written personalized care plan for preventive services as well as general preventive health recommendations were provided to patient.     Hal Hope, LPN   1/61/0960   After Visit Summary: (MyChart) Due to this being a telephonic visit, the after visit summary with patients personalized plan was offered to patient via MyChart   Notes: Nothing significant to report at this time.

## 2024-03-11 ENCOUNTER — Encounter: Payer: Self-pay | Admitting: Family Medicine

## 2024-03-11 ENCOUNTER — Ambulatory Visit (INDEPENDENT_AMBULATORY_CARE_PROVIDER_SITE_OTHER): Payer: Self-pay | Admitting: Family Medicine

## 2024-03-11 VITALS — BP 116/72 | HR 94 | Resp 16 | Ht 62.0 in | Wt 139.5 lb

## 2024-03-11 DIAGNOSIS — I700 Atherosclerosis of aorta: Secondary | ICD-10-CM | POA: Diagnosis not present

## 2024-03-11 DIAGNOSIS — I341 Nonrheumatic mitral (valve) prolapse: Secondary | ICD-10-CM

## 2024-03-11 DIAGNOSIS — G43009 Migraine without aura, not intractable, without status migrainosus: Secondary | ICD-10-CM

## 2024-03-11 DIAGNOSIS — E559 Vitamin D deficiency, unspecified: Secondary | ICD-10-CM

## 2024-03-11 DIAGNOSIS — M816 Localized osteoporosis [Lequesne]: Secondary | ICD-10-CM | POA: Diagnosis not present

## 2024-03-11 DIAGNOSIS — K219 Gastro-esophageal reflux disease without esophagitis: Secondary | ICD-10-CM | POA: Diagnosis not present

## 2024-03-11 DIAGNOSIS — Z1231 Encounter for screening mammogram for malignant neoplasm of breast: Secondary | ICD-10-CM

## 2024-03-11 DIAGNOSIS — Z79899 Other long term (current) drug therapy: Secondary | ICD-10-CM

## 2024-03-11 DIAGNOSIS — Z974 Presence of external hearing-aid: Secondary | ICD-10-CM

## 2024-03-11 MED ORDER — SUMATRIPTAN SUCCINATE 50 MG PO TABS: 50.0000 mg | ORAL_TABLET | ORAL | 1 refills | Status: AC | PRN

## 2024-03-11 NOTE — Progress Notes (Signed)
 Name: Bonnie Owens   MRN: [id number]    DOB: [birth year]   Date:03/11/2024       Progress Note  Subjective  Chief Complaint  Chief Complaint  Patient presents with   Medical Management of Chronic Issues   Discussed the use of AI scribe software for clinical note transcription with the patient, who gave verbal consent to proceed.  History of Present Illness Bonnie Owens is a 73 year old female who presents for a six-month follow-up for bowel incontinence.  She has a history of bowel incontinence characterized by right lower quadrant fullness and difficulty holding stools. A CT scan of the abdomen and pelvis suggested possible inflammatory bowel disease and diverticulosis of the sigmoid colon, but a subsequent colonoscopy showed normal findings. She continues to experience urgency but has not had any recent incidents of incontinence. She is currently using Miralax to manage her symptoms. No blood in stools or significant weight loss.  Five days ago, she experienced an accident where a globe fell on her forehead while changing the bulb , she was standing on a step stool, resulting in a bruise on her forehead and now the bruise is under the  her right eye. She applied ice immediately and reports soreness on the top of her eyebrow, but no vision changes or significant injury.  She has  osteoporosis, particularly in the left arm, and has been receiving Prolia injections for treatment. She previously used Boniva, which was ineffective. Her bone density has been monitored since 2016, showing slow progression. She is also taking vitamin D supplements at a dose of 2000 IU daily.   She has a history of aortic atherosclerosis  noted on a CT scan abdomen done Fall 2024. She manages her cholesterol through diet and takes an 81 mg aspirin daily. Her cholesterol levels were last checked in August 2023, showing an LDL of 103 mg/dL and an HDL of 50 mg/dL. No history of coronary artery disease or  angina.  She experiences migraines, typically starting with a sensation on top or  right eye, and uses sumatriptan as needed, taking half a dose to avoid side effects. She reports having about two migraines per month.  She has a history of mitral valve prolapse and experiences palpitations, particularly when stressed or tired, but these resolve quickly without shortness of breath. She takes cardizem CD and sees cardiologist   She is managing her weight through Weight [employer] and has lost approximately 10 pounds over the past year.    Patient Active Problem List   Diagnosis Date Noted   Abnormal CT scan, colon 01/10/2024   Hearing aid worn 09/10/2023   Status post revision of total replacement of left knee 01/05/2022   Hiatal hernia 11/26/2018   Palpitations 03/19/2017   History of basal cell carcinoma 11/20/2016   Primary osteoarthritis of left knee 03/09/2016   History of total knee replacement, left 03/09/2016   Osteopenia 11/17/2015   Migraine without aura and without status migrainosus, not intractable 11/17/2015   Urge urinary incontinence 11/17/2015   Dyslipidemia 11/17/2015   Large breasts 11/17/2015   Menopause 11/17/2015   Vitamin D deficiency 11/17/2015   MVP (mitral valve prolapse) 11/17/2015   History of lumbar fusion 11/17/2015   Chronic constipation 11/17/2015   Venous insufficiency 11/17/2015   GERD (gastroesophageal reflux disease) 11/17/2015   Eustachian tube dysfunction 11/17/2015   Sciatica of left side     Past Surgical History:  Procedure Laterality Date   ABDOMINAL HYSTERECTOMY  1985  APPENDECTOMY  1971   BACK SURGERY     BASAL CELL CARCINOMA EXCISION Left 10/2016   Underneath Left Eye- [facility]- Dr. Gwen Pounds   BLADDER SUSPENSION     COLONOSCOPY N/A 11/14/2017   Procedure: COLONOSCOPY;  Surgeon: Toledo, Boykin Nearing, MD;  Location: [facility] ENDOSCOPY;  Service: Endoscopy;  Laterality: N/A;   COLONOSCOPY WITH PROPOFOL N/A 09/01/2019   Procedure:  COLONOSCOPY WITH PROPOFOL;  Surgeon: Christena Deem, MD;  Location[facility] ENDOSCOPY;  Service: Endoscopy;  Laterality: N/A;   COLONOSCOPY WITH PROPOFOL N/A 01/10/2024   Procedure: COLONOSCOPY WITH PROPOFOL;  Surgeon: Toney Reil, MD;  Location: [facility] ENDOSCOPY;  Service: Gastroenterology;  Laterality: N/A;   ESOPHAGOGASTRODUODENOSCOPY (EGD) WITH PROPOFOL N/A 11/20/2018   Procedure: ESOPHAGOGASTRODUODENOSCOPY (EGD) WITH PROPOFOL;  Surgeon: Toledo, Boykin Nearing, MD;  Location: [facility] ENDOSCOPY;  Service: Gastroenterology;  Laterality: N/A;   JOINT REPLACEMENT Left    (LT) TKR 2017   LUMBAR DISC SURGERY  1995   sling procedure     TOTAL KNEE ARTHROPLASTY Left 03/09/2016   Procedure: TOTAL KNEE ARTHROPLASTY;  Surgeon: Kennedy Bucker, MD;  Location: [facility] ORS;  Service: Orthopedics;  Laterality: Left;   TOTAL KNEE REVISION Left 01/05/2022   Procedure: Left knee revision, femoral component and tibial insert;  Surgeon: Kennedy Bucker, MD;  Location: [facility] ORS;  Service: Orthopedics;  Laterality: Left;   TUBAL LIGATION  1980   tummy tuck      Family History  Problem Relation Age of Onset   Breast cancer Sister 103       twice, both breasts   Graves' disease Sister    Goiter Sister    Dementia Mother    Cancer Father        liver   Thyroid disease Brother    Prostate cancer Brother    Breast cancer Maternal Grandmother        ? age   Dysfunctional uterine bleeding Daughter    Dementia Maternal Grandfather    Cancer Paternal Grandmother     Social History   Tobacco Use   Smoking status: Never   Smokeless tobacco: Never  Substance Use Topics   Alcohol use: Not Currently     Current Outpatient Medications:    Ascorbic Acid (VITAMIN C PO), Take 1,000 mg by mouth daily., Disp: , Rfl:    Aspirin 81 MG CAPS, Take 81 mg by mouth daily., Disp: , Rfl:    calcium carbonate (TUMS - DOSED IN MG ELEMENTAL CALCIUM) 500 MG chewable tablet, Chew 1 tablet by mouth daily as needed for indigestion or  heartburn., Disp: , Rfl:    Cholecalciferol (VITAMIN D-3) 125 MCG (5000 UT) TABS, Take 5,000 Units by mouth daily., Disp: , Rfl:    denosumab (PROLIA) 60 MG/ML SOSY injection, Inject 60 mg into the skin every 6 (six) months., Disp: , Rfl:    diltiazem (CARDIZEM CD) 120 MG 24 hr capsule, Take 1 capsule (120 mg total) by mouth daily., Disp: 30 capsule, Rfl: 12   latanoprost (XALATAN) 0.005 % ophthalmic solution, Place 1 drop into both eyes at bedtime., Disp: , Rfl:    Misc Natural Products (OSTEO BI-FLEX JOINT SHIELD PO), Take 1 tablet by mouth daily., Disp: , Rfl:    polyethylene glycol powder (GLYCOLAX/MIRALAX) 17 GM/SCOOP powder, Take 17 g by mouth daily., Disp: 850 g, Rfl: 1   SUMAtriptan (IMITREX) 100 MG tablet, Take 1 tablet (100 mg total) by mouth as needed., Disp: 27 tablet, Rfl: 0   vitamin B-12 (CYANOCOBALAMIN) 1000  MCG tablet, Take 1,000 mcg by mouth daily., Disp: , Rfl:   No Known Allergies  I personally reviewed active problem list, medication list, allergies, family history with the patient/caregiver today.   ROS  Ten systems reviewed and is negative except as mentioned in HPI    Objective Physical Exam MEASUREMENTS: Weight- 139.5, BMI- 25.0. CONSTITUTIONAL: Patient appears well-developed and well-nourished. No distress. HEENT: Head atraumatic, normocephalic, neck supple. CARDIOVASCULAR: Normal rate, regular rhythm and normal heart sounds. No murmur heard. No BLE edema. PULMONARY: Effort normal and breath sounds normal. No respiratory distress. ABDOMINAL: There is no tenderness or distention. MUSCULOSKELETAL: Normal gait. Without gross motor or sensory deficit. PSYCHIATRIC: Patient has a normal mood and affect. Behavior is normal. Judgment and thought content normal. NEUROLOGICAL: Extraocular movements intact.  Vitals:   03/11/24 0802  BP: 116/72  Pulse: 94  Resp: 16  SpO2: 98%  Weight: 139 lb 8 oz (63.3 kg)  Height: 5\' 2"  (1.575 m)    Body mass index is 25.51  kg/m.  PHQ2/9:    02/14/2024   11:34 AM 09/10/2023    8:36 AM 02/27/2023    8:47 AM 02/09/2023    9:21 AM 08/03/2022   10:40 AM  Depression screen PHQ 2/9  Decreased Interest 0 0 0 0 0  Down, Depressed, Hopeless 0 0 0 0 0  PHQ - 2 Score 0 0 0 0 0  Altered sleeping 0 0 0  0  Tired, decreased energy 0 0 0  0  Change in appetite 0 0 0  0  Feeling bad or failure about yourself  0 0 0  0  Trouble concentrating 0 0 0  0  Moving slowly or fidgety/restless 0 0 0  0  Suicidal thoughts 0 0 0  0  PHQ-9 Score 0 0 0  0  Difficult doing work/chores Not difficult at all        phq 9 is negative  Fall Risk:    02/14/2024   11:36 AM 09/10/2023    8:36 AM 02/27/2023    8:46 AM 02/09/2023    9:17 AM 08/03/2022   10:40 AM  Fall Risk   Falls in the past year? 0 0 0 0 0  Number falls in past yr: 0 0  0 0  Injury with Fall? 0 0  0 0  Risk for fall due to : No Fall Risks No Fall Risks No Fall Risks No Fall Risks No Fall Risks  Follow up Falls prevention discussed;Falls evaluation completed Falls prevention discussed Falls prevention discussed;Education provided;Falls evaluation completed Falls prevention discussed;Education provided Falls prevention discussed     Assessment & Plan   Assessment & Plan Facial contusion Sustained contusion on forehead and right orbital bone with periorbital ecchymosis. No vision changes or significant injury. Expected to resolve over time.  Bowel incontinence Improved symptoms with no recent incontinence, but urgency persists. CT scan indicated possible inflammatory bowel disease and diverticulosis. Normal colonoscopy. - Continue Miralax and high-fiber diet. - Consider repeat colonoscopy if symptoms worsen.  Aortic atherosclerosis Plaque formation on CT scan. No coronary artery disease or angina. Discussed cholesterol management and baby aspirin . Decision on medication based on family history and preference. - Continue lifestyle modifications for cholesterol  management. - Consider taking 81 mg baby aspirin daily.  Mitral valve prolapse Managed with diltiazem. Palpitations occur with stress or fatigue, resolving quickly without dyspnea. - Continue diltiazem.  Osteoporosis Osteoporosis in left arm, osteopenia in spine and femur. Receiving Prolia injections. Slow  progression since 2016. Discussed alendronate, awaiting bone density scan results. - Order bone density scan. - Continue Prolia injections. - Schedule mammogram in October.  Migraine Migraines twice a month with right eye prodrome. Manages with sumatriptan, considering lower dose to reduce side effects. - Prescribe 50 mg sumatriptan.  General Health Maintenance BMI healthy, managed with Weight Watchers. Plans to check blood work. Discussed bone health and cholesterol monitoring. - Order blood work including lipid profile, anemia, glucose, and vitamin D levels.

## 2024-03-12 ENCOUNTER — Encounter: Payer: Self-pay | Admitting: Family Medicine

## 2024-03-12 LAB — CBC WITH DIFFERENTIAL/PLATELET
Absolute Lymphocytes: 2227 {cells}/uL (ref 850–3900)
Absolute Monocytes: 516 {cells}/uL (ref 200–950)
Basophils Absolute: 29 {cells}/uL (ref 0–200)
Basophils Relative: 0.5 %
Eosinophils Absolute: 41 {cells}/uL (ref 15–500)
Eosinophils Relative: 0.7 %
HCT: 43.5 % (ref 35.0–45.0)
Hemoglobin: 14.8 g/dL (ref 11.7–15.5)
MCH: 32.5 pg (ref 27.0–33.0)
MCHC: 34 g/dL (ref 32.0–36.0)
MCV: 95.4 fL (ref 80.0–100.0)
MPV: 9 fL (ref 7.5–12.5)
Monocytes Relative: 8.9 %
Neutro Abs: 2987 {cells}/uL (ref 1500–7800)
Neutrophils Relative %: 51.5 %
Platelets: 338 10*3/uL (ref 140–400)
RBC: 4.56 10*6/uL (ref 3.80–5.10)
RDW: 11.9 % (ref 11.0–15.0)
Total Lymphocyte: 38.4 %
WBC: 5.8 10*3/uL (ref 3.8–10.8)

## 2024-03-12 LAB — COMPREHENSIVE METABOLIC PANEL WITH GFR
AG Ratio: 1.7 (calc) (ref 1.0–2.5)
ALT: 10 U/L (ref 6–29)
AST: 16 U/L (ref 10–35)
Albumin: 4.3 g/dL (ref 3.6–5.1)
Alkaline phosphatase (APISO): 40 U/L (ref 37–153)
BUN: 13 mg/dL (ref 7–25)
CO2: 33 mmol/L — ABNORMAL HIGH (ref 20–32)
Calcium: 9.7 mg/dL (ref 8.6–10.4)
Chloride: 102 mmol/L (ref 98–110)
Creat: 0.67 mg/dL (ref 0.60–1.00)
Globulin: 2.5 g/dL (ref 1.9–3.7)
Glucose, Bld: 89 mg/dL (ref 65–99)
Potassium: 5.4 mmol/L — ABNORMAL HIGH (ref 3.5–5.3)
Sodium: 138 mmol/L (ref 135–146)
Total Bilirubin: 0.5 mg/dL (ref 0.2–1.2)
Total Protein: 6.8 g/dL (ref 6.1–8.1)
eGFR: 92 mL/min/{1.73_m2} (ref >=60)

## 2024-03-12 LAB — LIPID PANEL
Cholesterol: 215 mg/dL — ABNORMAL HIGH (ref <200)
HDL: 56 mg/dL (ref >=50)
LDL Cholesterol (Calc): 135 mg/dL — ABNORMAL HIGH
Non-HDL Cholesterol (Calc): 159 mg/dL — ABNORMAL HIGH (ref <130)
Total CHOL/HDL Ratio: 3.8 (calc) (ref <5.0)
Triglycerides: 126 mg/dL (ref <150)

## 2024-03-12 LAB — VITAMIN D 25 HYDROXY (VIT D DEFICIENCY, FRACTURES): Vit D, 25-Hydroxy: 42 ng/mL (ref 30–100)

## 2024-04-12 ENCOUNTER — Other Ambulatory Visit: Payer: Self-pay | Admitting: Family Medicine

## 2024-04-12 DIAGNOSIS — G43009 Migraine without aura, not intractable, without status migrainosus: Secondary | ICD-10-CM

## 2024-08-26 DIAGNOSIS — I491 Atrial premature depolarization: Secondary | ICD-10-CM | POA: Insufficient documentation

## 2024-09-10 ENCOUNTER — Ambulatory Visit (INDEPENDENT_AMBULATORY_CARE_PROVIDER_SITE_OTHER): Admitting: Family Medicine

## 2024-09-10 ENCOUNTER — Encounter: Payer: Self-pay | Admitting: Family Medicine

## 2024-09-10 VITALS — BP 124/80 | HR 88 | Temp 97.7°F | Resp 18 | Ht 62.0 in | Wt 140.9 lb

## 2024-09-10 DIAGNOSIS — G43009 Migraine without aura, not intractable, without status migrainosus: Secondary | ICD-10-CM | POA: Diagnosis not present

## 2024-09-10 DIAGNOSIS — Z9181 History of falling: Secondary | ICD-10-CM

## 2024-09-10 DIAGNOSIS — Z23 Encounter for immunization: Secondary | ICD-10-CM

## 2024-09-10 DIAGNOSIS — K5909 Other constipation: Secondary | ICD-10-CM

## 2024-09-10 DIAGNOSIS — I700 Atherosclerosis of aorta: Secondary | ICD-10-CM | POA: Diagnosis not present

## 2024-09-10 DIAGNOSIS — M816 Localized osteoporosis [Lequesne]: Secondary | ICD-10-CM | POA: Diagnosis not present

## 2024-09-10 DIAGNOSIS — K219 Gastro-esophageal reflux disease without esophagitis: Secondary | ICD-10-CM

## 2024-09-10 DIAGNOSIS — J3089 Other allergic rhinitis: Secondary | ICD-10-CM

## 2024-09-10 DIAGNOSIS — I341 Nonrheumatic mitral (valve) prolapse: Secondary | ICD-10-CM

## 2024-09-10 DIAGNOSIS — R002 Palpitations: Secondary | ICD-10-CM | POA: Diagnosis not present

## 2024-09-10 DIAGNOSIS — E559 Vitamin D deficiency, unspecified: Secondary | ICD-10-CM

## 2024-09-10 MED ORDER — FLUTICASONE PROPIONATE 50 MCG/ACT NA SUSP: 2.0000 | Freq: Every day | NASAL | 1 refills | Status: AC

## 2024-09-10 MED ORDER — LEVOCETIRIZINE DIHYDROCHLORIDE 5 MG PO TABS: 5.0000 mg | ORAL_TABLET | Freq: Every evening | ORAL | 1 refills | Status: AC

## 2024-09-10 MED ORDER — POLYETHYLENE GLYCOL 3350 17 GM/SCOOP PO POWD: 17.0000 g | Freq: Every day | ORAL | 1 refills | Status: AC

## 2024-09-10 NOTE — Progress Notes (Signed)
 Name: Bonnie Owens   MRN: [id number]    DOB: 07-Aug-1951   Date:09/10/2024       Progress Note  Subjective  Chief Complaint  Chief Complaint  Patient presents with   Medical Management of Chronic Issues    6 month f /u   Migraine   Hypertension   Discussed the use of AI scribe software for clinical note transcription with the patient, who gave verbal consent to proceed.  History of Present Illness Bonnie Owens is a 73 year old female who presents for a six-month follow-up after a fall.  She experienced a fall after stepping off a sidewalk following a ball game, resulting in a sprain and a small fracture in her left ankle. She also fell on her left knee, which had been previously replaced, but an x-ray confirmed no fracture in the knee. The ankle was wrapped, and she was advised to stay off it for about two weeks. She experienced swelling and pain in the ankle, and also hit her face during the fall.  She has a history of osteoporosis, particularly affecting her left radius and spine, and is currently on Prolia injections every six months. Her last bone density scan was in October 2023, and she has another scheduled for October 2025. She tries to maintain a high calcium  diet and takes vitamin D  supplements. Her vitamin D  level increased from 28 to 42.  She experiences chronic palpitations and exertional dyspnea, with recent episodes of chest pain and shortness of breath. She underwent a Holter monitor test in August, which showed infrequent premature ventricular and atrial contractions. She recently had a stress test, with results pending. She takes Cardizem  CD 120 mg for migraine prevention and palpitations, but does not have a history of high blood pressure.  She reports a dry cough and throat clearing, which she attributes to post-nasal drip. This issue is seasonal, occurring more in spring and fall. She manages reflux symptoms with Tums as needed and adjusts her eating  habits to prevent symptoms.  She experiences migraines occasionally, with the last episode occurring the day before the visit. She uses Imitrex  as needed for migraine relief.  She has a history of IBS and manages bowel movements with Miralax , taking it three times a week to prevent constipation and incontinence. She had a colonoscopy in February 2025, and finds that adjusting the Miralax  dosage helps maintain regular bowel movements.    Patient Active Problem List   Diagnosis Date Noted   Atherosclerosis of aorta 09/10/2024   Premature atrial contractions 08/26/2024   Localized osteoporosis without current pathological fracture 03/11/2024   Abnormal CT scan, colon 01/10/2024   Hearing aid worn 09/10/2023   Status post revision of total replacement of left knee 01/05/2022   Hiatal hernia 11/26/2018   Palpitations 03/19/2017   History of basal cell carcinoma 11/20/2016   Primary osteoarthritis of left knee 03/09/2016   History of total knee replacement, left 03/09/2016   Migraine without aura and without status migrainosus, not intractable 11/17/2015   Urge urinary incontinence 11/17/2015   Dyslipidemia 11/17/2015   Large breasts 11/17/2015   Menopause 11/17/2015   Vitamin D  deficiency 11/17/2015   MVP (mitral valve prolapse) 11/17/2015   History of lumbar fusion 11/17/2015   Chronic constipation 11/17/2015   Venous insufficiency 11/17/2015   GERD (gastroesophageal reflux disease) 11/17/2015   Eustachian tube dysfunction 11/17/2015   Sciatica of left side     Past Surgical History:  Procedure Laterality Date  ABDOMINAL HYSTERECTOMY  1985   APPENDECTOMY  1971   BACK SURGERY     BASAL CELL CARCINOMA EXCISION Left 10/2016   Underneath Left Eye- [facility]- Dr. Hester   BLADDER SUSPENSION     COLONOSCOPY N/A 11/14/2017   Procedure: COLONOSCOPY;  Surgeon: Toledo, Ladell POUR, MD;  Location: [facility] ENDOSCOPY;  Service: Endoscopy;  Laterality: N/A;   COLONOSCOPY WITH PROPOFOL   N/A 09/01/2019   Procedure: COLONOSCOPY WITH PROPOFOL ;  Surgeon: Gaylyn Gladis PENNER, MD;  Location: [facility] ENDOSCOPY;  Service: Endoscopy;  Laterality: N/A;   COLONOSCOPY WITH PROPOFOL  N/A 01/10/2024   Procedure: COLONOSCOPY WITH PROPOFOL ;  Surgeon: Unk Corinn Skiff, MD;  Location: [facility] ENDOSCOPY;  Service: Gastroenterology;  Laterality: N/A;   ESOPHAGOGASTRODUODENOSCOPY (EGD) WITH PROPOFOL  N/A 11/20/2018   Procedure: ESOPHAGOGASTRODUODENOSCOPY (EGD) WITH PROPOFOL ;  Surgeon: Toledo, Ladell POUR, MD;  Location: [facility] ENDOSCOPY;  Service: Gastroenterology;  Laterality: N/A;   JOINT REPLACEMENT Left    (LT) TKR 2017   LUMBAR DISC SURGERY  1995   sling procedure     TOTAL KNEE ARTHROPLASTY Left 03/09/2016   Procedure: TOTAL KNEE ARTHROPLASTY;  Surgeon: Ozell Flake, MD;  Location: [facility] ORS;  Service: Orthopedics;  Laterality: Left;   TOTAL KNEE REVISION Left 01/05/2022   Procedure: Left knee revision, femoral component and tibial insert;  Surgeon: Flake Ozell, MD;  Location: [facility] ORS;  Service: Orthopedics;  Laterality: Left;   TUBAL LIGATION  1980   tummy tuck      Family History  Problem Relation Age of Onset   Breast cancer Sister 59       twice, both breasts   Graves' disease Sister    Goiter Sister    Dementia Mother    Cancer Father        liver   Thyroid  disease Brother    Prostate cancer Brother    Breast cancer Maternal Grandmother        ? age   Dysfunctional uterine bleeding Daughter    Dementia Maternal Grandfather    Cancer Paternal Grandmother     Social History   Tobacco Use   Smoking status: Never   Smokeless tobacco: Never  Substance Use Topics   Alcohol use: Not Currently     Current Outpatient Medications:    Ascorbic Acid  (VITAMIN C  PO), Take 1,000 mg by mouth daily., Disp: , Rfl:    Aspirin  81 MG CAPS, Take 81 mg by mouth daily., Disp: , Rfl:    calcium  carbonate (TUMS - DOSED IN MG ELEMENTAL CALCIUM ) 500 MG chewable tablet, Chew 1 tablet by mouth daily  as needed for indigestion or heartburn., Disp: , Rfl:    Cholecalciferol  (VITAMIN D -3) 125 MCG (5000 UT) TABS, Take 5,000 Units by mouth daily., Disp: , Rfl:    denosumab (PROLIA) 60 MG/ML SOSY injection, Inject 60 mg into the skin every 6 (six) months., Disp: , Rfl:    diltiazem  (CARDIZEM  CD) 120 MG 24 hr capsule, Take 1 capsule (120 mg total) by mouth daily., Disp: 30 capsule, Rfl: 12   latanoprost  (XALATAN ) 0.005 % ophthalmic solution, Place 1 drop into both eyes at bedtime., Disp: , Rfl:    Misc Natural Products (OSTEO BI-FLEX JOINT SHIELD PO), Take 1 tablet by mouth daily., Disp: , Rfl:    polyethylene glycol powder (GLYCOLAX /MIRALAX ) 17 GM/SCOOP powder, Take 17 g by mouth daily., Disp: 850 g, Rfl: 1   SUMAtriptan  (IMITREX ) 50 MG tablet, Take 1 tablet (50 mg total) by mouth as needed., Disp: 27 tablet, Rfl: 1  vitamin B-12 (CYANOCOBALAMIN ) 1000 MCG tablet, Take 1,000 mcg by mouth daily., Disp: , Rfl:   No Known Allergies  I personally reviewed active problem list, medication list, allergies with the patient/caregiver today.   ROS  Ten systems reviewed and is negative except as mentioned in HPI    Objective Physical Exam CONSTITUTIONAL: Patient appears well-developed and well-nourished.  No distress. HEENT: Head atraumatic, normocephalic, neck supple. CARDIOVASCULAR: Normal rate, regular rhythm and normal heart sounds.  No murmur heard. No BLE edema. PULMONARY: Effort normal and breath sounds normal. No respiratory distress. MUSCULOSKELETAL: Normal gait. Without gross motor or sensory deficit. PSYCHIATRIC: Patient has a normal mood and affect. behavior is normal. Judgment and thought content normal.  Vitals:   09/10/24 0814  BP: 124/80  Pulse: 88  Resp: 18  Temp: 97.7 F (36.5 C)  SpO2: 97%  Weight: 140 lb 14.4 oz (63.9 kg)  Height: 5' 2 (1.575 m)    Body mass index is 25.77 kg/m.    PHQ2/9:    09/10/2024    8:16 AM 02/14/2024   11:34 AM 09/10/2023    8:36 AM  02/27/2023    8:47 AM 02/09/2023    9:21 AM  Depression screen PHQ 2/9  Decreased Interest 0 0 0 0 0  Down, Depressed, Hopeless 0 0 0 0 0  PHQ - 2 Score 0 0 0 0 0  Altered sleeping 0 0 0 0   Tired, decreased energy 0 0 0 0   Change in appetite 0 0 0 0   Feeling bad or failure about yourself  0 0 0 0   Trouble concentrating 0 0 0 0   Moving slowly or fidgety/restless 0 0 0 0   Suicidal thoughts 0 0 0 0   PHQ-9 Score 0 0 0 0   Difficult doing work/chores Not difficult at all Not difficult at all       phq 9 is negative  Fall Risk:    09/10/2024    8:15 AM 02/14/2024   11:36 AM 09/10/2023    8:36 AM 02/27/2023    8:46 AM 02/09/2023    9:17 AM  Fall Risk   Falls in the past year? 0 0 0 0 0  Number falls in past yr: 0 0 0  0  Injury with Fall? 0 0 0  0  Risk for fall due to :  No Fall Risks No Fall Risks No Fall Risks No Fall Risks  Follow up Falls evaluation completed Falls prevention discussed;Falls evaluation completed Falls prevention discussed Falls prevention discussed;Education provided;Falls evaluation completed Falls prevention discussed;Education provided     Assessment & Plan Mitral valve prolapse with palpitations and exertional dyspnea Palpitations and exertional dyspnea managed with Cardizem . Holter monitor showed sinus rhythm with infrequent PACs and PVCs. Awaiting stress test results. - Continue Cardizem  120 mg daily. - Await stress test results.  Atherosclerosis of aorta Aortic atherosclerosis with high cholesterol and plaque. She declined cholesterol medication.  Osteoporosis of left radius and osteopenia of spine Osteoporosis in left radius and osteopenia in spine managed with Prolia and high calcium  diet. Next bone density scan in October 2025. - Continue Prolia every six months. - Ensure high calcium  diet. - Complete bone density scan in October 2025.  Migraine Intermittent migraines managed with Imitrex  as needed. - Continue Imitrex  as needed for  migraines.  Gastroesophageal reflux disease Reflux symptoms managed with lifestyle modifications and Tums. Dry cough and throat clearing may be related to reflux or allergies. -  Use Tums as needed. - Consider over-the-counter Prilosec if symptoms persist.  Constipation with risk of fecal incontinence Constipation managed with Miralax . Dosage adjustment may be needed to prevent fecal incontinence. - Adjust Miralax  dosage as needed to maintain regular bowel movements. - Consider prescription for Miralax  for cost-effectiveness.  Allergic rhinitis with postnasal drip and cough Seasonal allergic rhinitis with postnasal drip and dry cough, more pronounced in spring and fall. - Prescribe fluticasone  nasal spray. - Prescribe Xyzal for allergy management.  Vitamin D  deficiency Vitamin D  levels improved to normal range with current supplementation. - Continue current vitamin D  supplementation.  History of left ankle fracture and sprain Left ankle fracture and sprain resolved.  General Health Maintenance Encouraged physical activity to prevent falls and maintain muscle mass. Discussed weight training or physical therapy. - Encourage physical activity and weight training. - Consider physical therapy for fall prevention.

## 2024-09-22 ENCOUNTER — Ambulatory Visit
Admission: RE | Admit: 2024-09-22 | Discharge: 2024-09-22 | Disposition: A | Source: Ambulatory Visit | Attending: Family Medicine | Admitting: Family Medicine

## 2024-09-22 ENCOUNTER — Ambulatory Visit: Payer: Self-pay | Admitting: Family Medicine

## 2024-09-22 ENCOUNTER — Ambulatory Visit
Admission: RE | Admit: 2024-09-22 | Discharge: 2024-09-22 | Disposition: A | Discharge status: Home / Self Care | Source: Ambulatory Visit | Attending: Family Medicine | Admitting: Family Medicine

## 2024-09-22 DIAGNOSIS — Z1231 Encounter for screening mammogram for malignant neoplasm of breast: Secondary | ICD-10-CM | POA: Diagnosis present

## 2024-09-22 DIAGNOSIS — M816 Localized osteoporosis [Lequesne]: Secondary | ICD-10-CM | POA: Diagnosis present

## 2025-02-19 ENCOUNTER — Ambulatory Visit

## 2025-03-11 ENCOUNTER — Ambulatory Visit: Admitting: Family Medicine
